# Patient Record
Sex: Male | Born: 1985
Health system: Southern US, Community
[De-identification: ages and names within clinical notes are randomized; demographics above are authoritative.]

## PROBLEM LIST (undated history)

## (undated) DIAGNOSIS — I10 Essential (primary) hypertension: Secondary | ICD-10-CM

## (undated) DIAGNOSIS — F419 Anxiety disorder, unspecified: Secondary | ICD-10-CM

## (undated) DIAGNOSIS — E119 Type 2 diabetes mellitus without complications: Secondary | ICD-10-CM

## (undated) DIAGNOSIS — K219 Gastro-esophageal reflux disease without esophagitis: Secondary | ICD-10-CM

## (undated) DIAGNOSIS — R7303 Prediabetes: Secondary | ICD-10-CM

## (undated) HISTORY — DX: Type 2 diabetes mellitus without complications: E11.9

## (undated) HISTORY — DX: Gastro-esophageal reflux disease without esophagitis: K21.9

## (undated) HISTORY — DX: Anxiety disorder, unspecified: F41.9

## (undated) HISTORY — DX: Prediabetes: R73.03

## (undated) HISTORY — PX: FRACTURE SURGERY: SHX138

---
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
## 2005-12-22 ENCOUNTER — Emergency Department (HOSPITAL_COMMUNITY): Admission: EM | Admit: 2005-12-22 | Discharge: 2005-12-22 | Payer: Self-pay | Admitting: Emergency Medicine

## 2008-02-01 ENCOUNTER — Emergency Department (HOSPITAL_COMMUNITY): Admission: EM | Admit: 2008-02-01 | Discharge: 2008-02-01 | Payer: Self-pay | Admitting: Emergency Medicine

## 2009-04-08 ENCOUNTER — Emergency Department (HOSPITAL_COMMUNITY): Admission: EM | Admit: 2009-04-08 | Discharge: 2009-04-08 | Payer: Self-pay | Admitting: Family Medicine

## 2010-04-30 ENCOUNTER — Emergency Department (HOSPITAL_COMMUNITY): Admission: EM | Admit: 2010-04-30 | Discharge: 2010-04-30 | Payer: Self-pay | Admitting: Emergency Medicine

## 2010-09-07 LAB — URINALYSIS, ROUTINE W REFLEX MICROSCOPIC
Bilirubin Urine: NEGATIVE
Glucose, UA: NEGATIVE mg/dL
Hgb urine dipstick: NEGATIVE
Ketones, ur: NEGATIVE mg/dL
Nitrite: NEGATIVE
Protein, ur: NEGATIVE mg/dL
Specific Gravity, Urine: 1.028 (ref 1.005–1.030)
Urobilinogen, UA: 1 mg/dL (ref 0.0–1.0)
pH: 6 (ref 5.0–8.0)

## 2011-03-25 LAB — POCT URINALYSIS DIP (DEVICE)
Glucose, UA: NEGATIVE
Hgb urine dipstick: NEGATIVE
Ketones, ur: NEGATIVE
Nitrite: NEGATIVE
Operator id: 239701
Protein, ur: NEGATIVE
Specific Gravity, Urine: 1.02
Urobilinogen, UA: 1
pH: 6.5

## 2011-11-25 ENCOUNTER — Encounter (HOSPITAL_COMMUNITY): Payer: Self-pay

## 2011-11-25 ENCOUNTER — Emergency Department (HOSPITAL_COMMUNITY)
Admission: EM | Admit: 2011-11-25 | Discharge: 2011-11-25 | Disposition: A | Discharge status: Home Health | Payer: BC Managed Care – PPO | Source: Home / Self Care | Attending: Emergency Medicine | Admitting: Emergency Medicine

## 2011-11-25 DIAGNOSIS — L739 Follicular disorder, unspecified: Secondary | ICD-10-CM

## 2011-11-25 DIAGNOSIS — L738 Other specified follicular disorders: Secondary | ICD-10-CM

## 2011-11-25 DIAGNOSIS — L678 Other hair color and hair shaft abnormalities: Secondary | ICD-10-CM

## 2011-11-25 HISTORY — DX: Essential (primary) hypertension: I10

## 2011-11-25 MED ORDER — CEPHALEXIN 500 MG PO CAPS: 500.0000 mg | ORAL_CAPSULE | Freq: Four times a day (QID) | ORAL | Status: AC

## 2011-11-25 NOTE — ED Provider Notes (Signed)
History     CSN: [id number]  Arrival date & time 11/25/11  1612   First MD Initiated Contact with Patient 11/25/11 1636      Chief Complaint  Patient presents with  . Rash    (Consider location/radiation/quality/duration/timing/severity/associated sxs/prior treatment) HPI Comments: Patient reports a "bump" on the back of his head starting about a week and half ago after getting his haircut. States it is progressively getting bigger. He keeps his hair very close cut.  No nausea, vomiting, fevers, headache. No redness, drainage from the area. No other rash. He is otherwise healthy, with past history significant for hypertension.  ROS as noted in HPI. All other ROS negative.   Patient is a 26 y.o. male presenting with rash. The history is provided by the patient. No language interpreter was used.  Rash  This is a new problem. The current episode started more than 1 week ago. The problem has not changed since onset.Associated with: after a hairccut. There has been no fever. The rash is present on the scalp. The patient is experiencing no pain. Pertinent negatives include no blisters, no itching, no pain and no weeping. He has tried nothing for the symptoms. The treatment provided no relief.    Past Medical History  Diagnosis Date  . Hypertension   . Seasonal allergies     History reviewed. No pertinent past surgical history.  History reviewed. No pertinent family history.  History  Substance Use Topics  . Smoking status: Current Some Day Smoker  . Smokeless tobacco: Not on file  . Alcohol Use: Yes      Review of Systems  Skin: Positive for rash. Negative for itching.    Allergies  Review of patient's allergies indicates no known allergies.  Home Medications   Current Outpatient Rx  Name Route Sig Dispense Refill  . LISINOPRIL 10 MG PO TABS Oral Take 10 mg by mouth daily.    . CEPHALEXIN 500 MG PO CAPS Oral Take 1 capsule (500 mg total) by mouth 4 (four) times  daily. X 10 days 40 capsule 0    BP 136/73  Pulse 68  Temp(Src) 98.2 F (36.8 C) (Oral)  Resp 16  SpO2 100%  Physical Exam  Nursing note and vitals reviewed. Constitutional: He is oriented to person, place, and time. He appears well-developed and well-nourished.  HENT:  Head: Normocephalic and atraumatic.    Eyes: Conjunctivae and EOM are normal.  Neck: Normal range of motion.  Cardiovascular: Normal rate.   Pulmonary/Chest: Effort normal. No respiratory distress.  Abdominal: He exhibits no distension.  Musculoskeletal: Normal range of motion.  Lymphadenopathy:    He has no cervical adenopathy.       Right cervical: No posterior cervical adenopathy present.      Left cervical: No posterior cervical adenopathy present.  Neurological: He is alert and oriented to person, place, and time.  Skin: Skin is warm and dry.  Psychiatric: He has a normal mood and affect. His behavior is normal.    ED Course  Procedures (including critical care time)  Labs Reviewed - No data to display No results found.   1. Folliculitis     MDM  No evidence of cellulitis. H&P most consistent with a mild folliculitis. Will have him start warm compresses, bacitracin, Keflex. He'll follow up with Dr. Renae Gloss his PMD, PRN  Luiz Blare, MD 11/25/11 Rickey Primus

## 2011-11-25 NOTE — ED Notes (Signed)
Pt c/o bump to back of head.  Pt states onset 1.5 weeks ago.  Pt denies pain, drainage.

## 2011-11-25 NOTE — Discharge Instructions (Signed)
Folliculitis  °Folliculitis is an infection and inflammation of the hair follicles. Hair follicles become red and irritated. This inflammation is usually caused by bacteria. The bacteria thrive in warm, moist environments. This condition can be seen anywhere on the body.  °CAUSES °The most common cause of folliculitis is an infection by germs (bacteria). Fungal and viral infections can also cause the condition. Viral infections may be more common in people whose bodies are unable to fight disease well (weakened immune systems). Examples include people with: °· AIDS.  °· An organ transplant.  °· Cancer.  °People with depressed immune systems, diabetes, or obesity, have a greater risk of getting folliculitis than the general population. Certain chemicals, especially oils and tars, also can cause folliculitis. °SYMPTOMS °· An early sign of folliculitis is a small, white or yellow pus-filled, itchy lesion (pustule). These lesions appear on a red, inflamed follicle. They are usually less than 5 mm (.20 inches).  °· The most likely starting points are the scalp, thighs, legs, back and buttocks. Folliculitis is also frequently found in areas of repeated shaving.  °· When an infection of the follicle goes deeper, it becomes a boil or furuncle. A group of closely packed boils create a larger lesion (a carbuncle). These sores (lesions) tend to occur in hairy, sweaty areas of the body.  °TREATMENT  °· A doctor who specializes in skin problems (dermatologists) treats mild cases of folliculitis with antiseptic washes.  °· They also use a skin application which kills germs (topical antibiotics). Tea tree oil is a good topical antiseptic as well. It can be found at a health food store. A small percentage of individuals may develop an allergy to the tea tree oil.  °· Mild to moderate boils respond well to warm water compresses applied three times daily.  °· In some cases, oral antibiotics should be taken with the skin treatment.    °· If lesions contain large quantities of pus or fluid, your caregiver may drain them. This allows the topical antibiotics to get to the affected areas better.  °· Stubborn cases of folliculitis may respond to laser hair removal. This process uses a high intensity light beam (a laser) to destroy the follicle and reduces the scarring from folliculitis. After laser hair removal, hair will no longer grow in the laser treated area.  °Patients with long-lasting folliculitis need to find out where the infection is coming from. Germs can live in the nostrils of the patient. This can trigger an outbreak now and then. Sometimes the bacteria live in the nostrils of a family member. This person does not develop the disorder but they repeatedly re-expose others to the germ. To break the cycle of recurrence in the patient, the family member must also undergo treatment. °PREVENTION  °· Individuals who are predisposed to folliculitis should be extremely careful about personal hygiene.  °· Application of antiseptic washes may help prevent recurrences.  °· A topical antibiotic cream, mupirocin (Bactroban®), has been effective at reducing bacteria in the nostrils. It is applied inside the nose with your little finger. This is done twice daily for a week. Then it is repeated every 6 months.  °· Because follicle disorders tend to come back, patients must receive follow-up care. Your caregiver may be able to recognize a recurrence before it becomes severe.  °SEEK IMMEDIATE MEDICAL CARE IF:  °· You develop redness, swelling, or increasing pain in the area.  °· You have a fever.  °· You are not improving with treatment   or are getting worse.  °· You have any other questions or concerns.  °Document Released: 08/22/2001 Document Revised: 06/02/2011 Document Reviewed: 06/18/2008 °Exit[employer]® Patient Information ©2012 [employer]. °

## 2012-04-10 ENCOUNTER — Encounter (HOSPITAL_COMMUNITY): Payer: Self-pay | Admitting: Emergency Medicine

## 2012-04-10 ENCOUNTER — Emergency Department (INDEPENDENT_AMBULATORY_CARE_PROVIDER_SITE_OTHER)
Admission: EM | Admit: 2012-04-10 | Discharge: 2012-04-10 | Disposition: A | Discharge status: Home / Self Care | Payer: BC Managed Care – PPO | Source: Home / Self Care | Attending: Family Medicine | Admitting: Family Medicine

## 2012-04-10 DIAGNOSIS — J069 Acute upper respiratory infection, unspecified: Secondary | ICD-10-CM

## 2012-04-10 LAB — POCT RAPID STREP A: Streptococcus, Group A Screen (Direct): NEGATIVE

## 2012-04-10 MED ORDER — IPRATROPIUM BROMIDE 0.06 % NA SOLN: 2.0000 | Freq: Four times a day (QID) | NASAL | Status: DC

## 2012-04-10 NOTE — ED Notes (Signed)
Pt  Reports  Congested          Earache   As  Well         Symptoms              X   sev  Days          Pt  Appears  In  No  Acute  Distress        Symptoms  Not  releived by  otc  meds             Pt  Is  Awake  As  Well  As  Alert      As  Well

## 2012-04-10 NOTE — ED Provider Notes (Signed)
History     CSN: [id number]  Arrival date & time 04/10/12  0820   First MD Initiated Contact with Patient 04/10/12 [phone]      Chief Complaint  Patient presents with  . Cough    (Consider location/radiation/quality/duration/timing/severity/associated sxs/prior treatment) Patient is a 26 y.o. male presenting with cough. The history is provided by the patient.  Cough This is a new problem. The current episode started more than 2 days ago. The problem has not changed since onset.The cough is non-productive. Associated symptoms include rhinorrhea. Pertinent negatives include no chest pain, no chills, no myalgias, no shortness of breath and no wheezing. He is not a smoker.    Past Medical History  Diagnosis Date  . Hypertension   . Seasonal allergies     History reviewed. No pertinent past surgical history.  No family history on file.  History  Substance Use Topics  . Smoking status: Current Some Day Smoker  . Smokeless tobacco: Not on file  . Alcohol Use: Yes      Review of Systems  Constitutional: Negative for chills.  HENT: Positive for congestion, rhinorrhea and postnasal drip.   Respiratory: Positive for cough. Negative for shortness of breath and wheezing.   Cardiovascular: Negative for chest pain.  Gastrointestinal: Negative.   Musculoskeletal: Negative for myalgias.    Allergies  Review of patient's allergies indicates no known allergies.  Home Medications   Current Outpatient Rx  Name Route Sig Dispense Refill  . LISINOPRIL 10 MG PO TABS Oral Take 10 mg by mouth daily.    . IPRATROPIUM BROMIDE 0.06 % NA SOLN Nasal Place 2 sprays into the nose 4 (four) times daily. 15 mL 1    BP 145/88  Pulse 72  Temp 98.6 F (37 C) (Oral)  Resp 16  SpO2 99%  Physical Exam  Nursing note and vitals reviewed. Constitutional: He is oriented to person, place, and time. He appears well-developed and well-nourished.  HENT:  Head: Normocephalic.  Right Ear: External  ear normal.  Left Ear: External ear normal.  Nose: Mucosal edema and rhinorrhea present.  Mouth/Throat: Oropharynx is clear and moist.  Eyes: Pupils are equal, round, and reactive to light.  Neck: Normal range of motion. Neck supple.  Cardiovascular: Normal rate, normal heart sounds and intact distal pulses.   Pulmonary/Chest: Effort normal and breath sounds normal.  Neurological: He is alert and oriented to person, place, and time.  Skin: Skin is warm and dry.    ED Course  Procedures (including critical care time)   Labs Reviewed  POCT RAPID STREP A ([facility] ONLY)   No results found.   1. URI (upper respiratory infection)       MDM          Linna Hoff, MD 04/10/12 0900

## 2012-10-18 ENCOUNTER — Ambulatory Visit (INDEPENDENT_AMBULATORY_CARE_PROVIDER_SITE_OTHER): Payer: BC Managed Care – PPO | Admitting: Emergency Medicine

## 2012-10-18 VITALS — BP 128/70 | HR 70 | Temp 98.6°F | Resp 18 | Ht 70.0 in | Wt 252.0 lb

## 2012-10-18 DIAGNOSIS — R112 Nausea with vomiting, unspecified: Secondary | ICD-10-CM

## 2012-10-18 DIAGNOSIS — R197 Diarrhea, unspecified: Secondary | ICD-10-CM

## 2012-10-18 DIAGNOSIS — E860 Dehydration: Secondary | ICD-10-CM

## 2012-10-18 LAB — POCT CBC
Granulocyte percent: 86.7 % — AB (ref 37–80)
HCT, POC: 43.1 % — AB (ref 43.5–53.7)
Hemoglobin: 13.4 g/dL — AB (ref 14.1–18.1)
Lymph, poc: 0.6 (ref 0.6–3.4)
MCH, POC: 22 pg — AB (ref 27–31.2)
MCHC: 31.14 g/dL — AB (ref 31.8–35.4)
MCV: 70.9 fL — AB (ref 80–97)
MID (cbc): 0.2 (ref 0–0.9)
MPV: 9.3 fL (ref 0–99.8)
POC Granulocyte: 5.1 (ref 2–6.9)
POC LYMPH PERCENT: 10.1 % (ref 10–50)
POC MID %: 3.2 % (ref 0–12)
Platelet Count, POC: 295 K/uL (ref 142–424)
RBC: 6.08 M/uL (ref 4.69–6.13)
RDW, POC: 15 %
WBC: 5.9 K/uL (ref 4.6–10.2)

## 2012-10-18 MED ORDER — ONDANSETRON 4 MG PO TBDP
8.0000 mg | ORAL_TABLET | Freq: Once | ORAL | Status: AC
  Administered 2012-10-18: 8 mg via ORAL

## 2012-10-18 MED ORDER — ONDANSETRON 8 MG PO TBDP: 8.0000 mg | ORAL_TABLET | Freq: Three times a day (TID) | ORAL | Status: DC | PRN

## 2012-10-18 NOTE — Progress Notes (Signed)
  Subjective:    Patient ID: Ryan Rowe, male    DOB: 29-Apr-1986, 27 y.o.   MRN: [id number]  HPI 27 yo male with diarrhea and vomiting starting yesterday. Had diarrhea 4 times, all liquid no blood. Has vomiting a couple times, most recently here in the office. No blood in the vomit. Feels dizzy and weak. 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 Has not tried eating. Has not been able to keep liquids down. Has tried taking Prilosec and Tums, which did nothing.    Review of Systems  Constitutional: Positive for fatigue. Negative for fever and chills.  Gastrointestinal: Positive for nausea, vomiting, abdominal pain and diarrhea. Negative for blood in stool.       Objective:   Physical Exam  Constitutional: He is oriented to person, place, and time. He appears well-developed and well-nourished.  HENT:  Head: Normocephalic and atraumatic.  Cardiovascular: Normal rate, regular rhythm and normal heart sounds.   Pulmonary/Chest: Effort normal and breath sounds normal.  Abdominal: Soft. There is tenderness (worst in epigastric area). There is no rebound and no guarding.  Hyperactive bowel sounds  Neurological: He is alert and oriented to person, place, and time.  Skin: Skin is warm and dry.    Orthostatics: Laying: 128/82 P: 70           Sitting: 126/78 P: 75            Standing: 118/84 P: 87     Results for orders placed in visit on 10/18/12  POCT CBC      Result Value Range   WBC 5.9  4.6 - 10.2 K/uL   Lymph, poc 0.6  0.6 - 3.4   POC LYMPH PERCENT 10.1  10 - 50 %L   MID (cbc) 0.2  0 - 0.9   POC MID % 3.2  0 - 12 %M   POC Granulocyte 5.1  2 - 6.9   Granulocyte percent 86.7 (*) 37 - 80 %G   RBC 6.08  4.69 - 6.13 M/uL   Hemoglobin 13.4 (*) 14.1 - 18.1 g/dL   HCT, POC 32.9 (*) 51.8 - 53.7 %   MCV 70.9 (*) 80 - 97 fL   MCH, POC 22.0 (*) 27 - 31.2 pg   MCHC 31.14 (*) 31.8 - 35.4 g/dL   RDW, POC 84.1     Platelet Count, POC 295  142 - 424 K/uL   MPV 9.3  0 -  99.8 fL      Assessment & Plan:  27 yo  with 24 hours of nausea and vomiting. Likely viral gastroenteritis. Zofran and IV rehydration in the office.

## 2012-10-18 NOTE — Patient Instructions (Signed)

## 2013-06-10 ENCOUNTER — Ambulatory Visit (INDEPENDENT_AMBULATORY_CARE_PROVIDER_SITE_OTHER): Payer: BC Managed Care – PPO | Admitting: Physician Assistant

## 2013-06-10 VITALS — BP 120/78 | HR 84 | Temp 98.3°F | Resp 16 | Ht 69.5 in | Wt 252.0 lb

## 2013-06-10 DIAGNOSIS — J069 Acute upper respiratory infection, unspecified: Secondary | ICD-10-CM

## 2013-06-10 LAB — POCT INFLUENZA A/B
Influenza A, POC: NEGATIVE
Influenza B, POC: NEGATIVE

## 2013-06-10 MED ORDER — GUAIFENESIN ER 1200 MG PO TB12: 1.0000 | ORAL_TABLET | Freq: Two times a day (BID) | ORAL | Status: AC

## 2013-06-10 MED ORDER — HYDROCOD POLST-CHLORPHEN POLST 10-8 MG/5ML PO LQCR: 5.0000 mL | Freq: Two times a day (BID) | ORAL | Status: AC

## 2013-06-10 NOTE — Patient Instructions (Signed)
Push fluids - tylenol/motrin prn pain and headache.  Push fluids.  Do not take sudafed due to high blood pressure.

## 2013-06-10 NOTE — Progress Notes (Signed)
   Subjective:    Patient ID: Ryan Rowe, male    DOB: July 14, 1985, 27 y.o.   MRN: [id number]  HPI Pt presents to clinic with 24h h/o cold symptoms.  He has a productive cough with nasal congestion and PND but no rhinorrhea.  He has headaches and myalgias.    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  HENT: Positive for congestion, postnasal drip and sore throat (mild). Negative for rhinorrhea.   Respiratory: Positive for cough (productive cough with yellow sputum). Negative for shortness of breath and wheezing.   Gastrointestinal: Negative for nausea, vomiting and diarrhea.  Musculoskeletal: Positive for myalgias.  Neurological: Positive for headaches.       Objective:   Physical Exam  Vitals reviewed. Constitutional: He is oriented to person, place, and time. He appears well-developed and well-nourished.  HENT:  Head: Normocephalic and atraumatic.  Right Ear: Hearing, tympanic membrane, external ear and ear canal normal.  Left Ear: Hearing, tympanic membrane, external ear and ear canal normal.  Nose: Mucosal edema (red) present.  Mouth/Throat: Uvula is midline, oropharynx is clear and moist and mucous membranes are normal. No posterior oropharyngeal edema or posterior oropharyngeal erythema.  Eyes: Conjunctivae are normal.  Cardiovascular: Normal rate, regular rhythm and normal heart sounds.   No murmur heard. Pulmonary/Chest: Effort normal and breath sounds normal.  Neurological: He is alert and oriented to person, place, and time.  Skin: Skin is warm and dry.  Psychiatric: He has a normal mood and affect. His behavior is normal. Judgment and thought content normal.       Assessment & Plan:  Viral URI with cough - Plan: Guaifenesin (MUCINEX MAXIMUM STRENGTH) 1200 MG TB12, chlorpheniramine-HYDROcodone (TUSSIONEX PENNKINETIC ER) 10-8 MG/5ML LQCR, POCT  Influenza A/B   Benny Lennert PA-C 06/10/2013 12:02 PM

## 2013-06-11 ENCOUNTER — Telehealth: Payer: Self-pay

## 2013-06-11 ENCOUNTER — Emergency Department (HOSPITAL_COMMUNITY): Payer: BC Managed Care – PPO

## 2013-06-11 ENCOUNTER — Emergency Department (HOSPITAL_COMMUNITY)
Admission: EM | Admit: 2013-06-11 | Discharge: 2013-06-11 | Disposition: A | Discharge status: Home / Self Care | Payer: BC Managed Care – PPO | Attending: Emergency Medicine | Admitting: Emergency Medicine

## 2013-06-11 ENCOUNTER — Encounter (HOSPITAL_COMMUNITY): Payer: Self-pay | Admitting: Emergency Medicine

## 2013-06-11 ENCOUNTER — Ambulatory Visit (INDEPENDENT_AMBULATORY_CARE_PROVIDER_SITE_OTHER): Payer: BC Managed Care – PPO | Admitting: Family Medicine

## 2013-06-11 VITALS — BP 116/75 | HR 94 | Temp 100.5°F | Resp 18 | Ht 69.5 in | Wt 252.0 lb

## 2013-06-11 DIAGNOSIS — R110 Nausea: Secondary | ICD-10-CM | POA: Insufficient documentation

## 2013-06-11 DIAGNOSIS — Z79899 Other long term (current) drug therapy: Secondary | ICD-10-CM | POA: Insufficient documentation

## 2013-06-11 DIAGNOSIS — I10 Essential (primary) hypertension: Secondary | ICD-10-CM | POA: Insufficient documentation

## 2013-06-11 DIAGNOSIS — J111 Influenza due to unidentified influenza virus with other respiratory manifestations: Secondary | ICD-10-CM | POA: Insufficient documentation

## 2013-06-11 DIAGNOSIS — R55 Syncope and collapse: Secondary | ICD-10-CM

## 2013-06-11 DIAGNOSIS — K219 Gastro-esophageal reflux disease without esophagitis: Secondary | ICD-10-CM | POA: Insufficient documentation

## 2013-06-11 DIAGNOSIS — R404 Transient alteration of awareness: Secondary | ICD-10-CM | POA: Insufficient documentation

## 2013-06-11 DIAGNOSIS — F17200 Nicotine dependence, unspecified, uncomplicated: Secondary | ICD-10-CM | POA: Insufficient documentation

## 2013-06-11 DIAGNOSIS — J029 Acute pharyngitis, unspecified: Secondary | ICD-10-CM | POA: Insufficient documentation

## 2013-06-11 LAB — URINALYSIS, ROUTINE W REFLEX MICROSCOPIC
Bilirubin Urine: NEGATIVE
Glucose, UA: NEGATIVE mg/dL
Hgb urine dipstick: NEGATIVE
Ketones, ur: NEGATIVE mg/dL
Leukocytes, UA: NEGATIVE
Nitrite: NEGATIVE
Protein, ur: NEGATIVE mg/dL
Specific Gravity, Urine: 1.023 (ref 1.005–1.030)
Urobilinogen, UA: 0.2 mg/dL (ref 0.0–1.0)
pH: 6 (ref 5.0–8.0)

## 2013-06-11 LAB — COMPREHENSIVE METABOLIC PANEL
ALT: 19 U/L (ref 0–53)
AST: 19 U/L (ref 0–37)
Albumin: 3.9 g/dL (ref 3.5–5.2)
Alkaline Phosphatase: 48 U/L (ref 39–117)
BUN: 15 mg/dL (ref 6–23)
CO2: 26 mEq/L (ref 19–32)
Calcium: 9 mg/dL (ref 8.4–10.5)
Chloride: 97 mEq/L (ref 96–112)
Creatinine, Ser: 1.12 mg/dL (ref 0.50–1.35)
GFR calc Af Amer: >90 mL/min (ref >90)
GFR calc non Af Amer: 89 mL/min — ABNORMAL LOW (ref >90)
Glucose, Bld: 110 mg/dL — ABNORMAL HIGH (ref 70–99)
Potassium: 4 mEq/L (ref 3.5–5.1)
Sodium: 134 mEq/L — ABNORMAL LOW (ref 135–145)
Total Bilirubin: 0.3 mg/dL (ref 0.3–1.2)
Total Protein: 7.4 g/dL (ref 6.0–8.3)

## 2013-06-11 LAB — CBC WITH DIFFERENTIAL/PLATELET
Basophils Absolute: 0 10*3/uL (ref 0.0–0.1)
Basophils Relative: 0 % (ref 0–1)
Eosinophils Absolute: 0 10*3/uL (ref 0.0–0.7)
Eosinophils Relative: 0 % (ref 0–5)
HCT: 39.7 % (ref 39.0–52.0)
Hemoglobin: 12.6 g/dL — ABNORMAL LOW (ref 13.0–17.0)
Lymphocytes Relative: 10 % — ABNORMAL LOW (ref 12–46)
Lymphs Abs: 0.6 10*3/uL — ABNORMAL LOW (ref 0.7–4.0)
MCH: 21.8 pg — ABNORMAL LOW (ref 26.0–34.0)
MCHC: 31.7 g/dL (ref 30.0–36.0)
MCV: 68.7 fL — ABNORMAL LOW (ref 78.0–100.0)
Monocytes Absolute: 0.8 10*3/uL (ref 0.1–1.0)
Monocytes Relative: 13 % — ABNORMAL HIGH (ref 3–12)
Neutro Abs: 5 10*3/uL (ref 1.7–7.7)
Neutrophils Relative %: 77 % (ref 43–77)
Platelets: 195 10*3/uL (ref 150–400)
RBC: 5.78 MIL/uL (ref 4.22–5.81)
RDW: 14.3 % (ref 11.5–15.5)
WBC: 6.4 10*3/uL (ref 4.0–10.5)

## 2013-06-11 LAB — GLUCOSE, POCT (MANUAL RESULT ENTRY): POC Glucose: 107 mg/dl — AB (ref 70–99)

## 2013-06-11 MED ORDER — KETOROLAC TROMETHAMINE 30 MG/ML IJ SOLN
30.0000 mg | Freq: Once | INTRAMUSCULAR | Status: AC
  Administered 2013-06-11: 30 mg via INTRAVENOUS
  Filled 2013-06-11: qty 1

## 2013-06-11 MED ORDER — IBUPROFEN 800 MG PO TABS: 800.0000 mg | ORAL_TABLET | Freq: Three times a day (TID) | ORAL | Status: DC | PRN

## 2013-06-11 MED ORDER — ACETAMINOPHEN-CODEINE 120-12 MG/5ML PO SOLN: 10.0000 mL | ORAL | Status: DC | PRN

## 2013-06-11 MED ORDER — SODIUM CHLORIDE 0.9 % IV BOLUS (SEPSIS)
2000.0000 mL | Freq: Once | INTRAVENOUS | Status: AC
  Administered 2013-06-11: 2000 mL via INTRAVENOUS

## 2013-06-11 MED ORDER — ACETAMINOPHEN 325 MG PO TABS
650.0000 mg | ORAL_TABLET | Freq: Once | ORAL | Status: AC
  Administered 2013-06-11: 650 mg via ORAL
  Filled 2013-06-11: qty 2

## 2013-06-11 MED ORDER — GUAIFENESIN ER 1200 MG PO TB12: 1.0000 | ORAL_TABLET | Freq: Two times a day (BID) | ORAL | Status: DC

## 2013-06-11 NOTE — Progress Notes (Addendum)
[facility] [address] [place] [address] [phone]  Date:  06/11/2013   Name:  Ryan Rowe   DOB:  October 13, 1985   MRN:  [id number]  PCP:  No primary provider on file.    Chief Complaint: Cough, Fever and Fatigue   History of Present Illness:  Ryan Rowe is a 27 y.o. very pleasant male patient who presents with the following:  Brought back urgently after LOC in the waiting room.  No seizure activity observed by staff.   He was found somnolent and unresponsive but sitting in a chair.  Sternal rub caused him to wake up and follow some commands.  Transferred back to room, started IV in right [facility] with NS wide open. Glucose check 107.    Once supine he felt better and responded, was able to talk.  Reports he is not really sure what happened, he just felt "like I was dreaming" and the next thin he knew he was laying down on exam table.  He was sweaty.  Denies any chest pain.  He has been ill with cough and fever recently.    Noted to have pulse of 55, BP approx 100/60 on initial exam in room Patient Active Problem List   Diagnosis Date Noted  . HTN (hypertension) 06/10/2013    Past Medical History  Diagnosis Date  . Hypertension   . Seasonal allergies   . GERD (gastroesophageal reflux disease)     Past Surgical History  Procedure Laterality Date  . Fracture surgery      History  Substance Use Topics  . Smoking status: Current Some Day Smoker  . Smokeless tobacco: Not on file  . Alcohol Use: Yes    No family history on file.  No Known Allergies  Medication list has been reviewed and updated.  Current Outpatient Prescriptions on File Prior to Visit  Medication Sig Dispense Refill  . chlorpheniramine-HYDROcodone (TUSSIONEX PENNKINETIC ER) 10-8 MG/5ML LQCR Take 5 mLs by mouth every 12 (twelve) hours.  70 mL  0  . Guaifenesin (MUCINEX MAXIMUM STRENGTH) 1200 MG TB12 Take 1 tablet (1,200 mg total) by mouth 2 (two) times daily.  14 each  0  .  lisinopril (PRINIVIL,ZESTRIL) 10 MG tablet Take 10 mg by mouth daily.      Marland Kitchen omeprazole (PRILOSEC) 20 MG capsule Take 20 mg by mouth daily.       No current facility-administered medications on file prior to visit.    Review of Systems:  As per HPI- otherwise negative.   Physical Examination: Filed Vitals:   06/11/13 1648  BP: 116/75  Pulse: 94  Temp: 100.5 F (38.1 C)  Resp: 18   Filed Vitals:   06/11/13 1648  Height: 5' 9.5" (1.765 m)  Weight: 252 lb (114.306 kg)   Body mass index is 36.69 kg/(m^2). Ideal Body Weight: Weight in (lb) to have BMI = 25: 171.4  GEN: WDWN, NAD, Non-toxic, Alert and oriented once supine, overweight, sweating HEENT: Atraumatic, Normocephalic. Neck supple. No masses, No LAD. Ears and Nose: No external deformity. CV: RRR, No M/G/R. No JVD. No thrill. No extra heart sounds. PULM: CTA B, no wheezes, crackles, rhonchi. No retractions. No resp. distress. No accessory muscle use. ABD: S, NT, ND. No rebound. No HSM. EXTR: No c/c/e NEURO moved in [facility] PSYCH: as per HPI    Assessment and Plan: Syncope  Syncope and collapse, likely due to bradycardia/ hypotension, perhaps related to illness.  EMS called immediately  for transfer to hospital for further evaluation and hydration    12 lead EKG shows NSR with rate of 79 per EMS.    Signed Abbe Amsterdam, MD

## 2013-06-11 NOTE — Addendum Note (Signed)
Addended by: Johnnette Litter on: 06/11/2013 06:03 PM   Modules accepted: Orders

## 2013-06-11 NOTE — ED Notes (Signed)
Bed: WA21 Expected date:  Expected time:  Means of arrival:  Comments: EMS-N/V-cold symptoms

## 2013-06-11 NOTE — ED Provider Notes (Signed)
CSN: [id number]     Arrival date & time 06/11/13  1803 History   First MD Initiated Contact with Patient 06/11/13 1816     Chief Complaint  Patient presents with  . Nausea  . Loss of Consciousness   (Consider location/radiation/quality/duration/timing/severity/associated sxs/prior Treatment) HPI Patient presents to the emergency department following a brief syncopal episode that occurred while he was at an urgent care center.  The patient, states, that he's had cough, runny nose, sore throat, and body aches, and fever.  The patient, states she's not had chest pain, shortness of breath, nausea, vomiting, diarrhea, back pain, neck pain, rash, blurred vision, dizziness, or weakness.  Patient, states, that he did have some mild nausea, and sweating, right before he passed out.  Patient, states he tried some over-the-counter cough, and cold remedy with minimal relief. Past Medical History  Diagnosis Date  . Hypertension   . Seasonal allergies   . GERD (gastroesophageal reflux disease)    Past Surgical History  Procedure Laterality Date  . Fracture surgery     No family history on file. History  Substance Use Topics  . Smoking status: Current Some Day Smoker  . Smokeless tobacco: Not on file  . Alcohol Use: Yes    Review of Systems All other systems negative except as documented in the HPI. All pertinent positives and negatives as reviewed in the HPI. Allergies  Review of patient's allergies indicates no known allergies.  Home Medications   Current Outpatient Rx  Name  Route  Sig  Dispense  Refill  . chlorpheniramine-HYDROcodone (TUSSIONEX PENNKINETIC ER) 10-8 MG/5ML LQCR   Oral   Take 5 mLs by mouth every 12 (twelve) hours.   70 mL   0   . Guaifenesin (MUCINEX MAXIMUM STRENGTH) 1200 MG TB12   Oral   Take 1 tablet (1,200 mg total) by mouth 2 (two) times daily.   14 each   0   . lisinopril (PRINIVIL,ZESTRIL) 10 MG tablet   Oral   Take 10 mg by mouth daily.         Marland Kitchen  omeprazole (PRILOSEC) 20 MG capsule   Oral   Take 20 mg by mouth daily.          BP 122/74  Pulse 88  Temp(Src) 100.2 F (37.9 C) (Oral)  Resp 19  SpO2 100% Physical Exam  Nursing note and vitals reviewed. Constitutional: He is oriented to person, place, and time. He appears well-developed and well-nourished. No distress.  HENT:  Head: Normocephalic and atraumatic.  Mouth/Throat: Oropharynx is clear and moist. No oropharyngeal exudate.  Eyes: EOM are normal. Pupils are equal, round, and reactive to light.  Neck: Normal range of motion. Neck supple.  Cardiovascular: Normal rate, regular rhythm and normal heart sounds.  Exam reveals no gallop and no friction rub.   No murmur heard. Pulmonary/Chest: Effort normal and breath sounds normal. No respiratory distress.  Abdominal: Soft. Bowel sounds are normal. He exhibits no distension. There is no tenderness. There is no rebound and no guarding.  Musculoskeletal: He exhibits no edema.  Neurological: He is alert and oriented to person, place, and time. He exhibits normal muscle tone. Coordination normal.  Skin: Skin is warm and dry. No rash noted.    ED Course  Procedures (including critical care time) Labs Review Labs Reviewed  CBC WITH DIFFERENTIAL - Abnormal; Notable for the following:    Hemoglobin 12.6 (*)    MCV 68.7 (*)    MCH 21.8 (*)  Lymphocytes Relative 10 (*)    Monocytes Relative 13 (*)    Lymphs Abs 0.6 (*)    All other components within normal limits  COMPREHENSIVE METABOLIC PANEL - Abnormal; Notable for the following:    Sodium 134 (*)    Glucose, Bld 110 (*)    GFR calc non Af Amer 89 (*)    All other components within normal limits  URINALYSIS, ROUTINE W REFLEX MICROSCOPIC   Imaging Review Dg Chest 2 View  06/11/2013   CLINICAL DATA:  Cough.  EXAM: CHEST  2 VIEW  COMPARISON:  None.  FINDINGS: The heart size and mediastinal contours are within normal limits. Both lungs are clear. The visualized skeletal  structures are unremarkable.  IMPRESSION: No active cardiopulmonary disease.   Electronically Signed   By: Myles Rosenthal M.D.   On: 06/11/2013 19:27    EKG Interpretation    Date/Time:  Tuesday June 11 2013 18:23:33 EST Ventricular Rate:  83 PR Interval:  191 QRS Duration: 80 QT Interval:  327 QTC Calculation: 384 R Axis:   68 Text Interpretation:  Sinus rhythm No significant change since last tracing Confirmed by HORTON  MD, Toni Amend ([id number]) on 06/11/2013 6:45:42 PM           Patient's syncope is most likely due to his viral influenza-like illness.  Patient does not have any significant lab abnormalities.  Patient will be treated for this influenza-like illness.  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   Patient is given his laboratory test results, and all questions were answered.  The patient is advised not to return to work until he has no fever, any longer.  Carlyle Dolly, PA-C 06/11/13 2106

## 2013-06-11 NOTE — ED Notes (Signed)
Per EMS: pt was at [facility] for cough, had syncopal episode lasting about about 2 mins. Denies pain, states he feels dizzy and drained.

## 2013-06-11 NOTE — ED Provider Notes (Signed)
Medical screening examination/treatment/procedure(s) were performed by non-physician practitioner and as supervising physician I was immediately available for consultation/collaboration.  EKG Interpretation    Date/Time:  Tuesday June 11 2013 18:23:33 EST Ventricular Rate:  83 PR Interval:  191 QRS Duration: 80 QT Interval:  327 QTC Calculation: 384 R Axis:   68 Text Interpretation:  Sinus rhythm No significant change since last tracing Confirmed by Wilkie Aye  MD, Zakiya Sporrer ([id number]) on 06/11/2013 6:45:42 PM             Shon Baton, MD 06/11/13 2329

## 2013-06-17 ENCOUNTER — Ambulatory Visit (INDEPENDENT_AMBULATORY_CARE_PROVIDER_SITE_OTHER): Payer: BC Managed Care – PPO | Admitting: Emergency Medicine

## 2013-06-17 VITALS — BP 132/90 | HR 78 | Temp 98.0°F | Resp 16 | Ht 70.0 in | Wt 254.0 lb

## 2013-06-17 DIAGNOSIS — R55 Syncope and collapse: Secondary | ICD-10-CM

## 2013-06-17 DIAGNOSIS — R51 Headache: Secondary | ICD-10-CM

## 2013-06-17 NOTE — Progress Notes (Signed)
   Subjective:    Patient ID: Ryan Rowe, male    DOB: Jan 27, 1986, 27 y.o.   MRN: [id number]  HPI    Review of Systems     Objective:   Physical Exam        Assessment & Plan:

## 2013-06-17 NOTE — Progress Notes (Signed)
Subjective:    Patient ID: Ryan Rowe, male    DOB: March 21, 1986, 27 y.o.   MRN: [id number]  HPI  This chart was scribed for Ryan Spare A. Cleta Alberts, MD, by Ellin Mayhew, ED Scribe. This patient was seen in room 11 and the patient's care was started at 2:07 PM.  HPI Comments: Ryan Rowe is a 27 y.o. male who presents to the [facility] complaining of constant HA that began six days ago. He rates his pain currently as a 4/10. He has had chills at night and intermittent cough. He denies any fever. The patient was last seen on 06/11/2013 for flu-like symptoms and a syncopal episode. He recovered quickly after receiving fluids. He states he has been feeling much better overall since feeling sick with the cold eight days ago. Patient has been having difficulty going to work due to his HAs and was requested to f/u with a physician prior to returning, due to his syncopal episode. He denies any head trauma. He is currently taking ibuprofen and acetaminophen. Patient has a history of HTN and seasonal allergies.   Past Medical History  Diagnosis Date  . Hypertension   . Seasonal allergies   . GERD (gastroesophageal reflux disease)     Past Surgical History  Procedure Laterality Date  . Fracture surgery      History reviewed. No pertinent family history.  History   Social History  . Marital Status: Single    Spouse Name: N/A    Number of Children: N/A  . Years of Education: N/A   Occupational History  . Not on file.   Social History Main Topics  . Smoking status: Never Smoker   . Smokeless tobacco: Not on file  . Alcohol Use: Yes  . Drug Use: No  . Sexual Activity: Yes   Other Topics Concern  . Not on file   Social History Narrative  . No narrative on file    No Known Allergies  Patient Active Problem List   Diagnosis Date Noted  . HTN (hypertension) 06/10/2013    Results for orders placed during the hospital encounter of 06/11/13  CBC WITH DIFFERENTIAL     Result Value Range   WBC 6.4  4.0 - 10.5 K/uL   RBC 5.78  4.22 - 5.81 MIL/uL   Hemoglobin 12.6 (*) 13.0 - 17.0 g/dL   HCT 40.9  81.1 - 91.4 %   MCV 68.7 (*) 78.0 - 100.0 fL   MCH 21.8 (*) 26.0 - 34.0 pg   MCHC 31.7  30.0 - 36.0 g/dL   RDW 78.2  95.6 - 21.3 %   Platelets 195  150 - 400 K/uL   Neutrophils Relative % 77  43 - 77 %   Lymphocytes Relative 10 (*) 12 - 46 %   Monocytes Relative 13 (*) 3 - 12 %   Eosinophils Relative 0  0 - 5 %   Basophils Relative 0  0 - 1 %   Neutro Abs 5.0  1.7 - 7.7 K/uL   Lymphs Abs 0.6 (*) 0.7 - 4.0 K/uL   Monocytes Absolute 0.8  0.1 - 1.0 K/uL   Eosinophils Absolute 0.0  0.0 - 0.7 K/uL   Basophils Absolute 0.0  0.0 - 0.1 K/uL   Smear Review MORPHOLOGY UNREMARKABLE    COMPREHENSIVE METABOLIC PANEL      Result Value Range   Sodium 134 (*) 135 - 145 mEq/L   Potassium 4.0  3.5 - 5.1  mEq/L   Chloride 97  96 - 112 mEq/L   CO2 26  19 - 32 mEq/L   Glucose, Bld 110 (*) 70 - 99 mg/dL   BUN 15  6 - 23 mg/dL   Creatinine, Ser 9.60  0.50 - 1.35 mg/dL   Calcium 9.0  8.4 - 45.4 mg/dL   Total Protein 7.4  6.0 - 8.3 g/dL   Albumin 3.9  3.5 - 5.2 g/dL   AST 19  0 - 37 U/L   ALT 19  0 - 53 U/L   Alkaline Phosphatase 48  39 - 117 U/L   Total Bilirubin 0.3  0.3 - 1.2 mg/dL   GFR calc non Af Amer 89 (*) >90 mL/min   GFR calc Af Amer >90  >90 mL/min  URINALYSIS, ROUTINE W REFLEX MICROSCOPIC      Result Value Range   Color, Urine YELLOW  YELLOW   APPearance CLEAR  CLEAR   Specific Gravity, Urine 1.023  1.005 - 1.030   pH 6.0  5.0 - 8.0   Glucose, UA NEGATIVE  NEGATIVE mg/dL   Hgb urine dipstick NEGATIVE  NEGATIVE   Bilirubin Urine NEGATIVE  NEGATIVE   Ketones, ur NEGATIVE  NEGATIVE mg/dL   Protein, ur NEGATIVE  NEGATIVE mg/dL   Urobilinogen, UA 0.2  0.0 - 1.0 mg/dL   Nitrite NEGATIVE  NEGATIVE   Leukocytes, UA NEGATIVE  NEGATIVE    No diagnosis found.  No orders of the defined types were placed in this encounter.    BP 132/90  Pulse 78   Temp(Src) 98 F (36.7 C) (Oral)  Resp 16  Ht 5\' 10"  (1.778 m)  Wt 254 lb (115.214 kg)  BMI 36.45 kg/m2  SpO2 97%   Review of Systems  Constitutional: Positive for chills. Negative for fever.  Respiratory: Positive for cough.   Neurological: Positive for headaches.   A complete 10 system review of systems was obtained and all systems are negative except as noted in the HPI and PMH.    Objective:   Physical Exam  Nursing note and vitals reviewed. Constitutional: He is oriented to person, place, and time. He appears well-developed and well-nourished.  HENT:  Head: Normocephalic and atraumatic.  Eyes: Conjunctivae are normal. Right eye exhibits no discharge. Left eye exhibits no discharge.  Neck: Normal range of motion.  Cardiovascular: Normal rate.   Pulmonary/Chest: Effort normal. No respiratory distress.  Neurological: He is alert and oriented to person, place, and time.  Skin: Skin is warm and dry.  Psychiatric: He has a normal mood and affect. His behavior is normal.      Assessment & Plan:   Patient looks good he still has a headache. We'll leave him out of work for a few more days

## 2013-09-05 NOTE — Telephone Encounter (Signed)
ERROR

## 2014-02-07 ENCOUNTER — Telehealth: Payer: Self-pay | Admitting: Medical

## 2014-02-07 NOTE — Telephone Encounter (Signed)
DONE

## 2014-02-07 NOTE — Telephone Encounter (Signed)
New pt records received from [facility]. Sending  back for review.

## 2014-02-10 ENCOUNTER — Ambulatory Visit: Payer: BC Managed Care – PPO | Admitting: Medical

## 2014-02-21 ENCOUNTER — Encounter: Payer: Self-pay | Admitting: Medical

## 2014-02-21 ENCOUNTER — Ambulatory Visit (INDEPENDENT_AMBULATORY_CARE_PROVIDER_SITE_OTHER): Payer: BC Managed Care – PPO | Admitting: Medical

## 2014-02-21 VITALS — BP 122/70 | HR 60 | Temp 97.9°F | Resp 16 | Ht 71.0 in | Wt 265.0 lb

## 2014-02-21 DIAGNOSIS — R0989 Other specified symptoms and signs involving the circulatory and respiratory systems: Secondary | ICD-10-CM

## 2014-02-21 DIAGNOSIS — G4710 Hypersomnia, unspecified: Secondary | ICD-10-CM

## 2014-02-21 DIAGNOSIS — K219 Gastro-esophageal reflux disease without esophagitis: Secondary | ICD-10-CM

## 2014-02-21 DIAGNOSIS — Z23 Encounter for immunization: Secondary | ICD-10-CM

## 2014-02-21 DIAGNOSIS — R400 Somnolence: Secondary | ICD-10-CM

## 2014-02-21 DIAGNOSIS — R0683 Snoring: Secondary | ICD-10-CM

## 2014-02-21 DIAGNOSIS — R0609 Other forms of dyspnea: Secondary | ICD-10-CM

## 2014-02-21 DIAGNOSIS — G478 Other sleep disorders: Secondary | ICD-10-CM

## 2014-02-21 DIAGNOSIS — R7301 Impaired fasting glucose: Secondary | ICD-10-CM

## 2014-02-21 DIAGNOSIS — I10 Essential (primary) hypertension: Secondary | ICD-10-CM

## 2014-02-21 NOTE — Progress Notes (Signed)
   Subjective:    Patient ID: Ryan Rowe, male    DOB: 10/10/1985, 28 y.o.   MRN: [id number]  HPI  New patient today.   Was seeing Urgent Care prior.   Referred by Dr. Cleta Alberts.  He reports hx/o HTN x 3 years.  Suppose to be on Lisinopril  currently.  Not taking any BP meds for last 2-3 months.  Has been on medication most of the 3 years he has had the diagnosis.   At diagnosis was not eating healthy, but has much improved on this.  Exercises regularly, eats healthy.   On medication for GERD which works well.    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  Hx/o impaired fasting glucose.   Review of Systems     Objective:   Physical Exam  BP 122/70  Pulse 60  Temp(Src) 97.9 F (36.6 C) (Oral)  Resp 16  Ht  (1.803 m)  Wt 265 lb (120.203 kg)  BMI 36.98 kg/m2  General appearance: alert, no distress, WD/WN, muscular AA male Oral cavity: MMM, no lesions Neck: supple, no lymphadenopathy, no thyromegaly, no masses Heart: RRR, normal S1, S2, no murmurs Lungs: CTA bilaterally, no wheezes, rhonchi, or rales Abdomen: +bs, soft, non tender, non distended, no masses, no hepatomegaly, no splenomegaly Pulses: 2+ symmetric, upper and lower extremities, normal cap refill Ext: no edema Neuro: nonfocal exam      Assessment & Plan:   Encounter Diagnoses  Name Primary?  . Essential hypertension, benign Yes  . Impaired fasting blood sugar   . Gastroesophageal reflux disease without esophagitis   . Need for prophylactic vaccination and inoculation against influenza   . Snoring   . Daytime somnolence   . Non-restorative sleep    HTN - hx/o, but normal BP today, and he has been out of medication x 5mo.  He will keep a BP diary and return this in 67mo.   Labs today.  Discussed causes of hypertension, secondary HTN, complications, treatment including healthy diet, exercise, salt avoidance, and may need to reconsider medication if BPs  staying elevated in 67mo.  Impaired fasting glucose - hx/o. Labs today  GERD - c/t current medication  Counseled on the influenza virus vaccine.  Vaccine information sheet given.  Influenza vaccine given after consent obtained.  Snoring, daytime somnolence, non restorative sleep - he will complete Epworth questionnaire.   Consider sleep study going forward.

## 2014-02-21 NOTE — Patient Instructions (Signed)
Thank you for giving me the opportunity to serve you today.    Your diagnosis today includes: Encounter Diagnoses  Name Primary?  . Essential hypertension, benign Yes  . Need for prophylactic vaccination and inoculation against influenza   . Impaired fasting blood sugar   . Gastroesophageal reflux disease without esophagitis      Specific recommendations today include:  Check your blood pressure readings for the next month and get these back to me  The goal blood pressure is around 120/80.  If you consistently see the top number over 140, or the bottom number over 90, then this is too high, needing medication to control.  we will call with lab results  Work on getting good water intake, healthy diet, routine exercise, and good sleep  Please schedule a visit with an eye doctor for baseline screening   I have included other useful information below for your review.   Ophthalmology Dr. Glenford Peers [address] [place], [place] [address] [phone]   [facility] Dr. Gelene Mink [address] [place] [place], [place] [address]  [phone] [email or web address]   Vincenza Hews, M.D. Susanne Greenhouse, O.D. [address], [address] [place], [place] [address] Medical telephone: [phone] Optical telephone: [phone]   Hypertension Hypertension, commonly called high blood pressure, is when the force of blood pumping through your arteries is too strong. Your arteries are the blood vessels that carry blood from your heart throughout your body. A blood pressure reading consists of a higher number over a lower number, such as 110/72. The higher number (systolic) is the pressure inside your arteries when your heart pumps. The lower number (diastolic) is the pressure inside your arteries when your heart relaxes. Ideally you want your blood pressure below 120/80. Hypertension forces your heart to work harder to pump blood. Your arteries may become  narrow or stiff. Having hypertension puts you at risk for heart disease, stroke, and other problems.  RISK FACTORS Some risk factors for high blood pressure are controllable. Others are not.  Risk factors you cannot control include:   Race. You may be at higher risk if you are African American.  Age. Risk increases with age.  Gender. Men are at higher risk than women before age 26 years. After age 14, women are at higher risk than men. Risk factors you can control include:  Not getting enough exercise or physical activity.  Being overweight.  Getting too much fat, sugar, calories, or salt in your diet.  Drinking too much alcohol. SIGNS AND SYMPTOMS Hypertension does not usually cause signs or symptoms. Extremely high blood pressure (hypertensive crisis) may cause headache, anxiety, shortness of breath, and nosebleed. DIAGNOSIS  To check if you have hypertension, your health care provider will measure your blood pressure while you are seated, with your arm held at the level of your heart. It should be measured at least twice using the same arm. Certain conditions can cause a difference in blood pressure between your right and left arms. A blood pressure reading that is higher than normal on one occasion does not mean that you need treatment. If one blood pressure reading is high, ask your health care provider about having it checked again. TREATMENT  Treating high blood pressure includes making lifestyle changes and possibly taking medicine. Living a healthy lifestyle can help lower high blood pressure. You may need to change some of your habits. Lifestyle changes may include:  Following the DASH diet. This diet  is high in fruits, vegetables, and whole grains. It is low in salt, red meat, and added sugars.  Getting at least 2 hours of brisk physical activity every week.  Losing weight if necessary.  Not smoking.  Limiting alcoholic beverages.  Learning ways to reduce stress. If  lifestyle changes are not enough to get your blood pressure under control, your health care provider may prescribe medicine. You may need to take more than one. Work closely with your health care provider to understand the risks and benefits. HOME CARE INSTRUCTIONS  Have your blood pressure rechecked as directed by your health care provider.   Take medicines only as directed by your health care provider. Follow the directions carefully. Blood pressure medicines must be taken as prescribed. The medicine does not work as well when you skip doses. Skipping doses also puts you at risk for problems.   Do not smoke.   Monitor your blood pressure at home as directed by your health care provider. SEEK MEDICAL CARE IF:   You think you are having a reaction to medicines taken.  You have recurrent headaches or feel dizzy.  You have swelling in your ankles.  You have trouble with your vision. SEEK IMMEDIATE MEDICAL CARE IF:  You develop a severe headache or confusion.  You have unusual weakness, numbness, or feel faint.  You have severe chest or abdominal pain.  You vomit repeatedly.  You have trouble breathing. MAKE SURE YOU:   Understand these instructions.  Will watch your condition.  Will get help right away if you are not doing well or get worse. Document Released: 06/13/2005 Document Revised: 10/28/2013 Document Reviewed: 04/05/2013 [facility] Patient Information 2015 [place], [place]. This information is not intended to replace advice given to you by your health care provider. Make sure you discuss any questions you have with your health care provider.

## 2014-02-22 LAB — TSH: TSH: 0.71 u[IU]/mL (ref 0.350–4.500)

## 2014-02-22 LAB — LIPID PANEL
Cholesterol: 162 mg/dL (ref 0–200)
HDL: 35 mg/dL — ABNORMAL LOW (ref >39)
LDL Cholesterol: 111 mg/dL — ABNORMAL HIGH (ref 0–99)
Total CHOL/HDL Ratio: 4.6 Ratio
Triglycerides: 82 mg/dL (ref <150)
VLDL: 16 mg/dL (ref 0–40)

## 2014-02-22 LAB — BASIC METABOLIC PANEL
BUN: 16 mg/dL (ref 6–23)
CO2: 28 mEq/L (ref 19–32)
Calcium: 9.4 mg/dL (ref 8.4–10.5)
Chloride: 104 mEq/L (ref 96–112)
Creat: 1.04 mg/dL (ref 0.50–1.35)
Glucose, Bld: 85 mg/dL (ref 70–99)
Potassium: 4.8 mEq/L (ref 3.5–5.3)
Sodium: 140 mEq/L (ref 135–145)

## 2014-02-22 LAB — HEMOGLOBIN A1C
Hgb A1c MFr Bld: 6.3 % — ABNORMAL HIGH (ref <5.7)
Mean Plasma Glucose: 134 mg/dL — ABNORMAL HIGH (ref <117)

## 2014-03-12 ENCOUNTER — Ambulatory Visit: Payer: BC Managed Care – PPO | Admitting: Medical

## 2014-03-31 ENCOUNTER — Encounter: Payer: Self-pay | Admitting: Medical

## 2014-04-21 ENCOUNTER — Ambulatory Visit (INDEPENDENT_AMBULATORY_CARE_PROVIDER_SITE_OTHER): Payer: BC Managed Care – PPO | Admitting: Family Medicine

## 2014-04-21 ENCOUNTER — Encounter: Payer: Self-pay | Admitting: Family Medicine

## 2014-04-21 VITALS — BP 120/88 | HR 60 | Temp 97.6°F | Ht 71.0 in | Wt 264.0 lb

## 2014-04-21 DIAGNOSIS — K219 Gastro-esophageal reflux disease without esophagitis: Secondary | ICD-10-CM

## 2014-04-21 DIAGNOSIS — K648 Other hemorrhoids: Secondary | ICD-10-CM

## 2014-04-21 DIAGNOSIS — I10 Essential (primary) hypertension: Secondary | ICD-10-CM

## 2014-04-21 DIAGNOSIS — K644 Residual hemorrhoidal skin tags: Secondary | ICD-10-CM

## 2014-04-21 DIAGNOSIS — K5900 Constipation, unspecified: Secondary | ICD-10-CM

## 2014-04-21 MED ORDER — HYDROCORTISONE 2.5 % RE CREA: 1.0000 "application " | TOPICAL_CREAM | Freq: Two times a day (BID) | RECTAL | Status: DC

## 2014-04-21 MED ORDER — DEXLANSOPRAZOLE 60 MG PO CPDR: 60.0000 mg | DELAYED_RELEASE_CAPSULE | Freq: Every day | ORAL | Status: DC

## 2014-04-21 NOTE — Patient Instructions (Addendum)
Cut back on sodium in diet (see below). Monitor blood pressure once weekly.  If consistently >140/90 (either top or bottom number being high), then you will likely need to restart BP medication.  Chest pain is most likely related to reflux.  See information below. Cut back on acidic foods/drinks.  Elevated head of bed. Take medication for 2 weeks (Dexilant, once daily).  After 2 weeks, if you have recurrent symptoms, you can change to taking the Prevacid that you have once daily (and if symptoms still recur, you can double up on the dose).  Return for recheck in 1-2 weeks if you aren't improving.  You likely have external hemorrhoid that is flaring related to constipation.  You may also have internal hemorrhoid.   Start using stool softeners once daily (Colace). Use the hemorhhoid cream 2-3 times daily only when inflamed.  If your bleeding or itching persists, then you might also have internal hemorrhoid, in which case you need to try a suppository.   Low-Sodium Eating Plan Sodium raises blood pressure and causes water to be held in the body. Getting less sodium from food will help lower your blood pressure, reduce any swelling, and protect your heart, liver, and kidneys. We get sodium by adding salt (sodium chloride) to food. Most of our sodium comes from canned, boxed, and frozen foods. Restaurant foods, fast foods, and pizza are also very high in sodium. Even if you take medicine to lower your blood pressure or to reduce fluid in your body, getting less sodium from your food is important. WHAT IS MY PLAN? Most people should limit their sodium intake to 2,300 mg a day. Your health care provider recommends that you limit your sodium intake to __________ a day.  WHAT DO I NEED TO KNOW ABOUT THIS EATING PLAN? For the low-sodium eating plan, you will follow these general guidelines:  Choose foods with a % Daily Value for sodium of less than 5% (as listed on the food label).   Use salt-free  seasonings or herbs instead of table salt or sea salt.   Check with your health care provider or pharmacist before using salt substitutes.   Eat fresh foods.  Eat more vegetables and fruits.  Limit canned vegetables. If you do use them, rinse them well to decrease the sodium.   Limit cheese to 1 oz (28 g) per day.   Eat lower-sodium products, often labeled as "lower sodium" or "no salt added."  Avoid foods that contain monosodium glutamate (MSG). MSG is sometimes added to [place] food and some canned foods.  Check food labels (Nutrition Facts labels) on foods to learn how much sodium is in one serving.  Eat more home-cooked food and less restaurant, buffet, and fast food.  When eating at a restaurant, ask that your food be prepared with less salt or none, if possible.  HOW DO I READ FOOD LABELS FOR SODIUM INFORMATION? The Nutrition Facts label lists the amount of sodium in one serving of the food. If you eat more than one serving, you must multiply the listed amount of sodium by the number of servings. Food labels may also identify foods as:  Sodium free--Less than 5 mg in a serving.  Very low sodium--35 mg or less in a serving.  Low sodium--140 mg or less in a serving.  Light in sodium--50% less sodium in a serving. For example, if a food that usually has 300 mg of sodium is changed to become light in sodium, it will have 150  mg of sodium.  Reduced sodium--25% less sodium in a serving. For example, if a food that usually has 400 mg of sodium is changed to reduced sodium, it will have 300 mg of sodium. WHAT FOODS CAN I EAT? Grains Low-sodium cereals, including oats, puffed wheat and rice, and shredded wheat cereals. Low-sodium crackers. Unsalted rice and pasta. Lower-sodium bread.  Vegetables Frozen or fresh vegetables. Low-sodium or reduced-sodium canned vegetables. Low-sodium or reduced-sodium tomato sauce and paste. Low-sodium or reduced-sodium tomato and vegetable  juices.  Fruits Fresh, frozen, and canned fruit. Fruit juice.  Meat and Other Protein Products Low-sodium canned tuna and salmon. Fresh or frozen meat, poultry, seafood, and fish. Lamb. Unsalted nuts. Dried beans, peas, and lentils without added salt. Unsalted canned beans. Homemade soups without salt. Eggs.  Dairy Milk. Soy milk. Ricotta cheese. Low-sodium or reduced-sodium cheeses. Yogurt.  Condiments Fresh and dried herbs and spices. Salt-free seasonings. Onion and garlic powders. Low-sodium varieties of mustard and ketchup. Lemon juice.  Fats and Oils Reduced-sodium salad dressings. Unsalted butter.  Other Unsalted popcorn and pretzels.  The items listed above may not be a complete list of recommended foods or beverages. Contact your dietitian for more options. WHAT FOODS ARE NOT RECOMMENDED? Grains Instant hot cereals. Bread stuffing, pancake, and biscuit mixes. Croutons. Seasoned rice or pasta mixes. Noodle soup cups. Boxed or frozen macaroni and cheese. Self-rising flour. Regular salted crackers. Vegetables Regular canned vegetables. Regular canned tomato sauce and paste. Regular tomato and vegetable juices. Frozen vegetables in sauces. Salted french fries. Olives. Rosita Fire. Relishes. Sauerkraut. Salsa. Meat and Other Protein Products Salted, canned, smoked, spiced, or pickled meats, seafood, or fish. Bacon, ham, sausage, hot dogs, corned beef, chipped beef, and packaged luncheon meats. Salt pork. Jerky. Pickled herring. Anchovies, regular canned tuna, and sardines. Salted nuts. Dairy Processed cheese and cheese spreads. Cheese curds. Blue cheese and cottage cheese. Buttermilk.  Condiments Onion and garlic salt, seasoned salt, table salt, and sea salt. Canned and packaged gravies. Worcestershire sauce. Tartar sauce. Barbecue sauce. Teriyaki sauce. Soy sauce, including reduced sodium. Steak sauce. Fish sauce. Oyster sauce. Cocktail sauce. Horseradish. Regular ketchup and  mustard. Meat flavorings and tenderizers. Bouillon cubes. Hot sauce. Tabasco sauce. Marinades. Taco seasonings. Relishes. Fats and Oils Regular salad dressings. Salted butter. Margarine. Ghee. Bacon fat.  Other Potato and tortilla chips. Corn chips and puffs. Salted popcorn and pretzels. Canned or dried soups. Pizza. Frozen entrees and pot pies.  The items listed above may not be a complete list of foods and beverages to avoid. Contact your dietitian for more information. Document Released: 12/03/2001 Document Revised: 06/18/2013 Document Reviewed: 04/17/2013 [facility] Patient Information 2015 [place], [place]. This information is not intended to replace advice given to you by your health care provider. Make sure you discuss any questions you have with your health care provider.  Food Choices for Gastroesophageal Reflux Disease When you have gastroesophageal reflux disease (GERD), the foods you eat and your eating habits are very important. Choosing the right foods can help ease the discomfort of GERD. WHAT GENERAL GUIDELINES DO I NEED TO FOLLOW?  Choose fruits, vegetables, whole grains, low-fat dairy products, and low-fat meat, fish, and poultry.  Limit fats such as oils, salad dressings, butter, nuts, and avocado.  Keep a food diary to identify foods that cause symptoms.  Avoid foods that cause reflux. These may be different for different people.  Eat frequent small meals instead of three large meals each day.  Eat your meals slowly, in a relaxed setting.  Limit fried foods.  Cook foods using methods other than frying.  Avoid drinking alcohol.  Avoid drinking large amounts of liquids with your meals.  Avoid bending over or lying down until 2-3 hours after eating. WHAT FOODS ARE NOT RECOMMENDED? The following are some foods and drinks that may worsen your symptoms: Vegetables Tomatoes. Tomato juice. Tomato and spaghetti sauce. Chili peppers. Onion and garlic.  Horseradish. Fruits Oranges, grapefruit, and lemon (fruit and juice). Meats High-fat meats, fish, and poultry. This includes hot dogs, ribs, ham, sausage, salami, and bacon. Dairy Whole milk and chocolate milk. Sour cream. Cream. Butter. Ice cream. Cream cheese.  Beverages Coffee and tea, with or without caffeine. Carbonated beverages or energy drinks. Condiments Hot sauce. Barbecue sauce.  Sweets/Desserts Chocolate and cocoa. Donuts. Peppermint and spearmint. Fats and Oils High-fat foods, including [place] fries and potato chips. Other Vinegar. Strong spices, such as black pepper, white pepper, red pepper, cayenne, curry powder, cloves, ginger, and chili powder. The items listed above may not be a complete list of foods and beverages to avoid. Contact your dietitian for more information. Document Released: 06/13/2005 Document Revised: 06/18/2013 Document Reviewed: 04/17/2013 [facility] Patient Information 2015 [place], [place]. This information is not intended to replace advice given to you by your health care provider. Make sure you discuss any questions you have with your health care provider.  Constipation Constipation is when a person has fewer than three bowel movements a week, has difficulty having a bowel movement, or has stools that are dry, hard, or larger than normal. As people grow older, constipation is more common. If you try to fix constipation with medicines that make you have a bowel movement (laxatives), the problem may get worse. Long-term laxative use may cause the muscles of the colon to become weak. A low-fiber diet, not taking in enough fluids, and taking certain medicines may make constipation worse.  CAUSES   Certain medicines, such as antidepressants, pain medicine, iron supplements, antacids, and water pills.   Certain diseases, such as diabetes, irritable bowel syndrome (IBS), thyroid disease, or depression.   Not drinking enough water.   Not eating enough  fiber-rich foods.   Stress or travel.   Lack of physical activity or exercise.   Ignoring the urge to have a bowel movement.   Using laxatives too much.  SIGNS AND SYMPTOMS   Having fewer than three bowel movements a week.   Straining to have a bowel movement.   Having stools that are hard, dry, or larger than normal.   Feeling full or bloated.   Pain in the lower abdomen.   Not feeling relief after having a bowel movement.  DIAGNOSIS  Your health care provider will take a medical history and perform a physical exam. Further testing may be done for severe constipation. Some tests may include:  A barium enema X-ray to examine your rectum, colon, and, sometimes, your small intestine.   A sigmoidoscopy to examine your lower colon.   A colonoscopy to examine your entire colon. TREATMENT  Treatment will depend on the severity of your constipation and what is causing it. Some dietary treatments include drinking more fluids and eating more fiber-rich foods. Lifestyle treatments may include regular exercise. If these diet and lifestyle recommendations do not help, your health care provider may recommend taking over-the-counter laxative medicines to help you have bowel movements. Prescription medicines may be prescribed if over-the-counter medicines do not work.  HOME CARE INSTRUCTIONS   Eat foods that have a lot of fiber,  such as fruits, vegetables, whole grains, and beans.  Limit foods high in fat and processed sugars, such as french fries, hamburgers, cookies, candies, and soda.   A fiber supplement may be added to your diet if you cannot get enough fiber from foods.   Drink enough fluids to keep your urine clear or pale yellow.   Exercise regularly or as directed by your health care provider.   Go to the restroom when you have the urge to go. Do not hold it.   Only take over-the-counter or prescription medicines as directed by your health care provider. Do  not take other medicines for constipation without talking to your health care provider first.  SEEK IMMEDIATE MEDICAL CARE IF:   You have bright red blood in your stool.   Your constipation lasts for more than 4 days or gets worse.   You have abdominal or rectal pain.   You have thin, pencil-like stools.   You have unexplained weight loss. MAKE SURE YOU:   Understand these instructions.  Will watch your condition.  Will get help right away if you are not doing well or get worse. Document Released: 03/11/2004 Document Revised: 06/18/2013 Document Reviewed: 03/25/2013 [facility] Patient Information 2015 [place], [place]. This information is not intended to replace advice given to you by your health care provider. Make sure you discuss any questions you have with your health care provider.  Hemorrhoids Hemorrhoids are swollen veins around the rectum or anus. There are two types of hemorrhoids:   Internal hemorrhoids. These occur in the veins just inside the rectum. They may poke through to the outside and become irritated and painful.  External hemorrhoids. These occur in the veins outside the anus and can be felt as a painful swelling or hard lump near the anus. CAUSES  Pregnancy.   Obesity.   Constipation or diarrhea.   Straining to have a bowel movement.   Sitting for long periods on the toilet.  Heavy lifting or other activity that caused you to strain.  Anal intercourse. SYMPTOMS   Pain.   Anal itching or irritation.   Rectal bleeding.   Fecal leakage.   Anal swelling.   One or more lumps around the anus.  DIAGNOSIS  Your caregiver may be able to diagnose hemorrhoids by visual examination. Other examinations or tests that may be performed include:   Examination of the rectal area with a gloved hand (digital rectal exam).   Examination of anal canal using a small tube (scope).   A blood test if you have lost a significant amount of  blood.  A test to look inside the colon (sigmoidoscopy or colonoscopy). TREATMENT Most hemorrhoids can be treated at home. However, if symptoms do not seem to be getting better or if you have a lot of rectal bleeding, your caregiver may perform a procedure to help make the hemorrhoids get smaller or remove them completely. Possible treatments include:   Placing a rubber band at the base of the hemorrhoid to cut off the circulation (rubber band ligation).   Injecting a chemical to shrink the hemorrhoid (sclerotherapy).   Using a tool to burn the hemorrhoid (infrared light therapy).   Surgically removing the hemorrhoid (hemorrhoidectomy).   Stapling the hemorrhoid to block blood flow to the tissue (hemorrhoid stapling).  HOME CARE INSTRUCTIONS   Eat foods with fiber, such as whole grains, beans, nuts, fruits, and vegetables. Ask your doctor about taking products with added fiber in them (fibersupplements).  Increase fluid intake.  Drink enough water and fluids to keep your urine clear or pale yellow.   Exercise regularly.   Go to the bathroom when you have the urge to have a bowel movement. Do not wait.   Avoid straining to have bowel movements.   Keep the anal area dry and clean. Use wet toilet paper or moist towelettes after a bowel movement.   Medicated creams and suppositories may be used or applied as directed.   Only take over-the-counter or prescription medicines as directed by your caregiver.   Take warm sitz baths for 15-20 minutes, 3-4 times a day to ease pain and discomfort.   Place ice packs on the hemorrhoids if they are tender and swollen. Using ice packs between sitz baths may be helpful.   Put ice in a plastic bag.   Place a towel between your skin and the bag.   Leave the ice on for 15-20 minutes, 3-4 times a day.   Do not use a donut-shaped pillow or sit on the toilet for long periods. This increases blood pooling and pain.  SEEK MEDICAL  CARE IF:  You have increasing pain and swelling that is not controlled by treatment or medicine.  You have uncontrolled bleeding.  You have difficulty or you are unable to have a bowel movement.  You have pain or inflammation outside the area of the hemorrhoids. MAKE SURE YOU:  Understand these instructions.  Will watch your condition.  Will get help right away if you are not doing well or get worse. Document Released: 06/10/2000 Document Revised: 05/30/2012 Document Reviewed: 04/17/2012 [facility] Patient Information 2015 [place], [place]. This information is not intended to replace advice given to you by your health care provider. Make sure you discuss any questions you have with your health care provider.

## 2014-04-21 NOTE — Progress Notes (Signed)
Chief Complaint  Patient presents with  . Chest Pain    having chest pain x 4 days, blood in stoll x 4 days-but has had hemmorhoids in the past.    Pain starts in the middle of his chest, and radiates around to his lower left chest, below his left ribs.  It feels like a "knot" in his chest, like something is blocking there--blocking air passing, not with food/liquids.  It is off and on, most noticeable when he is trying to go to sleep.  Doesn't hurt to take a deep breath.  He has been burping some, helps relieve the pain sometimes. He hasn't been sleeping well, partly related to pain.  He describes the pain as "bloated" in epigastrium, and extending to left side.  Feels like there is a gas bubble under the ribs on the left. He sometimes has nausea.  Since last visit, he started eating better (more salads).  Lately, he is getting out of football practice late--still not eating fast foods, mostly cooking, but eating later at night. Occasional soda, no other caffeine. No spicy foods. +lemonade and cranberry juices Not a lot of pizza or tomato based foods.  He is having some constipation and straining.  Having stools daily. He is having rectal itching, similar to with prior hemorrhoids.  Denies pain with bowel movements.  He started noted blood in the stool about 2 weeks ago.  He used preparation H cream, which helped, but bleeding recurred after he stopped using it.   He feels weak when he gets up in the morning--legs feel weak, chest still hurts a little. Gets better as the day goes on.  Legs might have some discomfort later in the day, if he sits for a while.  Hypertension:  He has been off meds x 4 months.  [facility] [address], and recalls that it was high (140's?).   He has been having headaches at night, sometimes during the day.  Pain is at the back of the head +canned vegetables  Past Medical History  Diagnosis Date  . Hypertension   . Seasonal allergies    . GERD (gastroesophageal reflux disease)    Past Surgical History  Procedure Laterality Date  . Fracture surgery Right     wrist   History   Social History  . Marital Status: Single    Spouse Name: N/A    Number of Children: N/A  . Years of Education: N/A   Occupational History  . Not on file.   Social History Main Topics  . Smoking status: Never Smoker   . Smokeless tobacco: Never Used  . Alcohol Use: No  . Drug Use: No  . Sexual Activity: Yes   Other Topics Concern  . 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    Outpatient Encounter Prescriptions as of 04/21/2014  Medication Sig Note  . lansoprazole (PREVACID) 15 MG capsule Take 15 mg by mouth daily at 12 noon. 04/21/2014: Was using it prn, not taken in 2 days, very sporadic  . lisinopril (PRINIVIL,ZESTRIL) 10 MG tablet Take 10 mg by mouth daily. 04/21/2014: Hasn't taken in about 4 months (trial off meds per [place])   He has also been taking some liquid antacid (Maalox? Mylanta?) which sometimes helps.  No Known Allergies  ROS:  Denies fevers.  Occasional chills. No weight changes.  Denies URI or allergy symptoms, no cough, shortness of breath,  wheezing.  +nausea at night.  Abdominal pain and bowel changes per HPI.  Denies anxiety/depression.  +insomnia--related to chest/stomach pain. See HPI   PHYSICAL EXAM:  BP 120/88  Pulse 60  Temp(Src) 97.6 F (36.4 C) (Tympanic)  Ht 5\' 11"  (1.803 m)  Wt 264 lb (119.75 kg)  BMI 36.84 kg/m2 130/92 on repeat by MD Well developed, anxious-appearing male, in no acute distress HEENT: PERRL, EOMI, conjunctiva and sclera are clear Neck: no lymphadenopathy, thyromegaly or mass Heart: regular rate and rhythm Lungs: clear bilaterally Abdomen: Tender in epigastrium and LUQ.  No rebound tenderness or guarding. No organomegaly or mass Rectal: Very small external hemorrhoid posteriorly.  Small smear of light brown stool.   Digital rectal exam not performed. Small area of pink tissue (?healing fissure).  ASSESSMENT/PLAN:   Gastroesophageal reflux disease, esophagitis presence not specified - reviewed behavioral and dietary modification.  Dexilant daily, then cut back to OTC PPI, then just to prn - Plan: dexlansoprazole (DEXILANT) 60 MG capsule  External hemorrhoid - improved, but still slightly inflamed. Anusol prn. Need to treat constipation.  f/u if persistent bleeding - Plan: hydrocortisone (ANUSOL-HC) 2.5 % rectal cream  Essential hypertension, benign - mildly elevated today (but anxious, especially to have male evaluating rectal complaint). Low sodium diet; periodically monitor BP and f/u if remains high  Essential hypertension  Constipation, unspecified constipation type - discussed fluid intake, high fiber diet, stool softeners   Cut back on sodium in diet (see below). Monitor blood pressure once weekly.  If consistently >140/90 (either top or bottom number being high), then you will likely need to restart BP medication.  Chest pain is most likely related to reflux.  See information below. Cut back on acidic foods/drinks.  Elevated head of bed. Take medication for 2 weeks  You likely have external hemorrhoid that is flaring related to constipation.  You may also have internal hemorrhoid.   Start using stool softeners once daily (Colace). Use the hemorhhoid cream 2-3 times daily only when inflamed.  If your bleeding or itching persists, then you might also have internal hemorrhoid, in which case you need to try a suppository.

## 2014-06-05 ENCOUNTER — Encounter (HOSPITAL_COMMUNITY): Payer: Self-pay | Admitting: Emergency Medicine

## 2014-06-05 ENCOUNTER — Emergency Department (HOSPITAL_COMMUNITY)
Admission: EM | Admit: 2014-06-05 | Discharge: 2014-06-05 | Disposition: A | Discharge status: Home / Self Care | Payer: BC Managed Care – PPO | Attending: Emergency Medicine | Admitting: Emergency Medicine

## 2014-06-05 DIAGNOSIS — M545 Low back pain, unspecified: Secondary | ICD-10-CM

## 2014-06-05 DIAGNOSIS — K648 Other hemorrhoids: Secondary | ICD-10-CM | POA: Insufficient documentation

## 2014-06-05 DIAGNOSIS — Z79899 Other long term (current) drug therapy: Secondary | ICD-10-CM | POA: Diagnosis not present

## 2014-06-05 DIAGNOSIS — I10 Essential (primary) hypertension: Secondary | ICD-10-CM | POA: Diagnosis not present

## 2014-06-05 DIAGNOSIS — R109 Unspecified abdominal pain: Secondary | ICD-10-CM | POA: Insufficient documentation

## 2014-06-05 DIAGNOSIS — K219 Gastro-esophageal reflux disease without esophagitis: Secondary | ICD-10-CM | POA: Insufficient documentation

## 2014-06-05 LAB — URINALYSIS, ROUTINE W REFLEX MICROSCOPIC
Bilirubin Urine: NEGATIVE
Glucose, UA: NEGATIVE mg/dL
Hgb urine dipstick: NEGATIVE
Ketones, ur: NEGATIVE mg/dL
Leukocytes, UA: NEGATIVE
Nitrite: NEGATIVE
Protein, ur: NEGATIVE mg/dL
Specific Gravity, Urine: 1.015 (ref 1.005–1.030)
Urobilinogen, UA: 1 mg/dL (ref 0.0–1.0)
pH: 7 (ref 5.0–8.0)

## 2014-06-05 MED ORDER — DIAZEPAM 5 MG PO TABS: 5.0000 mg | ORAL_TABLET | Freq: Four times a day (QID) | ORAL | Status: DC | PRN

## 2014-06-05 MED ORDER — DIAZEPAM 5 MG PO TABS
5.0000 mg | ORAL_TABLET | Freq: Once | ORAL | Status: AC
  Administered 2014-06-05: 5 mg via ORAL
  Filled 2014-06-05: qty 1

## 2014-06-05 NOTE — ED Notes (Signed)
Pt states he is having low back pain that goes into his sides that  started 3 days ago  Pt denies injury  Pt states he has blood in his stool that has been going on "for a while"   Pt states the blood is on the tissue and in the toilet  Pt states he has been having some pain in his lower abdomen  Pt states he has also been having periods of dizziness

## 2014-06-05 NOTE — ED Notes (Signed)
Urine collected and at bedside.

## 2014-06-05 NOTE — Discharge Instructions (Signed)
Back Pain, Adult Low back pain is very common. About 1 in 5 people have back pain.The cause of low back pain is rarely dangerous. The pain often gets better over time.About half of people with a sudden onset of back pain feel better in just 2 weeks. About 8 in 10 people feel better by 6 weeks.  CAUSES Some common causes of back pain include:  Strain of the muscles or ligaments supporting the spine.  Wear and tear (degeneration) of the spinal discs.  Arthritis.  Direct injury to the back. DIAGNOSIS Most of the time, the direct cause of low back pain is not known.However, back pain can be treated effectively even when the exact cause of the pain is unknown.Answering your caregiver's questions about your overall health and symptoms is one of the most accurate ways to make sure the cause of your pain is not dangerous. If your caregiver needs more information, he or she may order lab work or imaging tests (X-rays or MRIs).However, even if imaging tests show changes in your back, this usually does not require surgery. HOME CARE INSTRUCTIONS For many people, back pain returns.Since low back pain is rarely dangerous, it is often a condition that people can learn to manageon their own.   Remain active. It is stressful on the back to sit or stand in one place. Do not sit, drive, or stand in one place for more than 30 minutes at a time. Take short walks on level surfaces as soon as pain allows.Try to increase the length of time you walk each day.  Do not stay in bed.Resting more than 1 or 2 days can delay your recovery.  Do not avoid exercise or work.Your body is made to move.It is not dangerous to be active, even though your back may hurt.Your back will likely heal faster if you return to being active before your pain is gone.  Pay attention to your body when you bend and lift. Many people have less discomfortwhen lifting if they bend their knees, keep the load close to their bodies,and  avoid twisting. Often, the most comfortable positions are those that put less stress on your recovering back.  Find a comfortable position to sleep. Use a firm mattress and lie on your side with your knees slightly bent. If you lie on your back, put a pillow under your knees.  Only take over-the-counter or prescription medicines as directed by your caregiver. Over-the-counter medicines to reduce pain and inflammation are often the most helpful.Your caregiver may prescribe muscle relaxant drugs.These medicines help dull your pain so you can more quickly return to your normal activities and healthy exercise.  Put ice on the injured area.  Put ice in a plastic bag.  Place a towel between your skin and the bag.  Leave the ice on for 15-20 minutes, 03-04 times a day for the first 2 to 3 days. After that, ice and heat may be alternated to reduce pain and spasms.  Ask your caregiver about trying back exercises and gentle massage. This may be of some benefit.  Avoid feeling anxious or stressed.Stress increases muscle tension and can worsen back pain.It is important to recognize when you are anxious or stressed and learn ways to manage it.Exercise is a great option. SEEK MEDICAL CARE IF:  You have pain that is not relieved with rest or medicine.  You have pain that does not improve in 1 week.  You have new symptoms.  You are generally not feeling well. SEEK   IMMEDIATE MEDICAL CARE IF:   You have pain that radiates from your back into your legs.  You develop new bowel or bladder control problems.  You have unusual weakness or numbness in your arms or legs.  You develop nausea or vomiting.  You develop abdominal pain.  You feel faint. Document Released: 06/13/2005 Document Revised: 12/13/2011 Document Reviewed: 10/15/2013 ExitCare Patient Information 2015 [employer]. This information is not intended to replace advice given to you by your health care provider. Make sure you  discuss any questions you have with your health care provider.  

## 2014-06-05 NOTE — ED Provider Notes (Signed)
CSN: [id number]     Arrival date & time 06/05/14  2046 History   First MD Initiated Contact with Patient 06/05/14 2134     Chief Complaint  Patient presents with  . Back Pain     (Consider location/radiation/quality/duration/timing/severity/associated sxs/prior Treatment) Patient is a 28 y.o. male presenting with back pain.  Back Pain Location:  Lumbar spine Quality:  Aching and cramping Radiates to:  Does not radiate Pain severity:  Moderate Onset quality:  Gradual Duration:  3 days Timing:  Constant Progression:  Unchanged Chronicity:  New Context: not falling and not recent injury   Relieved by:  Nothing Worsened by:  Movement and palpation Ineffective treatments:  None tried Associated symptoms: abdominal pain (intermittent)   Associated symptoms: no bladder incontinence, no bowel incontinence, no dysuria and no fever     Past Medical History  Diagnosis Date  . Hypertension   . Seasonal allergies   . GERD (gastroesophageal reflux disease)    Past Surgical History  Procedure Laterality Date  . Fracture surgery Right     wrist   Family History  Problem Relation Age of Onset  . Hypertension Other   . Cancer Other   . Diabetes Other    History  Substance Use Topics  . Smoking status: Never Smoker   . Smokeless tobacco: Never Used  . Alcohol Use: No    Review of Systems  Constitutional: Negative for fever.  Gastrointestinal: Positive for abdominal pain (intermittent). Negative for bowel incontinence.  Genitourinary: Negative for bladder incontinence and dysuria.  Musculoskeletal: Positive for back pain.  All other systems reviewed and are negative.     Allergies  Review of patient's allergies indicates no known allergies.  Home Medications   Prior to Admission medications   Medication Sig Start Date End Date Taking? Authorizing Provider  acetaminophen (TYLENOL) 500 MG tablet Take 1,000 mg by mouth every 6 (six) hours as needed for moderate pain.    Yes Historical Provider, MD  docusate sodium (COLACE) 100 MG capsule Take 100 mg by mouth daily as needed for mild constipation.   Yes Historical Provider, MD  phenylephrine-shark liver oil-mineral oil-petrolatum (PREPARATION H) 0.25-3-14-71.9 % rectal ointment Place 1 application rectally 2 (two) times daily as needed for hemorrhoids.   Yes Historical Provider, MD  diazepam (VALIUM) 5 MG tablet Take 1 tablet (5 mg total) by mouth every 6 (six) hours as needed for muscle spasms. 06/05/14   Mirian MoMatthew Stefanny Pieri, MD  lansoprazole (PREVACID) 15 MG capsule Take 15 mg by mouth daily at 12 noon.    Historical Provider, MD  lisinopril (PRINIVIL,ZESTRIL) 10 MG tablet Take 10 mg by mouth daily.    Historical Provider, MD   BP 141/80 mmHg  Pulse 62  Temp(Src) 97.8 F (36.6 C) (Oral)  Resp 16  Ht 5\' 11"  (1.803 m)  Wt 250 lb (113.399 kg)  BMI 34.88 kg/m2  SpO2 100% Physical Exam  Constitutional: He is oriented to person, place, and time. He appears well-developed and well-nourished.  HENT:  Head: Normocephalic and atraumatic.  Eyes: Conjunctivae and EOM are normal.  Neck: Normal range of motion. Neck supple.  Cardiovascular: Normal rate, regular rhythm and normal heart sounds.   Pulmonary/Chest: Effort normal and breath sounds normal. No respiratory distress.  Abdominal: He exhibits no distension. There is no tenderness. There is no rebound and no guarding.  Genitourinary: Rectal exam shows external hemorrhoid (nonthrombosed).  Musculoskeletal: Normal range of motion.       Lumbar back: He exhibits tenderness.  He exhibits no bony tenderness.  Neurological: He is alert and oriented to person, place, and time.  Skin: Skin is warm and dry.  Vitals reviewed.   ED Course  Procedures (including critical care time) Labs Review Labs Reviewed  URINALYSIS, ROUTINE W REFLEX MICROSCOPIC    Imaging Review No results found.   EKG Interpretation None      MDM   Final diagnoses:  Bilateral low back  pain without sciatica   28 y.o. male without pertinent PMH presents with back pain as described above.  No recent trauma, infectious symptoms. The patient has a history of hemorrhoids and has had chronic rectal bleeding for some time.  No historical symptoms of anemia.  His exam today is consistent with musculoskeletal strain. Urine without blood or signs of infection. Symptoms relieved with Valium. Given strict return precautions for back pain, voiced understanding and agreed to follow-up.    1. Bilateral low back pain without sciatica        Mirian MoMatthew Robyn Nohr, MD 06/05/14 2316

## 2014-06-25 ENCOUNTER — Encounter: Payer: Self-pay | Admitting: Medical

## 2014-06-25 ENCOUNTER — Ambulatory Visit (INDEPENDENT_AMBULATORY_CARE_PROVIDER_SITE_OTHER): Payer: BC Managed Care – PPO | Admitting: Medical

## 2014-06-25 VITALS — BP 138/88 | HR 80 | Temp 98.0°F | Resp 16 | Wt 265.0 lb

## 2014-06-25 DIAGNOSIS — R7301 Impaired fasting glucose: Secondary | ICD-10-CM

## 2014-06-25 DIAGNOSIS — N489 Disorder of penis, unspecified: Secondary | ICD-10-CM

## 2014-06-25 DIAGNOSIS — R030 Elevated blood-pressure reading, without diagnosis of hypertension: Secondary | ICD-10-CM

## 2014-06-25 DIAGNOSIS — R21 Rash and other nonspecific skin eruption: Secondary | ICD-10-CM

## 2014-06-25 MED ORDER — IMIQUIMOD 5 % EX CREA: TOPICAL_CREAM | CUTANEOUS | Status: DC

## 2014-06-25 NOTE — Progress Notes (Signed)
Subjective: Here for 2 issues  Worried about BP, elevated few weeks ago.   Checks sometimes.  Last few times he checked it was slightly high.  Has been on BP medication in the past.   Gets occasional headache, but no CP, edema, SOB, palpitations.  Drinks soda, doesn't add salt to food.  Exercises some.  Diet - some discretion.     Has concern for STD.  Feels like the skin looks irritated, has a fleshy bump he just noticed.  3 small areas of concern.   Been this way the last few weeks.  Has one sexual partner for long time.  Uses condoms most of the time.   Last STD testing last year.  No dysuria, no pain, no swelling, no penile discharge.   No itching.   Using lotion on the area.  ROS as in subjective  Objective BP 138/88 mmHg  Pulse 80  Temp(Src) 98 F (36.7 C) (Oral)  Resp 16  Wt 265 lb (120.203 kg)  BP Readings from Last 3 Encounters:  06/25/14 138/88  06/05/14 141/80  04/21/14 120/88   Wt Readings from Last 3 Encounters:  06/25/14 265 lb (120.203 kg)  06/05/14 250 lb (113.399 kg)  04/21/14 264 lb (119.75 kg)    General appearance: alert, no distress, WD/WN Neck: supple, no lymphadenopathy, no thyromegaly, no masses Heart: RRR, normal S1, S2, no murmurs Lungs: CTA bilaterally, no wheezes, rhonchi, or rales Pulses: 2+ symmetric, upper and lower extremities, normal cap refill Ext: no edema GU: volar distal shaft of penis with 3 small slightly raised whitish lesions, nonspecific, not particualr warty, but certainly no vesicles, erythema, molluscum or chancer.   Circumcised, nontender, no lumps, otherwise normal genitalia     Assessment: Encounter Diagnoses  Name Primary?  . Elevated blood pressure reading Yes  . Penile rash   . Impaired fasting blood sugar     Plan Elevated BP - keep BP diary as discussed, return in a few weeks for physical, STD labs, routine labs, recheck on impaired fasting glucose   Penile rash - nonspecific rash, possible warts though.  Begin  Aldara, recheck 3-4 wk.   Impaired fasting glucose - declines labs today.   F/u soon for CPX.

## 2014-07-10 ENCOUNTER — Encounter: Payer: Self-pay | Admitting: Medical

## 2014-07-10 ENCOUNTER — Ambulatory Visit (INDEPENDENT_AMBULATORY_CARE_PROVIDER_SITE_OTHER): Payer: BLUE CROSS/BLUE SHIELD | Admitting: Medical

## 2014-07-10 VITALS — BP 130/88 | HR 60 | Temp 98.2°F | Resp 16 | Ht 70.0 in | Wt 260.0 lb

## 2014-07-10 DIAGNOSIS — Z113 Encounter for screening for infections with a predominantly sexual mode of transmission: Secondary | ICD-10-CM

## 2014-07-10 DIAGNOSIS — L700 Acne vulgaris: Secondary | ICD-10-CM

## 2014-07-10 DIAGNOSIS — Z0000 Encounter for general adult medical examination without abnormal findings: Secondary | ICD-10-CM

## 2014-07-10 DIAGNOSIS — I10 Essential (primary) hypertension: Secondary | ICD-10-CM

## 2014-07-10 DIAGNOSIS — L989 Disorder of the skin and subcutaneous tissue, unspecified: Secondary | ICD-10-CM

## 2014-07-10 DIAGNOSIS — K219 Gastro-esophageal reflux disease without esophagitis: Secondary | ICD-10-CM

## 2014-07-10 DIAGNOSIS — R7301 Impaired fasting glucose: Secondary | ICD-10-CM

## 2014-07-10 LAB — POCT URINALYSIS DIPSTICK
Bilirubin, UA: NEGATIVE
Blood, UA: NEGATIVE
Glucose, UA: NEGATIVE
Ketones, UA: NEGATIVE
Leukocytes, UA: NEGATIVE
Nitrite, UA: NEGATIVE
Protein, UA: NEGATIVE
Spec Grav, UA: >=1.03
Urobilinogen, UA: NEGATIVE
pH, UA: 6

## 2014-07-10 LAB — COMPREHENSIVE METABOLIC PANEL
ALT: 28 U/L (ref 0–53)
AST: 25 U/L (ref 0–37)
Albumin: 4.4 g/dL (ref 3.5–5.2)
Alkaline Phosphatase: 48 U/L (ref 39–117)
BUN: 16 mg/dL (ref 6–23)
CO2: 26 mEq/L (ref 19–32)
Calcium: 9.7 mg/dL (ref 8.4–10.5)
Chloride: 98 mEq/L (ref 96–112)
Creat: 1.15 mg/dL (ref 0.50–1.35)
Glucose, Bld: 93 mg/dL (ref 70–99)
Potassium: 4.2 mEq/L (ref 3.5–5.3)
Sodium: 132 mEq/L — ABNORMAL LOW (ref 135–145)
Total Bilirubin: 0.4 mg/dL (ref 0.2–1.2)
Total Protein: 7.6 g/dL (ref 6.0–8.3)

## 2014-07-10 LAB — LIPID PANEL
Cholesterol: 173 mg/dL (ref 0–200)
HDL: 37 mg/dL — ABNORMAL LOW (ref >39)
LDL Cholesterol: 115 mg/dL — ABNORMAL HIGH (ref 0–99)
Total CHOL/HDL Ratio: 4.7 Ratio
Triglycerides: 105 mg/dL (ref <150)
VLDL: 21 mg/dL (ref 0–40)

## 2014-07-10 LAB — TSH: TSH: 0.868 u[IU]/mL (ref 0.350–4.500)

## 2014-07-10 MED ORDER — MINOCYCLINE HCL 100 MG PO CAPS: 100.0000 mg | ORAL_CAPSULE | Freq: Every day | ORAL | Status: DC

## 2014-07-10 NOTE — Progress Notes (Signed)
Subjective:   HPI  Ryan Rowe is a 29 y.o. male who presents for a complete physical.   Preventative care: Last ophthalmology visit:years ago Last dental visit:years ago Last colonoscopy:no Last prostate exam:no  Last WUJ:WJXBJEKG:years ago Last labs:2014  Prior vaccinations: TD or Tdap:current Influenza: done here this year  Concerns: Acne all over, nuisance, wants to try medication for this.   ongoing problem.   GERD - intermittent flare ups.  Reviewed their medical, surgical, family, social, medication, and allergy history and updated chart as appropriate.  Past Medical History  Diagnosis Date  . Hypertension   . Seasonal allergies   . GERD (gastroesophageal reflux disease)   . Influenza 2015    hospitalization, syncope due to illness    Past Surgical History  Procedure Laterality Date  . Fracture surgery Right     wrist    History   Social History  . Marital Status: Single    Spouse Name: N/A    Number of Children: N/A  . Years of Education: N/A   Occupational History  . Not on file.   Social History Main Topics  . Smoking status: Never Smoker   . Smokeless tobacco: Never Used  . Alcohol Use: 0.6 oz/week    1 Glasses of wine per week  . Drug Use: No  . Sexual Activity: Yes   Other Topics Concern  . 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  Single, no children.  Exercise - regularly. Diet - healthy.       Family History  Problem Relation Age of Onset  . Diabetes Other   . Depression Mother   . Hypertension Father   . Cancer Maternal Aunt     breast  . ALS Maternal Grandfather   . Stroke Neg Hx   . Cancer Maternal Aunt     breast  . Heart disease Cousin 50     Current outpatient prescriptions:  .  docusate sodium (COLACE) 100 MG capsule, Take 100 mg by mouth daily as needed for mild constipation., Disp: , Rfl:  .  imiquimod (ALDARA) 5 % cream, Apply topically 3 (three) times a week.,  Disp: 12 each, Rfl: 0 .  phenylephrine-shark liver oil-mineral oil-petrolatum (PREPARATION H) 0.25-3-14-71.9 % rectal ointment, Place 1 application rectally 2 (two) times daily as needed for hemorrhoids., Disp: , Rfl:  .  lansoprazole (PREVACID) 15 MG capsule, Take 15 mg by mouth daily at 12 noon., Disp: , Rfl:  .  minocycline (MINOCIN) 100 MG capsule, Take 1 capsule (100 mg total) by mouth daily., Disp: 30 capsule, Rfl: 1  No Known Allergies   Review of Systems Constitutional: -fever, -chills, -sweats, -unexpected weight change, -decreased appetite, -fatigue Allergy: -sneezing, -itching, -congestion Dermatology: -changing moles, --rash, -lumps ENT: -runny nose, -ear pain, -sore throat, -hoarseness, -sinus pain, -teeth pain, - ringing in ears, -hearing loss, -nosebleeds Cardiology: -chest pain, -palpitations, -swelling, -difficulty breathing when lying flat, -waking up short of breath Respiratory: -cough, -shortness of breath, -difficulty breathing with exercise or exertion, -wheezing, -coughing up blood Gastroenterology: -abdominal pain, -nausea, -vomiting, -diarrhea, -constipation, -blood in stool, -changes in bowel movement, -difficulty swallowing or eating Hematology: -bleeding, -bruising  Musculoskeletal: -joint aches, -muscle aches, -joint swelling, -back pain, -neck pain, -cramping, -changes in gait Ophthalmology: denies vision changes, eye redness, itching, discharge Urology: -burning with urination, -difficulty urinating, -blood in urine, -urinary frequency, -urgency, -incontinence Neurology: -headache, -weakness, -tingling, -numbness, -memory loss, -falls, -+dizziness Psychology: -  depressed mood, -agitation, -sleep problems     Objective:   Physical Exam  BP 130/88 mmHg  Pulse 60  Temp(Src) 98.2 F (36.8 C) (Oral)  Resp 16  Ht  (1.778 m)  Wt 260 lb (117.935 kg)  BMI 37.31 kg/m2  General appearance: alert, no distress, WD/WN, muscular AA male Skin:moderate acne  throughout face, torso, arms, somewhat on legs HEENT: normocephalic, conjunctiva/corneas normal, sclerae anicteric, PERRLA, EOMi, nares patent, no discharge or erythema, pharynx normal Oral cavity: MMM, tongue normal, teeth with moderate plaque Neck: supple, no lymphadenopathy, no thyromegaly, no masses, normal ROM, no bruits Chest: non tender, normal shape and expansion Heart: RRR, normal S1, S2, no murmurs Lungs: CTA bilaterally, no wheezes, rhonchi, or rales Abdomen: +bs, soft, non tender, non distended, no masses, no hepatomegaly, no splenomegaly, no bruits Back: non tender, normal ROM, no scoliosis Musculoskeletal: upper extremities non tender, no obvious deformity, normal ROM throughout, lower extremities non tender, no obvious deformity, normal ROM throughout Extremities: no edema, no cyanosis, no clubbing Pulses: 2+ symmetric, upper and lower extremities, normal cap refill Neurological: alert, oriented x 3, CN2-12 intact, strength normal upper extremities and lower extremities, sensation normal throughout, DTRs 2+ throughout, no cerebellar signs, gait normal Psychiatric: normal affect, behavior normal, pleasant  GU: normal male external genitalia, circumcised, small slightly raised light brown 3mm lesion of volar side of distal penis, otherwise nontender, no masses, no hernia, no lymphadenopathy Rectal: deferred   Assessment and Plan :    Encounter Diagnoses  Name Primary?  . Encounter for health maintenance examination in adult Yes  . Essential hypertension   . Acne vulgaris   . Skin lesion   . Gastroesophageal reflux disease without esophagitis   . Screen for STD (sexually transmitted disease)   . Impaired fasting blood sugar      Physical exam - discussed healthy lifestyle, diet, exercise, preventative care, vaccinations, and addressed their concerns.   HTN - controlled off medication for now.  C/t health lifestyle which he has been working on Acne - discussed findings,  treatment options, begin 30 day trial of Minocycline, daily washes, OTC Benzoyl peroxide Skin lesion of penis - c/t aldara, f/u in 3-4 wk GERD - diet controlled, uses medication periodically STD screening today, discussed safe sex impaired fasting glucose - labs today See your dentist yearly for routine dental care including hygiene visits twice yearly. Follow-up pending labs

## 2014-07-11 LAB — CBC
HCT: 45.5 % (ref 39.0–52.0)
Hemoglobin: 14.2 g/dL (ref 13.0–17.0)
MCH: 22 pg — ABNORMAL LOW (ref 26.0–34.0)
MCHC: 31.2 g/dL (ref 30.0–36.0)
MCV: 70.7 fL — ABNORMAL LOW (ref 78.0–100.0)
MPV: 9.3 fL (ref 8.6–12.4)
Platelets: 276 10*3/uL (ref 150–400)
RBC: 6.44 MIL/uL — ABNORMAL HIGH (ref 4.22–5.81)
RDW: 15.3 % (ref 11.5–15.5)
WBC: 5.5 10*3/uL (ref 4.0–10.5)

## 2014-07-11 LAB — HIV ANTIBODY (ROUTINE TESTING W REFLEX): HIV 1&2 Ab, 4th Generation: NONREACTIVE

## 2014-07-11 LAB — HEMOGLOBIN A1C
Hgb A1c MFr Bld: 6.2 % — ABNORMAL HIGH (ref <5.7)
Mean Plasma Glucose: 131 mg/dL — ABNORMAL HIGH (ref <117)

## 2014-07-11 LAB — GC/CHLAMYDIA PROBE AMP
CT Probe RNA: NEGATIVE
GC Probe RNA: NEGATIVE

## 2014-07-11 LAB — RPR

## 2014-09-04 ENCOUNTER — Encounter (HOSPITAL_COMMUNITY): Payer: Self-pay | Admitting: Emergency Medicine

## 2014-09-04 ENCOUNTER — Emergency Department (HOSPITAL_COMMUNITY)
Admission: EM | Admit: 2014-09-04 | Discharge: 2014-09-04 | Disposition: A | Discharge status: Nursing Home (Includes ICF) | Payer: BLUE CROSS/BLUE SHIELD | Source: Home / Self Care | Attending: Family Medicine | Admitting: Family Medicine

## 2014-09-04 ENCOUNTER — Emergency Department (HOSPITAL_COMMUNITY)
Admission: EM | Admit: 2014-09-04 | Discharge: 2014-09-04 | Disposition: A | Discharge status: Home / Self Care | Payer: BLUE CROSS/BLUE SHIELD | Attending: Emergency Medicine | Admitting: Emergency Medicine

## 2014-09-04 DIAGNOSIS — R079 Chest pain, unspecified: Secondary | ICD-10-CM | POA: Insufficient documentation

## 2014-09-04 DIAGNOSIS — Z8709 Personal history of other diseases of the respiratory system: Secondary | ICD-10-CM | POA: Diagnosis not present

## 2014-09-04 DIAGNOSIS — R110 Nausea: Secondary | ICD-10-CM | POA: Insufficient documentation

## 2014-09-04 DIAGNOSIS — K219 Gastro-esophageal reflux disease without esophagitis: Secondary | ICD-10-CM | POA: Insufficient documentation

## 2014-09-04 DIAGNOSIS — I10 Essential (primary) hypertension: Secondary | ICD-10-CM | POA: Diagnosis not present

## 2014-09-04 DIAGNOSIS — Z79899 Other long term (current) drug therapy: Secondary | ICD-10-CM | POA: Diagnosis not present

## 2014-09-04 DIAGNOSIS — R0602 Shortness of breath: Secondary | ICD-10-CM | POA: Diagnosis present

## 2014-09-04 MED ORDER — ASPIRIN 81 MG PO CHEW
CHEWABLE_TABLET | ORAL | Status: AC
  Filled 2014-09-04: qty 4

## 2014-09-04 MED ORDER — LANSOPRAZOLE 15 MG PO CPDR: 15.0000 mg | DELAYED_RELEASE_CAPSULE | Freq: Every day | ORAL | Status: DC

## 2014-09-04 MED ORDER — NITROGLYCERIN 0.4 MG SL SUBL
SUBLINGUAL_TABLET | SUBLINGUAL | Status: AC
  Filled 2014-09-04: qty 1

## 2014-09-04 MED ORDER — NITROGLYCERIN 0.4 MG SL SUBL
0.4000 mg | SUBLINGUAL_TABLET | Freq: Once | SUBLINGUAL | Status: AC
  Administered 2014-09-04: 0.4 mg via SUBLINGUAL

## 2014-09-04 MED ORDER — ASPIRIN 81 MG PO CHEW
324.0000 mg | CHEWABLE_TABLET | Freq: Once | ORAL | Status: AC
  Administered 2014-09-04: 324 mg via ORAL

## 2014-09-04 MED ORDER — SODIUM CHLORIDE 0.9 % IV SOLN
INTRAVENOUS | Status: DC
  Administered 2014-09-04: 15:00:00 via INTRAVENOUS

## 2014-09-04 NOTE — Discharge Instructions (Signed)
Gastroesophageal Reflux Disease, Adult °Gastroesophageal reflux disease (GERD) happens when acid from your stomach goes into your food pipe (esophagus). The acid can cause a burning feeling in your chest. Over time, the acid can make small holes (ulcers) in your food pipe.  °HOME CARE °· Ask your doctor for advice about: °¨ Losing weight. °¨ Quitting smoking. °¨ Alcohol use. °· Avoid foods and drinks that make your problems worse. You may want to avoid: °¨ Caffeine and alcohol. °¨ Chocolate. °¨ Mints. °¨ Garlic and onions. °¨ Spicy foods. °¨ Citrus fruits, such as oranges, lemons, or limes. °¨ Foods that contain tomato, such as sauce, chili, salsa, and pizza. °¨ Fried and fatty foods. °· Avoid lying down for 3 hours before you go to bed or before you take a nap. °· Eat small meals often, instead of large meals. °· Wear loose-fitting clothing. Do not wear anything tight around your waist. °· Raise (elevate) the head of your bed 6 to 8 inches with wood blocks. Using extra pillows does not help. °· Only take medicines as told by your doctor. °· Do not take aspirin or ibuprofen. °GET HELP RIGHT AWAY IF:  °· You have pain in your arms, neck, jaw, teeth, or back. °· Your pain gets worse or changes. °· You feel sick to your stomach (nauseous), throw up (vomit), or sweat (diaphoresis). °· You feel short of breath, or you pass out (faint). °· Your throw up is green, yellow, black, or looks like coffee grounds or blood. °· Your poop (stool) is red, bloody, or black. °MAKE SURE YOU:  °· Understand these instructions. °· Will watch your condition. °· Will get help right away if you are not doing well or get worse. °Document Released: 11/30/2007 Document Revised: 09/05/2011 Document Reviewed: 12/31/2010 °ExitCare® Patient Information ©2015 [employer]. This information is not intended to replace advice given to you by your health care provider. Make sure you discuss any questions you have with your health care provider. ° °

## 2014-09-04 NOTE — ED Notes (Signed)
[facility] and difficulty sleeping at night. Pt A&OX4, NAD noted. VSS. 20g IV noted in the hand

## 2014-09-04 NOTE — ED Notes (Signed)
NAD noted. Pt denies any other complaints. Pt given prescription. Pt ambulatory on discharge. Pt refused wheelchair.

## 2014-09-04 NOTE — ED Provider Notes (Signed)
CSN: [id number]     Arrival date & time 09/04/14  1544 History   First MD Initiated Contact with Patient 09/04/14 1548     Chief Complaint  Patient presents with  . Shortness of Breath     (Consider location/radiation/quality/duration/timing/severity/associated sxs/prior Treatment) Patient is a 29 y.o. male presenting with shortness of breath. The history is provided by the patient and the EMS personnel.  Shortness of Breath Severity:  Mild Onset quality:  Gradual Duration:  3 days Timing:  Intermittent Progression:  Waxing and waning Chronicity:  New Context comment:  At night, with laying down Relieved by:  None tried Worsened by:  Nothing tried Ineffective treatments:  None tried Associated symptoms: chest pain   Associated symptoms: no abdominal pain, no cough, no sore throat, no vomiting and no wheezing   Risk factors: no hx of PE/DVT     Past Medical History  Diagnosis Date  . Hypertension   . Seasonal allergies   . GERD (gastroesophageal reflux disease)   . Influenza 2015    hospitalization, syncope due to illness   Past Surgical History  Procedure Laterality Date  . Fracture surgery Right     wrist   Family History  Problem Relation Age of Onset  . Diabetes Other   . Depression Mother   . Hypertension Father   . Cancer Maternal Aunt     breast  . ALS Maternal Grandfather   . Stroke Neg Hx   . Cancer Maternal Aunt     breast  . Heart disease Cousin 50   History  Substance Use Topics  . Smoking status: Never Smoker   . Smokeless tobacco: Never Used  . Alcohol Use: 0.6 oz/week    1 Glasses of wine per week    Review of Systems  HENT: Negative for sore throat.   Respiratory: Positive for shortness of breath. Negative for cough, chest tightness and wheezing.   Cardiovascular: Positive for chest pain.  Gastrointestinal: Positive for nausea. Negative for vomiting, abdominal pain, diarrhea and constipation.  All other systems reviewed and are  negative.     Allergies  Review of patient's allergies indicates no known allergies.  Home Medications   Prior to Admission medications   Medication Sig Start Date End Date Taking? Authorizing Provider  docusate sodium (COLACE) 100 MG capsule Take 100 mg by mouth daily as needed for mild constipation.    Historical Provider, MD  imiquimod (ALDARA) 5 % cream Apply topically 3 (three) times a week. 06/25/14   Kermit Balo Tysinger, PA-C  lansoprazole (PREVACID) 15 MG capsule Take 1 capsule (15 mg total) by mouth daily at 12 noon. 09/04/14   Dorna Leitz, MD  minocycline (MINOCIN) 100 MG capsule Take 1 capsule (100 mg total) by mouth daily. 07/10/14   Kermit Balo Tysinger, PA-C  phenylephrine-shark liver oil-mineral oil-petrolatum (PREPARATION H) 0.25-3-14-71.9 % rectal ointment Place 1 application rectally 2 (two) times daily as needed for hemorrhoids.    Historical Provider, MD   BP 135/80 mmHg  Pulse 66  Temp(Src) 98.8 F (37.1 C) (Oral)  Resp 21  SpO2 99% Physical Exam  Constitutional: He appears well-developed and well-nourished. No distress.  HENT:  Head: Normocephalic and atraumatic.  Mouth/Throat: Oropharynx is clear and moist. No oropharyngeal exudate.  Eyes: Pupils are equal, round, and reactive to light.  Cardiovascular: Normal rate, regular rhythm, normal heart sounds and intact distal pulses.  Exam reveals no gallop and no friction rub.   No murmur heard. Pulmonary/Chest: Effort normal  and breath sounds normal. No respiratory distress. He has no wheezes. He has no rales.  Abdominal: Soft. He exhibits no distension and no mass. There is no tenderness. There is no rebound and no guarding.  Musculoskeletal: Normal range of motion. He exhibits no edema or tenderness.  Skin: Skin is warm and dry. No rash noted. He is not diaphoretic.  Psychiatric: He has a normal mood and affect. His behavior is normal. Judgment and thought content normal.  Nursing note and vitals reviewed.   ED  Course  Procedures (including critical care time) Labs Review Labs Reviewed - No data to display  Imaging Review No results found.   EKG Interpretation None      MDM   Forest Beckerarl R Noe is a 29 y.o. male who presents with chest pain. Only at night and with laying flat. Improved with maalox and sitting up. Also with left and right arm tingling that he woke up with, which he has had in the past, and resolved with time.  He is an otherwise healthy male with chest pain completely consistent with his previous, known GERD. Stopped prevacid two weeks ago and now is having symptoms again. EKG with TWI in septal lead, but otherwise unremarkable. No risk factors for ACS and history is completely consistent with GERD. No further workup indicated in this asymptomatic patient. Will treat with prevacid and PCP follow up with ED return precautions.  Final diagnoses:  Gastroesophageal reflux disease without esophagitis       Dorna LeitzAlex Vadie Principato, MD 09/04/14 1633  Dorna LeitzAlex Takiyah Bohnsack, MD 09/04/14 1634  Azalia BilisKevin Campos, MD 09/04/14 1637  Azalia BilisKevin Campos, MD 09/04/14 [id number]

## 2014-09-04 NOTE — ED Notes (Signed)
Pt c/o  Having trouble sleeping the past couple nights.   States that with lying down noticed that I am having trouble breathing.    Recently taken off BP meds.  Hx of being pre-DM.  No relief with otc meds. Symptoms present x 2 days.

## 2014-09-04 NOTE — ED Provider Notes (Signed)
CSN: [id number]     Arrival date & time 09/04/14  1321 History   First MD Initiated Contact with Patient 09/04/14 1432     Chief Complaint  Patient presents with  . Sleeping Problem  . Shortness of Breath   (Consider location/radiation/quality/duration/timing/severity/associated sxs/prior Treatment) HPI Comments: Symptoms are made worse with exertion, or when lying flat or on left side. States discomfort woke him from sleep last night and was associated with tingling sensation in his left hand.  Does endorse hx of GERD and has not been taking his prevacid.  PCP: PFM  Patient is a 29 y.o. male presenting with chest pain. The history is provided by the patient.  Chest Pain Pain location:  L chest Pain quality: pressure and tightness   Pain radiates to:  L arm Pain radiates to the back: no   Pain severity:  Moderate Onset quality:  Gradual Duration:  2 days Progression:  Waxing and waning Chronicity:  New Associated symptoms: dizziness, fatigue, headache, nausea, near-syncope and orthopnea   Associated symptoms: no abdominal pain, no back pain, no cough, no palpitations, no shortness of breath, no syncope, not vomiting and no weakness     Past Medical History  Diagnosis Date  . Hypertension   . Seasonal allergies   . GERD (gastroesophageal reflux disease)   . Influenza 2015    hospitalization, syncope due to illness   Past Surgical History  Procedure Laterality Date  . Fracture surgery Right     wrist   Family History  Problem Relation Age of Onset  . Diabetes Other   . Depression Mother   . Hypertension Father   . Cancer Maternal Aunt     breast  . ALS Maternal Grandfather   . Stroke Neg Hx   . Cancer Maternal Aunt     breast  . Heart disease Cousin 50   History  Substance Use Topics  . Smoking status: Never Smoker   . Smokeless tobacco: Never Used  . Alcohol Use: 0.6 oz/week    1 Glasses of wine per week    Review of Systems  Constitutional: Positive for  fatigue.  Eyes: Negative.   Respiratory: Positive for chest tightness. Negative for cough, shortness of breath and wheezing.   Cardiovascular: Positive for chest pain, orthopnea and near-syncope. Negative for palpitations, leg swelling and syncope.  Gastrointestinal: Positive for nausea. Negative for vomiting and abdominal pain.  Musculoskeletal: Negative for back pain.  Skin: Negative.   Neurological: Positive for dizziness, light-headedness and headaches. Negative for weakness.    Allergies  Review of patient's allergies indicates no known allergies.  Home Medications   Prior to Admission medications   Medication Sig Start Date End Date Taking? Authorizing Provider  docusate sodium (COLACE) 100 MG capsule Take 100 mg by mouth daily as needed for mild constipation.    Historical Provider, MD  imiquimod (ALDARA) 5 % cream Apply topically 3 (three) times a week. 06/25/14   Kermit Baloavid S Tysinger, PA-C  lansoprazole (PREVACID) 15 MG capsule Take 15 mg by mouth daily at 12 noon.    Historical Provider, MD  minocycline (MINOCIN) 100 MG capsule Take 1 capsule (100 mg total) by mouth daily. 07/10/14   Kermit Baloavid S Tysinger, PA-C  phenylephrine-shark liver oil-mineral oil-petrolatum (PREPARATION H) 0.25-3-14-71.9 % rectal ointment Place 1 application rectally 2 (two) times daily as needed for hemorrhoids.    Historical Provider, MD   BP 114/78 mmHg  Pulse 78  Temp(Src) 98.2 F (36.8 C) (Oral)  Resp  16  SpO2 98% Physical Exam  Constitutional: He is oriented to person, place, and time. He appears well-developed and well-nourished.  HENT:  Head: Normocephalic and atraumatic.  Eyes: No scleral icterus.  Neck: Normal range of motion. Neck supple. No JVD present.  Cardiovascular: Normal rate, regular rhythm and normal heart sounds.   Pulmonary/Chest: Effort normal and breath sounds normal. No stridor. No respiratory distress. He has no wheezes. He exhibits no tenderness.  Abdominal: Soft. Bowel sounds  are normal. He exhibits no distension. There is no tenderness.  Musculoskeletal: Normal range of motion.  Neurological: He is alert and oriented to person, place, and time.  Skin: Skin is warm and dry.  Psychiatric: He has a normal mood and affect. His behavior is normal.  Nursing note and vitals reviewed.   ED Course  Procedures (including critical care time) Labs Review Labs Reviewed - No data to display  Imaging Review No results found.   MDM   1. Chest pain, unspecified chest pain type    ECG: Sinus bradycardia @ 58 bpm with inverted T wave in lead III. No ectopy. Normal intervals. No changes when compared to tracing from 06/11/2013. No STEMI. Hemodynamically stable While portions of history suggest possibility of uncontrolled GERD, additional portions of Hx are also concerning for new onset angina. Patient placed on cardiac monitor, IV NS placed, given 324 mg of aspirin and 0.4mg  SL NTG as he reports active left chest pressure and tightness with associated lightheadedness while at [facility]. Will be transported to [facility] ER for further evaluation via EMS      Ria Clock, PA 09/04/14 1537

## 2014-12-01 ENCOUNTER — Ambulatory Visit (INDEPENDENT_AMBULATORY_CARE_PROVIDER_SITE_OTHER): Payer: BLUE CROSS/BLUE SHIELD | Admitting: Internal Medicine

## 2014-12-01 VITALS — BP 148/88 | HR 72 | Temp 98.7°F | Resp 16 | Ht 70.0 in | Wt 264.0 lb

## 2014-12-01 DIAGNOSIS — K2100 Gastro-esophageal reflux disease with esophagitis, without bleeding: Secondary | ICD-10-CM

## 2014-12-01 DIAGNOSIS — R51 Headache: Secondary | ICD-10-CM

## 2014-12-01 DIAGNOSIS — R0789 Other chest pain: Secondary | ICD-10-CM | POA: Diagnosis not present

## 2014-12-01 DIAGNOSIS — R519 Headache, unspecified: Secondary | ICD-10-CM

## 2014-12-01 DIAGNOSIS — R5383 Other fatigue: Secondary | ICD-10-CM

## 2014-12-01 DIAGNOSIS — D509 Iron deficiency anemia, unspecified: Secondary | ICD-10-CM | POA: Diagnosis not present

## 2014-12-01 DIAGNOSIS — R739 Hyperglycemia, unspecified: Secondary | ICD-10-CM

## 2014-12-01 LAB — POCT CBC
Granulocyte percent: 57 %G (ref 37–80)
HCT, POC: 43.4 % — AB (ref 43.5–53.7)
Hemoglobin: 13.5 g/dL — AB (ref 14.1–18.1)
Lymph, poc: 2.5 (ref 0.6–3.4)
MCH, POC: 21.7 pg — AB (ref 27–31.2)
MCHC: 31.1 g/dL — AB (ref 31.8–35.4)
MCV: 69.9 fL — AB (ref 80–97)
MID (cbc): 0.2 (ref 0–0.9)
MPV: 7.4 fL (ref 0–99.8)
POC Granulocyte: 3.5 (ref 2–6.9)
POC LYMPH PERCENT: 39.6 %L (ref 10–50)
POC MID %: 3.4 %M (ref 0–12)
Platelet Count, POC: 278 10*3/uL (ref 142–424)
RBC: 6.21 M/uL — AB (ref 4.69–6.13)
RDW, POC: 14.9 %
WBC: 6.2 10*3/uL (ref 4.6–10.2)

## 2014-12-01 LAB — POCT GLYCOSYLATED HEMOGLOBIN (HGB A1C): Hemoglobin A1C: 5.6

## 2014-12-01 MED ORDER — CYCLOBENZAPRINE HCL 10 MG PO TABS: 10.0000 mg | ORAL_TABLET | Freq: Every day | ORAL | Status: DC

## 2014-12-01 MED ORDER — ESOMEPRAZOLE MAGNESIUM 40 MG PO CPDR: 40.0000 mg | DELAYED_RELEASE_CAPSULE | Freq: Every day | ORAL | Status: DC

## 2014-12-01 NOTE — Progress Notes (Addendum)
Subjective:  This chart was scribed for Ryan Siaobert Glory Graefe, MD by Stann Oresung-Kai Tsai, Medical Scribe. This patient was seen in Room 8 and the patient's care was started at 8:11 PM.     Patient ID: Ryan Rowe, male    DOB: January 13, 1986, 29 y.o.   MRN: [id number]  HPI Ryan Rowe is a 29 y.o. male who [facility] complaining of gradual onset headache and chest pain beginning 2-3 weeks ago. Patient noticed both happening around the same time when he is just sitting.   Headache He states the headaches were intermittent, however today it has stayed around all day. He has been taking Excedrin to some relief. The headache is located all over, starts early in the day and lasts all day, is not associated with vision changes, dizziness, nausea or vomiting. He does feel tightness in his neck and has a history of headaches that are thought to be due to a neck problem currently being treated by his chiropractor on intermittent basis. No prior history of migraine headaches.  He notes having some cough and rhinorrhea that he attributes to seasonal allergens. He denies  fever and sore throat or other recent infection symptoms.   Chest Pain For the past few weeks he has intermittent mid and left anterior chest pain. This occurs at rest. There is no exertional chest pain. He has history of GERD which has not been well controlled despite Prevacid. Note ER visits in chart regarding this symptom .  He has not been exercising lately/ no injury to the chest. He also reports having asthma in the past but has not needed treatment in many years. He denies tachycardia, dyspnea on exertion, palpitations, nocturnal dyspnea.   He also has decreased appetite recently, but He denies weight loss.     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  He sees his chiropractor named Dr. Jesusita Okaan.  His great grandmother had heart problems. He reports most people in his family has history  of GERD.   Patient Active Problem List   Diagnosis Date Noted  . CN (constipation) 04/21/2014  . Essential hypertension, benign----he was diagnosed this 3 years ago and started on lisinopril however he is rarely taking it and has many normal blood pressures in office visits recorded in this chart  04/21/2014  . Esophageal reflux 04/21/2014  . External hemorrhoid 04/21/2014   There is no history of sickle cell trait in either parent and both have been tested Note past borderline hemoglobins slightly low indices Note hemoglobin A1c close to abnormal on 2 occasions  Current outpatient prescriptions:  .  lansoprazole (PREVACID) 15 MG capsule, Take 1 capsule (15 mg total) by mouth daily at 12 noon., Disp: 30 capsule,    Review of Systems  Constitutional: Positive for appetite change. Negative for fever and unexpected weight change.  HENT: Positive for rhinorrhea. Negative for sore throat.   Eyes: Negative for visual disturbance.  Respiratory: Positive for cough and shortness of breath.   Cardiovascular: Positive for chest pain.  Gastrointestinal: Negative for nausea.  Neurological: Positive for headaches.       Objective:   Physical Exam BP 148/88 mmHg  Pulse 72  Temp(Src) 98.7 F (37.1 C) (Oral)  Resp 16  Ht 5\' 10"  (1.778 m)  Wt 264 lb (119.75 kg)  BMI 37.88 kg/m2  SpO2 98% Well developed and well nourished with no acute distress PERRLA with EOMs conjugate HEENT clear including no thyroid  nodules The neck has full range of motion but is tender in the paraCervical muscles into the right trapezius Heart is regular without murmur rub or gallop Lungs are clear to auscultation Chest wall nontender to palpation and range of motion The abdomen is benign without masses tenderness organomegaly Extremities clear with full peripheral pulses Neurological intact Psychiatric stable  Results for orders placed or performed in visit on 12/01/14  POCT CBC  Result Value Ref Range   WBC  6.2 4.6 - 10.2 K/uL   Lymph, poc 2.5 0.6 - 3.4   POC LYMPH PERCENT 39.6 10 - 50 %L   MID (cbc) 0.2 0 - 0.9   POC MID % 3.4 0 - 12 %M   POC Granulocyte 3.5 2 - 6.9   Granulocyte percent 57.0 37 - 80 %G   RBC 6.21 (A) 4.69 - 6.13 M/uL   Hemoglobin 13.5 (A) 14.1 - 18.1 g/dL   HCT, POC 28.4 (A) 13.2 - 53.7 %   MCV 69.9 (A) 80 - 97 fL   MCH, POC 21.7 (A) 27 - 31.2 pg   MCHC 31.1 (A) 31.8 - 35.4 g/dL   RDW, POC 44.0 %   Platelet Count, POC 278 142 - 424 K/uL   MPV 7.4 0 - 99.8 fL  POCT glycosylated hemoglobin (Hb A1C)  Result Value Ref Range   Hemoglobin A1C 5.6        Assessment & Plan:  chest pain secondary to Gastroesophageal reflux disease with esophagitis--- because of the persistence of this even with some PPI blockade he needs endoscopy will be referred to Dr. Loreta Ave   episodic headache? Related to neck muscle spasm --- Flexeril at bedtime plus range of motion exercises and follow-up if not well in 2-4 weeks  Other fatigue   Hyperglycemia - in the past with borderline A1c although reassured by 2 nights lab values  Microcytic anemia--neither parent has sickle cell trait so will order iron studies  History of elevated blood pressure without clear diagnosis of hypertension  Meds ordered this encounter  Medications  . esomeprazole (NEXIUM) 40 MG capsule    Sig: Take 1 capsule (40 mg total) by mouth daily.    Dispense:  30 capsule    Refill:  3  . cyclobenzaprine (FLEXERIL) 10 MG tablet    Sig: Take 1 tablet (10 mg total) by mouth at bedtime.    Dispense:  14 tablet    Refill:  0    I have completed the patient encounter in its entirety as documented by the scribe, with editing by me where necessary. Tiffanie Blassingame P. Merla Riches, M.D.

## 2014-12-02 LAB — COMPREHENSIVE METABOLIC PANEL
ALT: 19 U/L (ref 0–53)
AST: 16 U/L (ref 0–37)
Albumin: 4.3 g/dL (ref 3.5–5.2)
Alkaline Phosphatase: 47 U/L (ref 39–117)
BUN: 15 mg/dL (ref 6–23)
CO2: 28 mEq/L (ref 19–32)
Calcium: 9.7 mg/dL (ref 8.4–10.5)
Chloride: 103 mEq/L (ref 96–112)
Creat: 1.11 mg/dL (ref 0.50–1.35)
Glucose, Bld: 100 mg/dL — ABNORMAL HIGH (ref 70–99)
Potassium: 4.3 mEq/L (ref 3.5–5.3)
Sodium: 139 mEq/L (ref 135–145)
Total Bilirubin: 0.3 mg/dL (ref 0.2–1.2)
Total Protein: 7.4 g/dL (ref 6.0–8.3)

## 2014-12-02 LAB — IRON AND TIBC
%SAT: 20 % (ref 20–55)
Iron: 63 ug/dL (ref 42–165)
TIBC: 309 ug/dL (ref 215–435)
UIBC: 246 ug/dL (ref 125–400)

## 2015-02-21 ENCOUNTER — Ambulatory Visit (INDEPENDENT_AMBULATORY_CARE_PROVIDER_SITE_OTHER): Payer: BLUE CROSS/BLUE SHIELD | Admitting: Emergency Medicine

## 2015-02-21 ENCOUNTER — Ambulatory Visit (INDEPENDENT_AMBULATORY_CARE_PROVIDER_SITE_OTHER): Payer: BLUE CROSS/BLUE SHIELD

## 2015-02-21 VITALS — BP 128/80 | HR 86 | Temp 98.4°F | Resp 17 | Ht 70.5 in | Wt 258.0 lb

## 2015-02-21 DIAGNOSIS — R079 Chest pain, unspecified: Secondary | ICD-10-CM

## 2015-02-21 DIAGNOSIS — K219 Gastro-esophageal reflux disease without esophagitis: Secondary | ICD-10-CM | POA: Diagnosis not present

## 2015-02-21 MED ORDER — MELOXICAM 7.5 MG PO TABS: 7.5000 mg | ORAL_TABLET | Freq: Every day | ORAL | Status: DC

## 2015-02-21 NOTE — Patient Instructions (Signed)
Please take the mobic once daily for the next few weeks for likely muscle strain. Your ekg and xray were normal today. We have referred you to cardiology for further work up.

## 2015-02-21 NOTE — Progress Notes (Signed)
   Subjective:    Patient ID: Ryan Rowe, male    DOB: [birth year], 29 y.o.   MRN: [id number]  Chief Complaint  Patient presents with  . Chest Pain   Medications, allergies, past medical history, surgical history, family history, social history and problem list reviewed and updated.  HPI  61 yom presents with left sided cp.   Sx started one month ago gradually. No trauma. Has been constant past month, baseline 1/10. At times can worsen to approx 7/10 which can last for 2-3 mins then improve. No particular aggravating factors. Sometimes worse when sitting in chair at night.   Active with running and lifting weights which does not worsen cp. No unusual sob. Denies palps, unilateral leg pain/swelling, fevers, chills, cough. CP not worse with deep breaths.   Has hx gerd. Takes nexium qd. Had egd 2 months ago which was normal. Does not think cp is assoc with meals.   Maintenance worker. Unsure if he is doing new activity which has aggravated the site.   Review of Systems See HPI     Objective:   Physical Exam  Constitutional:  BP 128/80 mmHg  Pulse 86  Temp(Src) 98.4 F (36.9 C) (Oral)  Resp 17  Ht 5' 10.5" (1.791 m)  Wt 258 lb (117.028 kg)  BMI 36.48 kg/m2  SpO2 98%   Cardiovascular: Normal rate, regular rhythm and normal heart sounds.   Pulmonary/Chest: Effort normal and breath sounds normal. No tachypnea. He exhibits tenderness.    Reproducible ttp left chest.    [facility] reading (PRIMARY) by  Dr. Cleta Alberts. Chest : Normal cxr. Do you see any signs of pneumopericardium?   EKG: Normal     Assessment & Plan:   Chest pain, unspecified chest pain type - Plan: EKG 12-Lead, DG Chest 2 View  Gastroesophageal reflux disease without esophagitis --doubt cardiac cause with no exertioanl cp, no assoc sob, young male with minimal risk factors, cp reproducible with palpation on exam, and normal cxr and ekg  --benign exam today --continue nexium qd for gerd --suspect muscular strain,  mobic qd for one month --referral placed to dr Jacinto Halim cardiology for further work up for persistent cp despite normal work up  Ryan Lopes, PA-C Physician Assistant-Certified [facility]  02/21/2015 4:32 PM

## 2015-09-22 ENCOUNTER — Ambulatory Visit (INDEPENDENT_AMBULATORY_CARE_PROVIDER_SITE_OTHER): Payer: BLUE CROSS/BLUE SHIELD | Admitting: Emergency Medicine

## 2015-09-22 ENCOUNTER — Ambulatory Visit (INDEPENDENT_AMBULATORY_CARE_PROVIDER_SITE_OTHER): Payer: BLUE CROSS/BLUE SHIELD

## 2015-09-22 VITALS — BP 137/80 | HR 60 | Temp 97.9°F | Resp 16 | Ht 70.0 in | Wt 257.0 lb

## 2015-09-22 DIAGNOSIS — Z0000 Encounter for general adult medical examination without abnormal findings: Secondary | ICD-10-CM

## 2015-09-22 DIAGNOSIS — M79601 Pain in right arm: Secondary | ICD-10-CM | POA: Diagnosis not present

## 2015-09-22 DIAGNOSIS — M79604 Pain in right leg: Secondary | ICD-10-CM

## 2015-09-22 DIAGNOSIS — Z13220 Encounter for screening for lipoid disorders: Secondary | ICD-10-CM

## 2015-09-22 DIAGNOSIS — K625 Hemorrhage of anus and rectum: Secondary | ICD-10-CM | POA: Diagnosis not present

## 2015-09-22 DIAGNOSIS — Z113 Encounter for screening for infections with a predominantly sexual mode of transmission: Secondary | ICD-10-CM

## 2015-09-22 LAB — POCT CBC
Granulocyte percent: 56.9 %G (ref 37–80)
HCT, POC: 44.5 % (ref 43.5–53.7)
Hemoglobin: 15 g/dL (ref 14.1–18.1)
Lymph, poc: 1.9 (ref 0.6–3.4)
MCH, POC: 23 pg — AB (ref 27–31.2)
MCHC: 33.6 g/dL (ref 31.8–35.4)
MCV: 68.5 fL — AB (ref 80–97)
MID (cbc): 0.2 (ref 0–0.9)
MPV: 7.1 fL (ref 0–99.8)
POC Granulocyte: 2.8 (ref 2–6.9)
POC LYMPH PERCENT: 38.8 %L (ref 10–50)
POC MID %: 4.3 %M (ref 0–12)
Platelet Count, POC: 280 10*3/uL (ref 142–424)
RBC: 6.5 M/uL — AB (ref 4.69–6.13)
RDW, POC: 14.3 %
WBC: 5 10*3/uL (ref 4.6–10.2)

## 2015-09-22 LAB — POCT URINALYSIS DIP (MANUAL ENTRY)
Bilirubin, UA: NEGATIVE
Blood, UA: NEGATIVE
Glucose, UA: NEGATIVE
Ketones, POC UA: NEGATIVE
Leukocytes, UA: NEGATIVE
Nitrite, UA: NEGATIVE
Protein Ur, POC: NEGATIVE
Spec Grav, UA: 1.02
Urobilinogen, UA: 0.2
pH, UA: 5.5

## 2015-09-22 LAB — GLUCOSE, POCT (MANUAL RESULT ENTRY): POC Glucose: 101 mg/dl — AB (ref 70–99)

## 2015-09-22 NOTE — Progress Notes (Addendum)
Patient ID: Ryan Rowe, male   DOB: [birth year], 30 y.o.   MRN: [id number]    By signing my name below, I, Essence Howell, attest that this documentation has been prepared under the direction and in the presence of Collene Gobble, MD Electronically Signed: Charline Bills, ED Scribe 09/22/2015 at 12:37 PM.  Chief Complaint:  Chief Complaint  Patient presents with  . Annual Exam  . Leg Pain    right leg, x 3 days   HPI: Ryan Rowe is a 30 y.o. male, with a h/o HTN, who reports to [facility] today for an annual exam.   Right Leg Pain Pt presents with posterior right leg pain that radiates into his calf and right foot for the past 3 days. Pt states that he traveled to [place], [place] 4 days ago. He reports h/o similar pain that self-resolved. No h/o PE/DVT. Pt denies injury.    Constipation  Pt reports persistent constipation for a few days. He reports associated blood in stool for a few days as well. Pt reports similar symptoms with hemorrhoids several years ago. He further reports h/o GERD but states that this has been under control since he has not had any fried foods.   Cardiology  Pt followed up with Dr. Jacinto Halim after his visit in the office in 01/2015 for chest pain. He states that he had a normal visit and does not need to return.    Past Medical History  Diagnosis Date  . Hypertension   . Seasonal allergies   . GERD (gastroesophageal reflux disease)   . Influenza 2015    hospitalization, syncope due to illness   Past Surgical History  Procedure Laterality Date  . Fracture surgery Right     wrist   Social History   Social History  . Marital Status: Single    Spouse Name: N/A  . Number of Children: N/A  . Years of Education: N/A   Social History Main Topics  . Smoking status: Never Smoker   . Smokeless tobacco: Never Used  . Alcohol Use: 0.6 oz/week    1 Glasses of wine per week  . Drug Use: No  . Sexual Activity: Yes   Other Topics Concern  . 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  Single, no children.  Exercise - regularly. Diet - healthy.      Family History  Problem Relation Age of Onset  . Diabetes Other   . Depression Mother   . Hypertension Father   . Cancer Maternal Aunt     breast  . ALS Maternal Grandfather   . Stroke Neg Hx   . Cancer Maternal Aunt     breast  . Heart disease Cousin 50   No Known Allergies Prior to Admission medications   Medication Sig Start Date End Date Taking? Authorizing Provider  lansoprazole (PREVACID) 15 MG capsule Take 1 capsule (15 mg total) by mouth daily at 12 noon. 09/04/14  Yes Dorna Leitz, MD  esomeprazole (NEXIUM) 40 MG capsule Take 1 capsule (40 mg total) by mouth daily. Patient not taking: Reported on 09/22/2015 12/01/14   Tonye Pearson, MD  meloxicam (MOBIC) 7.5 MG tablet Take 1 tablet (7.5 mg total) by mouth daily. Patient not taking: Reported on 09/22/2015 02/21/15   Raelyn Ensign, PA  phenylephrine-shark liver oil-mineral oil-petrolatum (PREPARATION H) 0.25-3-14-71.9 % rectal ointment Place 1 application rectally 2 (two) times daily as needed for hemorrhoids. Reported  on 09/22/2015    Historical Provider, MD   ROS: The patient denies fevers, chills, night sweats, unintentional weight loss, chest pain, palpitations, wheezing, dyspnea on exertion, nausea, vomiting, abdominal pain, dysuria, hematuria, melena, numbness, weakness, or tingling. +myalgias, +constipation, +blood in stools  All other systems have been reviewed and were otherwise negative with the exception of those mentioned in the HPI and as above.    PHYSICAL EXAM: Filed Vitals:   09/22/15 1126  BP: 137/80  Pulse: 60  Temp: 97.9 F (36.6 C)  Resp: 16   Body mass index is 36.88 kg/(m^2).  General: Alert, no acute distress HEENT:  Normocephalic, atraumatic, oropharynx patent. Eye: Nonie Hoyer [facility] Cardiovascular: Regular rate and rhythm, no rubs murmurs or gallops. No Carotid  bruits, radial pulse intact. No pedal edema.  Respiratory: Clear to auscultation bilaterally. No wheezes, rales, or rhonchi. No cyanosis, no use of accessory musculature Abdominal: No organomegaly, abdomen is soft and non-tender, positive bowel sounds. No masses. Musculoskeletal: Tenderness over the lower R hamstring approximately 4 inches above popliteal fossa. No calf tenderness. Negative Homans. Pulses are 2+ and symmetrical. Reflexes are 3+ and symmetrical. No weakness.  GU: Normal prostate exam. Normal anal exam.  Skin: No rashes. Neurologic: Facial musculature symmetric.  Psychiatric: Patient acts appropriately throughout our interaction. Lymphatic: No cervical or submandibular lymphadenopathy  LABS:  Results for orders placed or performed in visit on 09/22/15  POCT CBC  Result Value Ref Range   WBC 5.0 4.6 - 10.2 K/uL   Lymph, poc 1.9 0.6 - 3.4   POC LYMPH PERCENT 38.8 10 - 50 %L   MID (cbc) 0.2 0 - 0.9   POC MID % 4.3 0 - 12 %M   POC Granulocyte 2.8 2 - 6.9   Granulocyte percent 56.9 37 - 80 %G   RBC 6.50 (A) 4.69 - 6.13 M/uL   Hemoglobin 15.0 14.1 - 18.1 g/dL   HCT, POC 16.1 09.6 - 53.7 %   MCV 68.5 (A) 80 - 97 fL   MCH, POC 23.0 (A) 27 - 31.2 pg   MCHC 33.6 31.8 - 35.4 g/dL   RDW, POC 04.5 %   Platelet Count, POC 280 142 - 424 K/uL   MPV 7.1 0 - 99.8 fL  POCT glucose (manual entry)  Result Value Ref Range   POC Glucose 101 (A) 70 - 99 mg/dl  POCT urinalysis dipstick  Result Value Ref Range   Color, UA yellow yellow   Clarity, UA clear clear   Glucose, UA negative negative   Bilirubin, UA negative negative   Ketones, POC UA negative negative   Spec Grav, UA 1.020    Blood, UA negative negative   pH, UA 5.5    Protein Ur, POC negative negative   Urobilinogen, UA 0.2    Nitrite, UA Negative Negative   Leukocytes, UA Negative Negative    EKG/XRAY:   Primary read interpreted by Dr. Cleta Alberts at [facility]. Dg Femur, Min 2 Views Right  09/22/2015  CLINICAL DATA:   Hamstring pain.  No known injury P EXAM: RIGHT FEMUR 2 VIEWS COMPARISON:  None. FINDINGS: There is no evidence of fracture or other focal bone lesions. Soft tissues are unremarkable. IMPRESSION: Negative. Electronically Signed   By: Charlett Nose M.D.   On: 09/22/2015 12:18   ASSESSMENT/PLAN:  Hemoglobin normal at 15 but indices are microcytic. His pain in the right leg appears muscular. Will treat with nonsteroidals. He was advised to use hemorrhoidal cream for his rectal bleeding routine  STD screening was done.I personally performed the services described in this documentation, which was scribed in my presence. The recorded information has been reviewed and is accurate.   Gross sideeffects, risk and benefits, and alternatives of medications d/w patient. Patient is aware that all medications have potential sideeffects and we are unable to predict every sideeffect or drug-drug interaction that may occur.  Lesle ChrisSteven Nicolena Schurman MD 09/22/2015 11:40 AM

## 2015-09-22 NOTE — Patient Instructions (Addendum)
IF you received an x-ray today, you will receive an invoice from [facility]. Please contact [facility] at [phone] with questions or concerns regarding your invoice.   IF you received labwork today, you will receive an invoice from [employer]. Please contact [facility] at [phone] with questions or concerns regarding your invoice.   Our billing staff will not be able to assist you with questions regarding bills from these companies.  You will be contacted with the lab results as soon as they are available. The fastest way to get your results is to activate your My Chart account. Instructions are located on the last page of this paperwork. If you have not heard from [place] regarding the results in 2 weeks, please contact this office.      hamstringHealth Maintenance, Male A healthy lifestyle and preventative care can promote health and wellness.  Maintain regular health, dental, and eye exams.  Eat a healthy diet. Foods like vegetables, fruits, whole grains, low-fat dairy products, and lean protein foods contain the nutrients you need and are low in calories. Decrease your intake of foods high in solid fats, added sugars, and salt. Get information about a proper diet from your health care provider, if necessary.  Regular physical exercise is one of the most important things you can do for your health. Most adults should get at least 150 minutes of moderate-intensity exercise (any activity that increases your heart rate and causes you to sweat) each week. In addition, most adults need muscle-strengthening exercises on 2 or more days a week.   Maintain a healthy weight. The body mass index (BMI) is a screening tool to identify possible weight problems. It provides an estimate of body fat based on height and weight. Your health care provider can find your BMI and can help you achieve or maintain a healthy weight. For males 20 years and  older:  A BMI below 18.5 is considered underweight.  A BMI of 18.5 to 24.9 is normal.  A BMI of 25 to 29.9 is considered overweight.  A BMI of 30 and above is considered obese.  Maintain normal blood lipids and cholesterol by exercising and minimizing your intake of saturated fat. Eat a balanced diet with plenty of fruits and vegetables. Blood tests for lipids and cholesterol should begin at age 69 and be repeated every 5 years. If your lipid or cholesterol levels are high, you are over age 46, or you are at high risk for heart disease, you may need your cholesterol levels checked more frequently.Ongoing high lipid and cholesterol levels should be treated with medicines if diet and exercise are not working.  If you smoke, find out from your health care provider how to quit. If you do not use tobacco, do not start.  Lung cancer screening is recommended for adults aged 55-80 years who are at high risk for developing lung cancer because of a history of smoking. A yearly low-dose CT scan of the lungs is recommended for people who have at least a 30-pack-year history of smoking and are current smokers or have quit within the past 15 years. A pack year of smoking is smoking an average of 1 pack of cigarettes a day for 1 year (for example, a 30-pack-year history of smoking could mean smoking 1 pack a day for 30 years or 2 packs a day for 15 years). Yearly screening should continue until the smoker has stopped smoking for at least 15 years. Yearly screening should  be stopped for people who develop a health problem that would prevent them from having lung cancer treatment.  If you choose to drink alcohol, do not have more than 2 drinks per day. One drink is considered to be 12 oz (360 mL) of beer, 5 oz (150 mL) of wine, or 1.5 oz (45 mL) of liquor.  Avoid the use of street drugs. Do not share needles with anyone. Ask for help if you need support or instructions about stopping the use of drugs.  High  blood pressure causes heart disease and increases the risk of stroke. High blood pressure is more likely to develop in:  People who have blood pressure in the end of the normal range (100-139/85-89 mm Hg).  People who are overweight or obese.  People who are African American.  If you are 60-77 years of age, have your blood pressure checked every 3-5 years. If you are 15 years of age or older, have your blood pressure checked every year. You should have your blood pressure measured twice--once when you are at a hospital or clinic, and once when you are not at a hospital or clinic. Record the average of the two measurements. To check your blood pressure when you are not at a hospital or clinic, you can use:  An automated blood pressure machine at a pharmacy.  A home blood pressure monitor.  If you are 33-44 years old, ask your health care provider if you should take aspirin to prevent heart disease.  Diabetes screening involves taking a blood sample to check your fasting blood sugar level. This should be done once every 3 years after age 83 if you are at a normal weight and without risk factors for diabetes. Testing should be considered at a younger age or be carried out more frequently if you are overweight and have at least 1 risk factor for diabetes.  Colorectal cancer can be detected and often prevented. Most routine colorectal cancer screening begins at the age of 110 and continues through age 73. However, your health care provider may recommend screening at an earlier age if you have risk factors for colon cancer. On a yearly basis, your health care provider may provide home test kits to check for hidden blood in the stool. A small camera at the end of a tube may be used to directly examine the colon (sigmoidoscopy or colonoscopy) to detect the earliest forms of colorectal cancer. Talk to your health care provider about this at age 16 when routine screening begins. A direct exam of the colon  should be repeated every 5-10 years through age 33, unless early forms of precancerous polyps or small growths are found.  People who are at an increased risk for hepatitis B should be screened for this virus. You are considered at high risk for hepatitis B if:  You were born in a country where hepatitis B occurs often. Talk with your health care provider about which countries are considered high risk.  Your parents were born in a high-risk country and you have not received a shot to protect against hepatitis B (hepatitis B vaccine).  You have HIV or AIDS.  You use needles to inject street drugs.  You live with, or have sex with, someone who has hepatitis B.  You are a man who has sex with other men (MSM).  You get hemodialysis treatment.  You take certain medicines for conditions like cancer, organ transplantation, and autoimmune conditions.  Hepatitis C blood testing is  recommended for all people born from [address][birth year] through 1965 and any individual with known risk factors for hepatitis C.  Healthy men should no longer receive prostate-specific antigen (PSA) blood tests as part of routine cancer screening. Talk to your health care provider about prostate cancer screening.  Testicular cancer screening is not recommended for adolescents or adult males who have no symptoms. Screening includes self-exam, a health care provider exam, and other screening tests. Consult with your health care provider about any symptoms you have or any concerns you have about testicular cancer.  Practice safe sex. Use condoms and avoid high-risk sexual practices to reduce the spread of sexually transmitted infections (STIs).  You should be screened for STIs, including gonorrhea and chlamydia if:  You are sexually active and are younger than 24 years.  You are older than 24 years, and your health care provider tells you that you are at risk for this type of infection.  Your sexual activity has changed since you  were last screened, and you are at an increased risk for chlamydia or gonorrhea. Ask your health care provider if you are at risk.  If you are at risk of being infected with HIV, it is recommended that you take a prescription medicine daily to prevent HIV infection. This is called pre-exposure prophylaxis (PrEP). You are considered at risk if:  You are a man who has sex with other men (MSM).  You are a heterosexual man who is sexually active with multiple partners.  You take drugs by injection.  You are sexually active with a partner who has HIV.  Talk with your health care provider about whether you are at high risk of being infected with HIV. If you choose to begin PrEP, you should first be tested for HIV. You should then be tested every 3 months for as long as you are taking PrEP.  Use sunscreen. Apply sunscreen liberally and repeatedly throughout the day. You should seek shade when your shadow is shorter than you. Protect yourself by wearing long sleeves, pants, a wide-brimmed hat, and sunglasses year round whenever you are outdoors.  Tell your health care provider of new moles or changes in moles, especially if there is a change in shape or color. Also, tell your health care provider if a mole is larger than the size of a pencil eraser.  A one-time screening for abdominal aortic aneurysm (AAA) and surgical repair of large AAAs by ultrasound is recommended for men aged 65-75 years who are current or former smokers.  Stay current with your vaccines (immunizations).   This information is not intended to replace advice given to you by your health care provider. Make sure you discuss any questions you have with your health care provider.   Document Released: 12/10/2007 Document Revised: 07/04/2014 Document Reviewed: 11/08/2010 Elsevier Interactive Patient Education 2016 Elsevier Inc. Hamstring Strain A hamstring strain is an injury that occurs when the hamstring muscles are overstretched  or overloaded. The hamstring muscles are a group of muscles at the back of the thighs. These muscles are used in straightening the hips, bending the knees, and pulling back the legs. This type of injury is often called a pulled hamstring muscle. The severity of a muscle strain is rated in degrees. First-degree strains have the least amount of muscle fiber tearing and pain. Second-degree and third-degree strains have increasingly more tearing and pain. CAUSES Hamstring strains occur when a sudden, violent force is placed on these muscles and stretches them too far. This  often occurs during activities that involve running, jumping, kicking, or weight lifting. RISK FACTORS Hamstring strains are especially common in athletes. Other things that can increase your risk for this injury include:  Having low strength, endurance, or flexibility of the hamstring muscles.  Performing high-impact physical activity.  Having poor physical fitness.  Having a previous leg injury.  Having fatigued muscles.  Older age. SIGNS AND SYMPTOMS  Pain in the back of the thigh.  Bruising.  Swelling.  Muscle spasm.  Difficulty using the muscle because of pain or lack of normal function. For severe strains, you may have a popping or snapping feeling when the injury occurs. DIAGNOSIS Your health care provider will perform a physical exam and ask about your medical history.  TREATMENT Often, the best treatment for a hamstring strain is protecting, resting, icing, applying compression, and elevating the injured area. This is referred to as the PRICE method of treatment. Your health care provider may also recommend medicines to help reduce pain or inflammation. HOME CARE INSTRUCTIONS  Use the PRICE method of treatment to promote muscle healing during the first 2-3 days after your injury. The PRICE method involves:  P--Protecting the muscle from being injured again.  R--Restricting your activity and resting the  injured body part.  I--Icing your injury. To do this, put ice in a plastic bag. Place a towel between your skin and the bag. Then, apply the ice and leave it on for 20 minutes, 2-3 times per day. After the third day, switch to moist heat packs.  C--Applying compression to the injured area with an elastic bandage. Be careful not to wrap it too tightly. That may interfere with blood circulation or may increase swelling.  E--Elevating the injured body part above the level of your heart as often as you can. You can do this by putting a pillow under your thigh when you sit or lie down.  Take medicines only as directed by your health care provider.  Begin exercising or stretching as directed by your health care provider.  Do not return to full activity level until your health care provider approves.  Keep all follow-up visits as directed by your health care provider. This is important. SEEK MEDICAL CARE IF:  You have increasing pain or swelling in the injured area.  You have numbness, tingling, or a significant loss of strength in the injured area.  Your foot or your toes become cold or turn blue.   This information is not intended to replace advice given to you by your health care provider. Make sure you discuss any questions you have with your health care provider.   Document Released: 03/08/2001 Document Revised: 07/04/2014 Document Reviewed: 01/27/2014 Elsevier Interactive Patient Education [employer]

## 2015-09-23 LAB — HEPATITIS C ANTIBODY: HCV Ab: NEGATIVE

## 2015-09-23 LAB — COMPLETE METABOLIC PANEL WITH GFR
ALT: 16 U/L (ref 9–46)
AST: 16 U/L (ref 10–40)
Albumin: 4.8 g/dL (ref 3.6–5.1)
Alkaline Phosphatase: 45 U/L (ref 40–115)
BUN: 18 mg/dL (ref 7–25)
CO2: 27 mmol/L (ref 20–31)
Calcium: 10.1 mg/dL (ref 8.6–10.3)
Chloride: 102 mmol/L (ref 98–110)
Creat: 1.14 mg/dL (ref 0.60–1.35)
GFR, Est African American: >89 mL/min (ref >=60)
GFR, Est Non African American: 86 mL/min (ref >=60)
Glucose, Bld: 93 mg/dL (ref 65–99)
Potassium: 4.5 mmol/L (ref 3.5–5.3)
Sodium: 138 mmol/L (ref 135–146)
Total Bilirubin: 0.5 mg/dL (ref 0.2–1.2)
Total Protein: 8.1 g/dL (ref 6.1–8.1)

## 2015-09-23 LAB — HIV ANTIBODY (ROUTINE TESTING W REFLEX): HIV 1&2 Ab, 4th Generation: NONREACTIVE

## 2015-09-23 LAB — LIPID PANEL
Cholesterol: 186 mg/dL (ref 125–200)
HDL: 35 mg/dL — ABNORMAL LOW (ref >=40)
LDL Cholesterol: 137 mg/dL — ABNORMAL HIGH (ref <130)
Total CHOL/HDL Ratio: 5.3 Ratio — ABNORMAL HIGH (ref <=5.0)
Triglycerides: 70 mg/dL (ref <150)
VLDL: 14 mg/dL (ref <30)

## 2015-09-23 LAB — GC/CHLAMYDIA PROBE AMP
CT Probe RNA: NOT DETECTED
GC Probe RNA: NOT DETECTED

## 2015-09-23 LAB — RPR

## 2015-09-24 ENCOUNTER — Telehealth: Payer: Self-pay

## 2015-09-24 NOTE — Telephone Encounter (Signed)
Pt is looking for lab results  Best number [phone]

## 2015-09-25 NOTE — Telephone Encounter (Signed)
In lab pool.

## 2015-12-19 ENCOUNTER — Ambulatory Visit (INDEPENDENT_AMBULATORY_CARE_PROVIDER_SITE_OTHER): Payer: BLUE CROSS/BLUE SHIELD | Admitting: Family Medicine

## 2015-12-19 VITALS — BP 124/74 | HR 68 | Temp 98.1°F | Resp 17 | Ht 71.5 in | Wt 258.0 lb

## 2015-12-19 DIAGNOSIS — H6123 Impacted cerumen, bilateral: Secondary | ICD-10-CM

## 2015-12-19 DIAGNOSIS — H9201 Otalgia, right ear: Secondary | ICD-10-CM | POA: Diagnosis not present

## 2015-12-19 NOTE — Patient Instructions (Addendum)
     IF you received an x-ray today, you will receive an invoice from [facility]. Please contact [facility] at [phone] with questions or concerns regarding your invoice.   IF you received labwork today, you will receive an invoice from [facility]. Please contact [facility] at [phone] with questions or concerns regarding your invoice.   Our billing staff will not be able to assist you with questions regarding bills from these companies.  You will be contacted with the lab results as soon as they are available. The fastest way to get your results is to activate your My Chart account. Instructions are located on the last page of this paperwork. If you have not heard from us regarding the results in 2 weeks, please contact this office.    Cerumen Impaction The structures of the external ear canal secrete a waxy substance known as cerumen. Excess cerumen can build up in the ear canal, causing a condition known as cerumen impaction. Cerumen impaction can cause ear pain and disrupt the function of the ear. The rate of cerumen production differs for each individual. In certain individuals, the configuration of the ear canal may decrease his or her ability to naturally remove cerumen. CAUSES Cerumen impaction is caused by excessive cerumen production or buildup. RISK FACTORS  Frequent use of swabs to clean ears.  Having narrow ear canals.  Having eczema.  Being dehydrated. SIGNS AND SYMPTOMS  Diminished hearing.  Ear drainage.  Ear pain.  Ear itch. TREATMENT Treatment may involve:  Over-the-counter or prescription ear drops to soften the cerumen.  Removal of cerumen by a health care provider. This may be done with:  Irrigation with warm water. This is the most common method of removal.  Ear curettes and other instruments.  Surgery. This may be done in severe cases. HOME CARE INSTRUCTIONS  Take medicines only as directed by  your health care provider.  Do not insert objects into the ear with the intent of cleaning the ear. PREVENTION  Do not insert objects into the ear, even with the intent of cleaning the ear. Removing cerumen as a part of normal hygiene is not necessary, and the use of swabs in the ear canal is not recommended.  Drink enough water to keep your urine clear or pale yellow.  Control your eczema if you have it. SEEK MEDICAL CARE IF:  You develop ear pain.  You develop bleeding from the ear.  The cerumen does not clear after you use ear drops as directed.   This information is not intended to replace advice given to you by your health care provider. Make sure you discuss any questions you have with your health care provider.   Document Released: 07/21/2004 Document Revised: 07/04/2014 Document Reviewed: 01/28/2015 Elsevier Interactive Patient Education 2016 Elsevier Inc.  

## 2015-12-19 NOTE — Progress Notes (Signed)
   Subjective:    Patient ID: Ryan Rowe, male    DOB: [birth year], 30 y.o.   MRN: [id number] By signing my name below, I, Ryan Rowe, attest that this documentation has been prepared under the direction and in the presence of Norberto SorensonEva Shaw, MD. Electronically Signed: Javier Dockerobert Ryan Rowe, ER Scribe. 12/19/2015. 9:15 AM.  Chief Complaint  Patient presents with  . Ear Pain    right ear drainahe for three days   HPI HPI Comments: Ryan Rowe is a 30 y.o. male who [facility] complaining of right ear pain and the feeling of blockage for the last three days. He used debrox ear drops yesterday without relief, and he woke up this morning and his sx felt worse. He denies sinus drainage, sore throat, fever, chills or allergies.    Past Medical History  Diagnosis Date  . Hypertension   . Seasonal allergies   . GERD (gastroesophageal reflux disease)   . Influenza 2015    hospitalization, syncope due to illness   No Known Allergies  Current Outpatient Prescriptions on File Prior to Visit  Medication Sig Dispense Refill  . lansoprazole (PREVACID) 15 MG capsule Take 1 capsule (15 mg total) by mouth daily at 12 noon. 30 capsule 2   No current facility-administered medications on file prior to visit.    Review of Systems  Constitutional: Negative for fever and chills.  HENT: Positive for ear pain. Negative for congestion and ear discharge.   Neurological: Negative for dizziness, light-headedness and headaches.      Objective:  BP 124/74 mmHg  Pulse 68  Temp(Src) 98.1 F (36.7 C) (Oral)  Resp 17  Ht 5' 11.5" (1.816 m)  Wt 258 lb (117.028 kg)  BMI 35.49 kg/m2  SpO2 99%  Physical Exam  Constitutional: He is oriented to person, place, and time. He appears well-developed and well-nourished. No distress.  HENT:  Head: Normocephalic and atraumatic.  Soft brown cerumen moderate amount on left, occluding on right. Nares oropharynx normal.  Eyes: Pupils are equal, round, and reactive to  light.  Neck: Neck supple.  Normal thyroid. No pre, post, or cercival adenopathy.   Cardiovascular: Normal rate.   Pulmonary/Chest: Effort normal. No respiratory distress.  Musculoskeletal: Normal range of motion.  Neurological: He is alert and oriented to person, place, and time. Coordination normal.  Skin: Skin is warm and dry. He is not diaphoretic.  Psychiatric: He has a normal mood and affect. His behavior is normal.  Nursing note and vitals reviewed.     Assessment & Plan:   1. Cerumen impaction, bilateral   Pt tolerated well.  Cont debrox x5d each mo.  Orders Placed This Encounter  Procedures  . Ear wax removal    Primarily right, some on left    I personally performed the services described in this documentation, which was scribed in my presence. The recorded information has been reviewed and considered, and addended by me as needed.   Norberto SorensonEva Shaw, M.D.  [facility]  [place] [address] [place], [place] [address] [phone] phone [phone] fax  12/19/2015 5:14 PM

## 2016-05-02 ENCOUNTER — Ambulatory Visit (INDEPENDENT_AMBULATORY_CARE_PROVIDER_SITE_OTHER): Payer: BLUE CROSS/BLUE SHIELD | Admitting: Family Medicine

## 2016-05-02 VITALS — BP 120/90 | HR 98 | Temp 98.6°F | Resp 16 | Ht 70.0 in | Wt 256.8 lb

## 2016-05-02 DIAGNOSIS — R51 Headache: Secondary | ICD-10-CM

## 2016-05-02 DIAGNOSIS — Z6836 Body mass index (BMI) 36.0-36.9, adult: Secondary | ICD-10-CM

## 2016-05-02 DIAGNOSIS — M791 Myalgia, unspecified site: Secondary | ICD-10-CM

## 2016-05-02 DIAGNOSIS — R509 Fever, unspecified: Secondary | ICD-10-CM | POA: Diagnosis not present

## 2016-05-02 DIAGNOSIS — I10 Essential (primary) hypertension: Secondary | ICD-10-CM

## 2016-05-02 DIAGNOSIS — IMO0001 Reserved for inherently not codable concepts without codable children: Secondary | ICD-10-CM

## 2016-05-02 DIAGNOSIS — E6609 Other obesity due to excess calories: Secondary | ICD-10-CM

## 2016-05-02 DIAGNOSIS — R519 Headache, unspecified: Secondary | ICD-10-CM

## 2016-05-02 LAB — POCT INFLUENZA A/B
Influenza A, POC: NEGATIVE
Influenza B, POC: NEGATIVE

## 2016-05-02 NOTE — Progress Notes (Signed)
Chief Complaint  Patient presents with  . Cough    x2 days  . body aches    x 4 days    HPI  Flu Like Symptoms Pt reports that he has 4 days of fevers as high as 100 to 101 at night with muscle aches and pains.  He received his seasonal flu shot. 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   He reports that he has a nonproductive cough He has been self medicating with lozenges and cough meds like nyquil. He denies nausea and vomiting. He has been remaining hydrating. He also reports nighttime headaches but denies sinus congestion or ringing in his ears.  Hypertension He has a history of hypertension and reports that he checks his blood pressure at work His diastolic bp has been increasing into the 90s range He states that he thinks this is from eating fast food and not getting enough rest  Obesity He exercises every day by coaching football He states that his diet needs to be improved He drinks plenty of water He is very active at work  [employer] Readings from Last 3 Encounters:  05/02/16 256 lb 12.8 oz (116.5 kg)  12/19/15 258 lb (117 kg)  09/22/15 257 lb (116.6 kg)     Past Medical History:  Diagnosis Date  . GERD (gastroesophageal reflux disease)   . Hypertension   . Influenza 2015   hospitalization, syncope due to illness  . Seasonal allergies     Current Outpatient Prescriptions  Medication Sig Dispense Refill  . lansoprazole (PREVACID) 15 MG capsule Take 1 capsule (15 mg total) by mouth daily at 12 noon. (Patient not taking: Reported on 05/02/2016) 30 capsule 2   No current facility-administered medications for this visit.     Allergies: No Known Allergies  Past Surgical History:  Procedure Laterality Date  . FRACTURE SURGERY Right    wrist    Social History   Social History  . Marital status: Single    Spouse name: N/A  . Number of children: N/A  . Years of education: N/A   Social History Main Topics  . Smoking status:  Never Smoker  . Smokeless tobacco: Never Used  . Alcohol use 0.6 oz/week    1 Glasses of wine per week  . Drug use: No  . Sexual activity: Yes   Other Topics Concern  . 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  Single, no children.  Exercise - regularly. Diet - healthy.       ROS  Objective: Vitals:   05/02/16 0821  BP: 120/90  Pulse: 98  Resp: 16  Temp: 98.6 F (37 C)  TempSrc: Oral  SpO2: 97%  Weight: 256 lb 12.8 oz (116.5 kg)  Height: 5\' 10"  (1.778 m)    Physical Exam General: alert, oriented, in NAD Head: normocephalic, atraumatic, no sinus tenderness Eyes: EOM intact, no scleral icterus or conjunctival injection Ears: TM clear bilaterally Throat: no pharyngeal exudate or erythema Lymph: no posterior auricular, submental or cervical lymph adenopathy Heart: normal rate, normal sinus rhythm, no murmurs Lungs: clear to auscultation bilaterally, no wheezing   Assessment and Plan Ryan Rowe was seen today for cough and body aches.  Diagnoses and all orders for this visit:  Fever and chills Increased frequency of headaches Myalgia Influenza negative Advised supportive care Continue hydration    -     POCT Influenza A/B  Essential hypertension- deterioration  of the diastolic blood pressure Discussed DASH diet Meal prepping and stress management Will monitor and if the bp is remaining in the diastolic range above 90 will start medications  Class 2 obesity due to excess calories with serious comorbidity and body mass index (BMI) of 36.0 to 36.9 in adult -  Advised a 10 pound weight loss to start as a means of reducing the risk of worsening hypertension  Increased frequency of headaches Advised to check bp before bed Also stop the cough suppressants Increase hydration nsaids prn  Bradin Mcadory A Mckennah Kretchmer

## 2016-05-02 NOTE — Patient Instructions (Addendum)
   IF you received an x-ray today, you will receive an invoice from [facility]. Please contact [facility] at [phone] with questions or concerns regarding your invoice.   IF you received labwork today, you will receive an invoice from [facility]. Please contact [facility] at [phone] with questions or concerns regarding your invoice.   Our billing staff will not be able to assist you with questions regarding bills from these companies.  You will be contacted with the lab results as soon as they are available. The fastest way to get your results is to activate your My Chart account. Instructions are located on the last page of this paperwork. If you have not heard from us regarding the results in 2 weeks, please contact this office.     DASH Eating Plan DASH stands for "Dietary Approaches to Stop Hypertension." The DASH eating plan is a healthy eating plan that has been shown to reduce high blood pressure (hypertension). Additional health benefits may include reducing the risk of type 2 diabetes mellitus, heart disease, and stroke. The DASH eating plan may also help with weight loss. WHAT DO I NEED TO KNOW ABOUT THE DASH EATING PLAN? For the DASH eating plan, you will follow these general guidelines:  Choose foods with a percent daily value for sodium of less than 5% (as listed on the food label).  Use salt-free seasonings or herbs instead of table salt or sea salt.  Check with your health care provider or pharmacist before using salt substitutes.  Eat lower-sodium products, often labeled as "lower sodium" or "no salt added."  Eat fresh foods.  Eat more vegetables, fruits, and low-fat dairy products.  Choose whole grains. Look for the word "whole" as the first word in the ingredient list.  Choose fish and skinless chicken or turkey more often than red meat. Limit fish, poultry, and meat to 6 oz (170 g) each day.  Limit sweets,  desserts, sugars, and sugary drinks.  Choose heart-healthy fats.  Limit cheese to 1 oz (28 g) per day.  Eat more home-cooked food and less restaurant, buffet, and fast food.  Limit fried foods.  Cook foods using methods other than frying.  Limit canned vegetables. If you do use them, rinse them well to decrease the sodium.  When eating at a restaurant, ask that your food be prepared with less salt, or no salt if possible. WHAT FOODS CAN I EAT? Seek help from a dietitian for individual calorie needs. Grains Whole grain or whole wheat bread. Brown rice. Whole grain or whole wheat pasta. Quinoa, bulgur, and whole grain cereals. Low-sodium cereals. Corn or whole wheat flour tortillas. Whole grain cornbread. Whole grain crackers. Low-sodium crackers. Vegetables Fresh or frozen vegetables (raw, steamed, roasted, or grilled). Low-sodium or reduced-sodium tomato and vegetable juices. Low-sodium or reduced-sodium tomato sauce and paste. Low-sodium or reduced-sodium canned vegetables.  Fruits All fresh, canned (in natural juice), or frozen fruits. Meat and Other Protein Products Ground beef (85% or leaner), grass-fed beef, or beef trimmed of fat. Skinless chicken or turkey. Ground chicken or turkey. Pork trimmed of fat. All fish and seafood. Eggs. Dried beans, peas, or lentils. Unsalted nuts and seeds. Unsalted canned beans. Dairy Low-fat dairy products, such as skim or 1% milk, 2% or reduced-fat cheeses, low-fat ricotta or cottage cheese, or plain low-fat yogurt. Low-sodium or reduced-sodium cheeses. Fats and Oils Tub margarines without trans fats. Light or reduced-fat mayonnaise and salad dressings (reduced sodium). Avocado. Safflower, olive, or canola   oils. Natural peanut or almond butter. Other Unsalted popcorn and pretzels. The items listed above may not be a complete list of recommended foods or beverages. Contact your dietitian for more options. WHAT FOODS ARE NOT  RECOMMENDED? Grains White bread. White pasta. White rice. Refined cornbread. Bagels and croissants. Crackers that contain trans fat. Vegetables Creamed or fried vegetables. Vegetables in a cheese sauce. Regular canned vegetables. Regular canned tomato sauce and paste. Regular tomato and vegetable juices. Fruits Dried fruits. Canned fruit in light or heavy syrup. Fruit juice. Meat and Other Protein Products Fatty cuts of meat. Ribs, chicken wings, bacon, sausage, bologna, salami, chitterlings, fatback, hot dogs, bratwurst, and packaged luncheon meats. Salted nuts and seeds. Canned beans with salt. Dairy Whole or 2% milk, cream, half-and-half, and cream cheese. Whole-fat or sweetened yogurt. Full-fat cheeses or blue cheese. Nondairy creamers and whipped toppings. Processed cheese, cheese spreads, or cheese curds. Condiments Onion and garlic salt, seasoned salt, table salt, and sea salt. Canned and packaged gravies. Worcestershire sauce. Tartar sauce. Barbecue sauce. Teriyaki sauce. Soy sauce, including reduced sodium. Steak sauce. Fish sauce. Oyster sauce. Cocktail sauce. Horseradish. Ketchup and mustard. Meat flavorings and tenderizers. Bouillon cubes. Hot sauce. Tabasco sauce. Marinades. Taco seasonings. Relishes. Fats and Oils Butter, stick margarine, lard, shortening, ghee, and bacon fat. Coconut, palm kernel, or palm oils. Regular salad dressings. Other Pickles and olives. Salted popcorn and pretzels. The items listed above may not be a complete list of foods and beverages to avoid. Contact your dietitian for more information. WHERE CAN I FIND MORE INFORMATION? National Heart, Lung, and [employer] Institute: www.nhlbi.nih.gov/health/health-topics/topics/dash/   This information is not intended to replace advice given to you by your health care provider. Make sure you discuss any questions you have with your health care provider.   Document Released: 06/02/2011 Document Revised: 07/04/2014  Document Reviewed: 04/17/2013 Elsevier Interactive Patient Education 2016 Elsevier Inc.  

## 2016-05-05 ENCOUNTER — Encounter: Payer: Self-pay | Admitting: Family Medicine

## 2016-05-07 ENCOUNTER — Encounter (HOSPITAL_COMMUNITY): Payer: Self-pay | Admitting: Emergency Medicine

## 2016-05-07 ENCOUNTER — Ambulatory Visit (HOSPITAL_COMMUNITY)
Admission: EM | Admit: 2016-05-07 | Discharge: 2016-05-07 | Disposition: A | Discharge status: Home / Self Care | Payer: BLUE CROSS/BLUE SHIELD | Attending: Family Medicine | Admitting: Family Medicine

## 2016-05-07 DIAGNOSIS — J40 Bronchitis, not specified as acute or chronic: Secondary | ICD-10-CM | POA: Diagnosis not present

## 2016-05-07 DIAGNOSIS — J4521 Mild intermittent asthma with (acute) exacerbation: Secondary | ICD-10-CM

## 2016-05-07 MED ORDER — AZITHROMYCIN 250 MG PO TABS: 250.0000 mg | ORAL_TABLET | Freq: Every day | ORAL | 0 refills | Status: DC

## 2016-05-07 MED ORDER — ALBUTEROL SULFATE HFA 108 (90 BASE) MCG/ACT IN AERS: 2.0000 | INHALATION_SPRAY | RESPIRATORY_TRACT | 6 refills | Status: DC | PRN

## 2016-05-07 MED ORDER — HYDROCODONE-HOMATROPINE 5-1.5 MG/5ML PO SYRP: 5.0000 mL | ORAL_SOLUTION | Freq: Four times a day (QID) | ORAL | 0 refills | Status: DC | PRN

## 2016-05-07 NOTE — ED Triage Notes (Signed)
Onset of symptoms last week.  Having chills and body aches initially.  This has resolved.  Now has a cough, chest congestion, minimal phlegm.  Sometimes sees green/yellow mucus.  Symptoms overall 8 days total.  Patient has noticed change in breathing.  Patient feels congestion in chest move with inspiration and expiration.  Patient reports taking steps makes him sob.  Patient has an appt on Monday at [facility].

## 2016-05-07 NOTE — ED Provider Notes (Signed)
[facility]    CSN: [id number] Arrival date & time: 05/07/16  1203     History   Chief Complaint Chief Complaint  Patient presents with  . URI    HPI Ryan Rowe is a 30 y.o. male.   History presented at triage  Onset of symptoms last week.  Having chills and body aches initially.  This has resolved.  Now has a cough, chest congestion, minimal phlegm.  Sometimes sees green/yellow mucus.  Symptoms overall 8 days total.  Patient has noticed change in breathing.  Patient feels congestion in chest move with inspiration and expiration.  Patient reports taking steps makes him sob.  Patient has an appt on Monday at [facility].   This is a 30 year old gentleman who presents with symptoms of URI.  At first his symptoms were congestion alone. Over time, however, he's developed wheezing and violent cough.  Patient has history of asthma but has not had problems in years. He does not have an inhaler at the present time. He's been taking Mucinex which at first seemed to be helping but now is ineffective.  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 GERD (gastroesophageal reflux disease)   . Hypertension   . Influenza 2015   hospitalization, syncope due to illness  . Seasonal allergies     Patient Active Problem List   Diagnosis Date Noted  . GERD (gastroesophageal reflux disease) 02/21/2015  . CN (constipation) 04/21/2014  . Essential hypertension, benign 04/21/2014  . Esophageal reflux 04/21/2014  . External hemorrhoid 04/21/2014  . HTN (hypertension) 06/10/2013    Past Surgical History:  Procedure Laterality Date  . FRACTURE SURGERY Right    wrist       Home Medications    Prior to Admission medications   Medication Sig Start Date End Date Taking? Authorizing Provider  guaiFENesin (MUCINEX) 600 MG 12 hr tablet Take by mouth 2 (two) times daily.   Yes  Historical Provider, MD  guaiFENesin (ROBITUSSIN) 100 MG/5ML liquid Take 200 mg by mouth 3 (three) times daily as needed for cough.   Yes Historical Provider, MD  Phenylephrine-Pheniramine-DM [facility] COLD & COUGH PO) Take by mouth.   Yes Historical Provider, MD  albuterol (PROVENTIL HFA;VENTOLIN HFA) 108 (90 Base) MCG/ACT inhaler Inhale 2 puffs into the lungs every 4 (four) hours as needed for wheezing or shortness of breath (cough, shortness of breath or wheezing.). 05/07/16   Elvina Sidle, MD  azithromycin (ZITHROMAX) 250 MG tablet Take 1 tablet (250 mg total) by mouth daily. Take first 2 tablets together, then 1 every day until finished. 05/07/16   Elvina Sidle, MD  HYDROcodone-homatropine [facility]) 5-1.5 MG/5ML syrup Take 5 mLs by mouth every 6 (six) hours as needed for cough. 05/07/16   Elvina Sidle, MD    Family History Family History  Problem Relation Age of Onset  . Depression Mother   . Hypertension Father   . Diabetes Other   . Cancer Maternal Aunt     breast  . ALS Maternal Grandfather   . Cancer Maternal Aunt     breast  . Heart disease Cousin 50  . Stroke Neg Hx     Social History Social History  Substance Use Topics  . Smoking status: Never Smoker  . Smokeless tobacco: Never Used  . Alcohol use 0.6 oz/week    1 Glasses of wine per week  Allergies   Patient has no known allergies.   Review of Systems Review of Systems  Constitutional: Negative.   HENT: Positive for congestion. Negative for ear pain, sinus pressure, sneezing and sore throat.   Eyes: Negative.   Respiratory: Positive for cough, chest tightness, shortness of breath and wheezing.   Cardiovascular: Negative.   Gastrointestinal: Negative.   Genitourinary: Negative.   Musculoskeletal: Negative.   Neurological: Negative.   Psychiatric/Behavioral: Negative.      Physical Exam Triage Vital Signs ED Triage Vitals [05/07/16 1241]  Enc Vitals Group     BP 149/91     Pulse Rate 78      Resp 20     Temp 98.9 F (37.2 C)     Temp Source Oral     SpO2 100 %     Weight      Height      Head Circumference      Peak Flow      Pain Score 8     Pain Loc      Pain Edu?      Excl. in GC?    No data found.   Updated Vital Signs BP 149/91 (BP Location: Left Arm) Comment (BP Location): large cuff  Pulse 78   Temp 98.9 F (37.2 C) (Oral)   Resp 20   SpO2 100%    Physical Exam  Constitutional: He is oriented to person, place, and time. He appears well-developed and well-nourished.  HENT:  Head: Normocephalic.  Right Ear: External ear normal.  Left Ear: External ear normal.  Mouth/Throat: Oropharynx is clear and moist.  Eyes: Conjunctivae are normal. Pupils are equal, round, and reactive to light.  Neck: Normal range of motion. Neck supple.  Cardiovascular: Normal rate, regular rhythm and normal heart sounds.   Pulmonary/Chest: Effort normal. He has wheezes. He has rales.  Smiling cough Bilateral wheezes Rales in the left lower base  Musculoskeletal: Normal range of motion.  Neurological: He is alert and oriented to person, place, and time.  Skin: Skin is warm and dry.  Psychiatric: He has a normal mood and affect. His behavior is normal.  Nursing note and vitals reviewed.    UC Treatments / Results  Labs (all labs ordered are listed, but only abnormal results are displayed) Labs Reviewed - No data to display  EKG  EKG Interpretation None       Radiology No results found.  Procedures Procedures (including critical care time)  Medications Ordered in UC Medications - No data to display   Initial Impression / Assessment and Plan / UC Course  I have reviewed the triage vital signs and the nursing notes.  Pertinent labs & imaging results that were available during my care of the patient were reviewed by me and considered in my medical decision making (see chart for details).  Clinical Course     Final Clinical Impressions(s) / UC  Diagnoses   Final diagnoses:  Bronchitis  Mild intermittent asthma with exacerbation    New Prescriptions New Prescriptions   ALBUTEROL (PROVENTIL HFA;VENTOLIN HFA) 108 (90 BASE) MCG/ACT INHALER    Inhale 2 puffs into the lungs every 4 (four) hours as needed for wheezing or shortness of breath (cough, shortness of breath or wheezing.).   AZITHROMYCIN (ZITHROMAX) 250 MG TABLET    Take 1 tablet (250 mg total) by mouth daily. Take first 2 tablets together, then 1 every day until finished.   HYDROCODONE-HOMATROPINE (HYCODAN) 5-1.5 MG/5ML SYRUP    Take  5 mLs by mouth every 6 (six) hours as needed for cough.     Elvina SidleKurt Cherokee Boccio, MD 05/07/16 1308

## 2016-05-09 ENCOUNTER — Ambulatory Visit: Payer: BLUE CROSS/BLUE SHIELD

## 2016-07-18 ENCOUNTER — Ambulatory Visit (HOSPITAL_COMMUNITY)
Admission: EM | Admit: 2016-07-18 | Discharge: 2016-07-18 | Disposition: A | Discharge status: Home / Self Care | Payer: BLUE CROSS/BLUE SHIELD | Attending: Family Medicine | Admitting: Family Medicine

## 2016-07-18 ENCOUNTER — Encounter (HOSPITAL_COMMUNITY): Payer: Self-pay | Admitting: Family Medicine

## 2016-07-18 DIAGNOSIS — Z202 Contact with and (suspected) exposure to infections with a predominantly sexual mode of transmission: Secondary | ICD-10-CM | POA: Diagnosis not present

## 2016-07-18 DIAGNOSIS — J029 Acute pharyngitis, unspecified: Secondary | ICD-10-CM | POA: Diagnosis not present

## 2016-07-18 DIAGNOSIS — Z113 Encounter for screening for infections with a predominantly sexual mode of transmission: Secondary | ICD-10-CM

## 2016-07-18 MED ORDER — CEFTRIAXONE SODIUM 250 MG IJ SOLR
INTRAMUSCULAR | Status: AC
  Filled 2016-07-18: qty 250

## 2016-07-18 MED ORDER — STERILE WATER FOR INJECTION IJ SOLN
INTRAMUSCULAR | Status: AC
  Filled 2016-07-18: qty 10

## 2016-07-18 MED ORDER — CEFTRIAXONE SODIUM 250 MG IJ SOLR
250.0000 mg | Freq: Once | INTRAMUSCULAR | Status: AC
  Administered 2016-07-18: 250 mg via INTRAMUSCULAR

## 2016-07-18 MED ORDER — AZITHROMYCIN 250 MG PO TABS
1000.0000 mg | ORAL_TABLET | Freq: Once | ORAL | Status: AC
  Administered 2016-07-18: 1000 mg via ORAL

## 2016-07-18 MED ORDER — AZITHROMYCIN 250 MG PO TABS
ORAL_TABLET | ORAL | Status: AC
Start: 2016-07-18 — End: 2016-07-18
  Filled 2016-07-18: qty 4

## 2016-07-18 NOTE — ED Triage Notes (Signed)
Pt here for sore throat, small rash and elevated BP.

## 2016-07-18 NOTE — ED Provider Notes (Signed)
CSN: [id number]     Arrival date & time 07/18/16  1608 History   None    Chief Complaint  Patient presents with  . Sore Throat   (Consider location/radiation/quality/duration/timing/severity/associated sxs/prior Treatment) 31 year old presents to clinic with chief complaint of sore throat for 3 days without cough, fever, or congestion, 5/10 on pain. He also complains of small "bumbs" at the base of his penis that have been there    The history is provided by the patient.    Past Medical History:  Diagnosis Date  . GERD (gastroesophageal reflux disease)   . Hypertension   . Influenza 2015   hospitalization, syncope due to illness  . Seasonal allergies    Past Surgical History:  Procedure Laterality Date  . FRACTURE SURGERY Right    wrist   Family History  Problem Relation Age of Onset  . Depression Mother   . Hypertension Father   . Diabetes Other   . Cancer Maternal Aunt     breast  . ALS Maternal Grandfather   . Cancer Maternal Aunt     breast  . Heart disease Cousin 50  . Stroke Neg Hx    Social History  Substance Use Topics  . Smoking status: Never Smoker  . Smokeless tobacco: Never Used  . Alcohol use 0.6 oz/week    1 Glasses of wine per week    Review of Systems  HENT: Positive for sore throat. Negative for rhinorrhea, sinus pain and sinus pressure.   Respiratory: Negative.   Cardiovascular: Negative.   Gastrointestinal: Negative.   Musculoskeletal: Negative.   Neurological: Negative.   All other systems reviewed and are negative.   Allergies  Patient has no known allergies.  Home Medications   Prior to Admission medications   Medication Sig Start Date End Date Taking? Authorizing Provider  omeprazole (PRILOSEC) 10 MG capsule Take 10 mg by mouth daily.   Yes Historical Provider, MD  albuterol (PROVENTIL HFA;VENTOLIN HFA) 108 (90 Base) MCG/ACT inhaler Inhale 2 puffs into the lungs every 4 (four) hours as needed for wheezing or shortness of breath  (cough, shortness of breath or wheezing.). 05/07/16   Elvina SidleKurt Lauenstein, MD   Meds Ordered and Administered this Visit   Medications  azithromycin (ZITHROMAX) tablet 1,000 mg (not administered)  cefTRIAXone (ROCEPHIN) injection 250 mg (not administered)    BP 143/88   Pulse 74   Temp 98.6 F (37 C)   Resp 18   SpO2 100%  No data found.   Physical Exam  Constitutional: He is oriented to person, place, and time. He appears well-developed and well-nourished. No distress.  HENT:  Head: Normocephalic and atraumatic.  Right Ear: Tympanic membrane and external ear normal.  Left Ear: Tympanic membrane and external ear normal.  Nose: Nose normal. No rhinorrhea. Right sinus exhibits no maxillary sinus tenderness and no frontal sinus tenderness. Left sinus exhibits no maxillary sinus tenderness and no frontal sinus tenderness.  Mouth/Throat: Uvula is midline and oropharynx is clear and moist. No oropharyngeal exudate, posterior oropharyngeal edema, posterior oropharyngeal erythema or tonsillar abscesses.  Eyes: Conjunctivae and EOM are normal. Pupils are equal, round, and reactive to light.  Neck: Normal range of motion. Neck supple. No JVD present.  Cardiovascular: Normal rate and regular rhythm.   Pulmonary/Chest: Effort normal and breath sounds normal.  Abdominal: Soft. Bowel sounds are normal.  Genitourinary: Penis normal. Circumcised. No phimosis, paraphimosis, hypospadias, penile erythema or penile tenderness. No discharge found.     Lymphadenopathy:  He has no cervical adenopathy.  Neurological: He is alert and oriented to person, place, and time.  Skin: Skin is warm and dry. Capillary refill takes less than 2 seconds. He is not diaphoretic.  Psychiatric: He has a normal mood and affect.  Nursing note and vitals reviewed.   Urgent Care Course     Procedures (including critical care time)  Labs Review Labs Reviewed  HIV ANTIBODY (ROUTINE TESTING)  RPR  URINE CYTOLOGY  ANCILLARY ONLY  CERVICOVAGINAL ANCILLARY ONLY    Imaging Review No results found.   Visual Acuity Review  Right Eye Distance:   Left Eye Distance:   Bilateral Distance:    Right Eye Near:   Left Eye Near:    Bilateral Near:         MDM   1. Viral pharyngitis   2. Screening examination for STD (sexually transmitted disease)   For your sore throat, you must likely have a viral pharyngitis. You may use chloraseptic throat spray or lozenges for pain. Drink plenty of fluids, and rest as needed.  You have been screened for Gonorrhea, chlamydia, HIV, and syphilis. You will be notified of the test results within 24-72 hours with your results and informed of any directions or changes in therapy.  You have been treated tonight with 250 mg of Rocephin and 1 gram of Azithromycin preventatively.   Should your symptoms persist or fail to resolve follow up with your primary care provider or return to clinic.     Dorena Bodo, NP 07/18/16 1845

## 2016-07-18 NOTE — Discharge Instructions (Signed)
For your sore throat, you must likely have a viral pharyngitis. You may use chloraseptic throat spray or lozenges for pain. Drink plenty of fluids, and rest as needed.  You have been screened for Gonorrhea, chlamydia, HIV, and syphilis. You will be notified of the test results within 24-72 hours with your results and informed of any directions or changes in therapy.  You have been treated tonight with 250 mg of Rocephin and 1 gram of Azithromycin preventatively.   Should your symptoms persist or fail to resolve follow up with your primary care provider or return to clinic.

## 2016-07-19 LAB — HIV ANTIBODY (ROUTINE TESTING W REFLEX): HIV Screen 4th Generation wRfx: NONREACTIVE

## 2016-07-19 LAB — URINE CYTOLOGY ANCILLARY ONLY
Chlamydia: NEGATIVE
Neisseria Gonorrhea: NEGATIVE

## 2016-07-19 LAB — RPR: RPR Ser Ql: NONREACTIVE

## 2016-09-29 ENCOUNTER — Ambulatory Visit (INDEPENDENT_AMBULATORY_CARE_PROVIDER_SITE_OTHER): Payer: BLUE CROSS/BLUE SHIELD | Admitting: Physician Assistant

## 2016-09-29 ENCOUNTER — Other Ambulatory Visit: Payer: Self-pay | Admitting: Physician Assistant

## 2016-09-29 ENCOUNTER — Encounter: Payer: Self-pay | Admitting: Physician Assistant

## 2016-09-29 ENCOUNTER — Other Ambulatory Visit: Payer: Self-pay

## 2016-09-29 ENCOUNTER — Ambulatory Visit (INDEPENDENT_AMBULATORY_CARE_PROVIDER_SITE_OTHER): Payer: BLUE CROSS/BLUE SHIELD

## 2016-09-29 VITALS — BP 120/90 | HR 55 | Temp 98.0°F | Ht 70.0 in | Wt 262.0 lb

## 2016-09-29 DIAGNOSIS — R0789 Other chest pain: Secondary | ICD-10-CM

## 2016-09-29 DIAGNOSIS — M79661 Pain in right lower leg: Secondary | ICD-10-CM | POA: Diagnosis not present

## 2016-09-29 DIAGNOSIS — Z23 Encounter for immunization: Secondary | ICD-10-CM | POA: Diagnosis not present

## 2016-09-29 DIAGNOSIS — K2100 Gastro-esophageal reflux disease with esophagitis, without bleeding: Secondary | ICD-10-CM

## 2016-09-29 DIAGNOSIS — R7303 Prediabetes: Secondary | ICD-10-CM | POA: Diagnosis not present

## 2016-09-29 DIAGNOSIS — J4520 Mild intermittent asthma, uncomplicated: Secondary | ICD-10-CM

## 2016-09-29 DIAGNOSIS — E669 Obesity, unspecified: Secondary | ICD-10-CM | POA: Diagnosis not present

## 2016-09-29 DIAGNOSIS — M79662 Pain in left lower leg: Secondary | ICD-10-CM | POA: Diagnosis not present

## 2016-09-29 DIAGNOSIS — M79602 Pain in left arm: Secondary | ICD-10-CM

## 2016-09-29 DIAGNOSIS — R0602 Shortness of breath: Secondary | ICD-10-CM

## 2016-09-29 DIAGNOSIS — R05 Cough: Secondary | ICD-10-CM | POA: Diagnosis not present

## 2016-09-29 DIAGNOSIS — Z8679 Personal history of other diseases of the circulatory system: Secondary | ICD-10-CM | POA: Diagnosis not present

## 2016-09-29 DIAGNOSIS — Z0000 Encounter for general adult medical examination without abnormal findings: Secondary | ICD-10-CM | POA: Diagnosis not present

## 2016-09-29 LAB — LIPID PANEL
Cholesterol: 170 mg/dL (ref 0–200)
HDL: 35 mg/dL — ABNORMAL LOW (ref >39.00)
LDL Cholesterol: 123 mg/dL — ABNORMAL HIGH (ref 0–99)
NonHDL: 134.57
Total CHOL/HDL Ratio: 5
Triglycerides: 58 mg/dL (ref 0.0–149.0)
VLDL: 11.6 mg/dL (ref 0.0–40.0)

## 2016-09-29 LAB — COMPREHENSIVE METABOLIC PANEL
ALT: 29 U/L (ref 0–53)
AST: 22 U/L (ref 0–37)
Albumin: 4.6 g/dL (ref 3.5–5.2)
Alkaline Phosphatase: 46 U/L (ref 39–117)
BUN: 11 mg/dL (ref 6–23)
CO2: 33 mEq/L — ABNORMAL HIGH (ref 19–32)
Calcium: 9.8 mg/dL (ref 8.4–10.5)
Chloride: 103 mEq/L (ref 96–112)
Creatinine, Ser: 1.06 mg/dL (ref 0.40–1.50)
GFR: 104.65 mL/min (ref >60.00)
Glucose, Bld: 99 mg/dL (ref 70–99)
Potassium: 4.2 mEq/L (ref 3.5–5.1)
Sodium: 138 mEq/L (ref 135–145)
Total Bilirubin: 0.5 mg/dL (ref 0.2–1.2)
Total Protein: 7.8 g/dL (ref 6.0–8.3)

## 2016-09-29 LAB — CBC WITH DIFFERENTIAL/PLATELET
Basophils Absolute: 0 10*3/uL (ref 0.0–0.1)
Basophils Relative: 1 % (ref 0.0–3.0)
Eosinophils Absolute: 0.1 10*3/uL (ref 0.0–0.7)
Eosinophils Relative: 1.5 % (ref 0.0–5.0)
HCT: 44.5 % (ref 39.0–52.0)
Hemoglobin: 14.1 g/dL (ref 13.0–17.0)
Lymphocytes Relative: 39.3 % (ref 12.0–46.0)
Lymphs Abs: 1.6 10*3/uL (ref 0.7–4.0)
MCHC: 31.8 g/dL (ref 30.0–36.0)
MCV: 69.6 fl — ABNORMAL LOW (ref 78.0–100.0)
Monocytes Absolute: 0.3 10*3/uL (ref 0.1–1.0)
Monocytes Relative: 6.9 % (ref 3.0–12.0)
Neutro Abs: 2.1 10*3/uL (ref 1.4–7.7)
Neutrophils Relative %: 51.3 % (ref 43.0–77.0)
Platelets: 273 10*3/uL (ref 150.0–400.0)
RBC: 6.39 Mil/uL — ABNORMAL HIGH (ref 4.22–5.81)
RDW: 14.2 % (ref 11.5–15.5)
WBC: 4.1 10*3/uL (ref 4.0–10.5)

## 2016-09-29 LAB — HEMOGLOBIN A1C: Hgb A1c MFr Bld: 6.3 % (ref 4.6–6.5)

## 2016-09-29 MED ORDER — RANITIDINE HCL 150 MG PO TABS
150.0000 mg | ORAL_TABLET | Freq: Two times a day (BID) | ORAL | 1 refills | Status: DC
Start: 2016-09-29 — End: 2017-05-23

## 2016-09-29 MED ORDER — METFORMIN HCL 500 MG PO TABS: 500.0000 mg | ORAL_TABLET | Freq: Every day | ORAL | 0 refills | Status: DC

## 2016-09-29 NOTE — Progress Notes (Signed)
Ryan Rowe is a 31 y.o. male here to Establish Care and Annual Physical  History of Present Illness:   Chief Complaint  Patient presents with  . NP: Annual Exam 31 Year Old Male   . Gastroesophageal Reflux    Takes omeprazole  1qd. Wants to discuss potential long term effects.   . Multiple aches/pains    Sx started sx 2 weeks ago. Unsure of any triggers. Not working out like he use too. Location is left arm pain radiating to finger tips and chest heaviness. Intermittent SOB w and w/o exertion. Does not use inhaler when this happens.    Acute Concerns: Left arm pain -- pain throughout bicep and in middle and ring finger that lasted two days, pain has resolved and has not returned since, he did not take any medications for the pain, denies any inciting event Chest heaviness - started 2 days ago, heaviness travels around his chest, feels like "something is in my throat", heaviness lasts for a few minutes, only occurs when resting; Per chart review, patient has had multiple encounters for chest pain over the past few years. Based on evaluations, patient has had negative workup thus far. He has been told that he has GERD. He has had several EKGs. He reports that this chest pain is consistent with past episodes. Pain is worse with deep inspiration. Pain is not reproducible with touch. He has not taken anything for the pain. He has recently resumed his omeprazole to see if this would help. He has only been taking the omeprazole for a few days. He does report that his grandfather recently died, and he has some health anxiety. Intermittent SOB w/ exertion - occurs with exertion has been going on for the past two days as well, coinciding with the chest heaviness, does have a history of asthma, has not used inhaler to help with this Calf pain -- bilateral calf pain, no feet swelling, no history of blood clots, no increased swelling of one calf compared to the other, feels like he is deconditioned, is  very active at his job  Chronic Issues: Asthma -- well controlled, uses albuterol <1 x a week GERD -- takes  prilosec, has taken daily since Sunday, gets GERD with fast foods, has had an EGD in 2016, I do not have biopsy results but imaging was normal Hx of pre-diabetic -- has a history of elevated blood sugar, told he was prediabetic, last HgbA1c was 6.3 in 2015, denies any changes in urination, shaky/dizzy spells, tingling in feet Hx of HTN -- was on Lisinopril 5-6 years ago, was on it for 6 months to 1 year but changed his diet and got it under control and went off medications, does get occasional headaches  Health Maintenance: Immunizations -- needs a Tdap today Colonoscopy -- no history of colon cancer Diet -- eat a lot of fast food Caffeine intake -- recently stopped drinking caffeine Sleep habits -- 6 hours on average, does snore, does not have pauses in breathing to his knowledge Exercise -- no dedicated exercise Weight -- Weight: 262 lb (118.8 kg) -- highest weight ever, 220-230lb is his goal weight, has gained due to hectic schedule and lack of time to exercise properly, has gained about 40-50 lb in the past 10 years Mood -- good  Depression screen PHQ 2/9 05/02/2016  Decreased Interest 0  Down, Depressed, Hopeless 0  PHQ - 2 Score 0   Other providers/specialists: Solicitor - saw for GERD, had EGD completed  PMHx, SurgHx, SocialHx, Medications, and Allergies were reviewed in the Visit Navigator and updated as appropriate.  Current Medications:   Current Outpatient Prescriptions:  .  albuterol (PROVENTIL HFA;VENTOLIN HFA) 108 (90 Base) MCG/ACT inhaler, Inhale 2 puffs into the lungs every 4 (four) hours as needed for wheezing or shortness of breath (cough, shortness of breath or wheezing.)., Disp: 1 Inhaler, Rfl: 6 .  omeprazole (PRILOSEC) 10 MG capsule, Take 10 mg by mouth daily., Disp: , Rfl:  .  ranitidine (ZANTAC) 150 MG tablet, Take 1 tablet (150 mg total) by  mouth 2 (two) times daily., Disp: 60 tablet, Rfl: 1   Review of Systems:   Review of Systems  Constitutional: Negative for chills, fever and malaise/fatigue.  HENT: Negative for sinus pain.   Respiratory: Positive for shortness of breath. Negative for cough.   Cardiovascular: Positive for chest pain and palpitations. Negative for orthopnea and leg swelling.  Gastrointestinal: Positive for constipation. Negative for diarrhea, nausea and vomiting.  Genitourinary: Negative for dysuria, frequency, hematuria and urgency.  Skin: Negative for rash.  Neurological: Positive for headaches. Negative for dizziness, seizures and weakness.  Psychiatric/Behavioral: Negative for depression. The patient is not nervous/anxious.     Vitals:   Vitals:   09/29/16 0931  BP: 120/90  Pulse: (!) 55  Temp: 98 F (36.7 C)  TempSrc: Oral  SpO2: 98%  Weight: 262 lb (118.8 kg)  Height:  (1.778 m)     Body mass index is 37.59 kg/m. Obese  Physical Exam:   Physical Exam  Constitutional: He appears well-developed. He is cooperative.  Non-toxic appearance. He does not have a sickly appearance. He does not appear ill. No distress.  HENT:  Head: Normocephalic and atraumatic.  Right Ear: Tympanic membrane normal. Tympanic membrane is not erythematous, not retracted and not bulging.  Left Ear: Tympanic membrane normal. Tympanic membrane is not erythematous, not retracted and not bulging.  Nose: Nose normal.  Mouth/Throat: Uvula is midline and oropharynx is clear and moist.  Eyes: Conjunctivae, EOM and lids are normal. Pupils are equal, round, and reactive to light.  Neck: Trachea normal.  Cardiovascular: Normal rate, regular rhythm, S1 normal, S2 normal, normal heart sounds and normal pulses.   Pulmonary/Chest: Effort normal and breath sounds normal.  No reproducible chest tenderness  Abdominal: Normal appearance and bowel sounds are normal. There is no tenderness.  Musculoskeletal:  Negative  Homan's sign bilaterally, calves appear equal in size, no warmth/redness  Lymphadenopathy:    He has no cervical adenopathy.  Neurological: He is alert.  Skin: Skin is warm, dry and intact.  Psychiatric: He has a normal mood and affect. His speech is normal and behavior is normal. Thought content normal.  Nursing note and vitals reviewed.   EKG tracing is personally reviewed.  EKG notes NSR.  No acute changes.    Assessment and Plan:    Problem List Items Addressed This Visit      Respiratory   Mild intermittent asthma without complication Stable. Continue albuterol when necessary.      Digestive   GERD (gastroesophageal reflux disease) I'm going to switch patient from omeprazole to Zantac. I instructed patient to take Zantac 150 milligrams twice a day for one month, and then switch to 150 mg daily. Continue avoiding foods that trigger symptoms.    Relevant Medications   ranitidine (ZANTAC) 150 MG tablet     Other   Pre-diabetes Check hemoglobin A1c today.    Relevant Orders   HgB A1c  History of hypertension Blood pressure today is normal. Continue to monitor.     Obesity (BMI 35.0-39.9 without comorbidity) Continue working on healthy diet and exercise.    Relevant Orders   HgB A1c    Other Visit Diagnoses    Encounter for annual physical exam    -  Primary Today patient counseled on age appropriate routine health concerns for screening and prevention, each reviewed and up to date or declined. Immunizations reviewed and up to date or declined. Labs ordered and reviewed. Risk factors for depression reviewed and negative. Hearing function and visual acuity are intact. ADLs screened and addressed as needed. Functional ability and level of safety reviewed and appropriate. Education, counseling and referrals performed based on assessed risks today. Patient provided with a copy of personalized plan for preventive services.   Relevant Orders   CBC with Differential/Platelet    Comprehensive metabolic panel   Lipid panel   Need for Tdap vaccination       Relevant Orders   Tdap vaccine greater than or equal to 7yo IM (Completed)   Chest heaviness     EKG is normal. Patient does have bradycardia. I suspect that this was not cardiac related, however patient would like a second opinion with cardiology. I have put this referral and. I advised patient had chest pain changes in weight like for him to go to the ER. Chest x-ray without any abnormalities.    Relevant Orders   EKG 12-Lead (Completed)   DG Chest 2 View (Completed)   Ambulatory referral to Cardiology   Pain of left upper extremity     This has resolved. I told patient to follow-up with [place] if this returns. We'll get some blood work given he has some intermittent numbness and tingling.    SOB (shortness of breath)     Difficult to say what the etiology of this. I think it's related to some of his health anxiety, his asthma, and GERD. Chest x-ray is with out any abnormalities.    Bilateral calf pain     I suspect this is muscle strain. We'll check electrolytes. Negative Homans sign bilaterally very low risk for DVT.        Marland Kitchen Reviewed expectations re: course of current medical issues. . Discussed self-management of symptoms. . Outlined signs and symptoms indicating need for more acute intervention. . Patient verbalized understanding and all questions were answered. . See orders for this visit as documented in the electronic medical record. . Patient received an After-Visit Summary.   Jarold Motto, PA-C

## 2016-09-29 NOTE — Progress Notes (Signed)
Pre visit review using our clinic review tool, if applicable. No additional management support is needed unless otherwise documented below in the visit note. 

## 2016-09-29 NOTE — Patient Instructions (Addendum)
It was great meeting you today!  We will call you with your lab and xray results.  Start Ranitidine 150 mg twice daily for 30 days, after 30 days, go down to once monthly.  You will be called about your cardiology appointment.  If your chest discomfort changes in any way, I would like for you to go to the emergency room.   Health Maintenance, Male A healthy lifestyle and preventive care is important for your health and wellness. Ask your health care provider about what schedule of regular examinations is right for you. What should I know about weight and diet?  Eat a Healthy Diet  Eat plenty of vegetables, fruits, whole grains, low-fat dairy products, and lean protein.  Do not eat a lot of foods high in solid fats, added sugars, or salt. Maintain a Healthy Weight  Regular exercise can help you achieve or maintain a healthy weight. You should:  Do at least 150 minutes of exercise each week. The exercise should increase your heart rate and make you sweat (moderate-intensity exercise).  Do strength-training exercises at least twice a week. Watch Your Levels of Cholesterol and Blood Lipids  Have your blood tested for lipids and cholesterol every 5 years starting at 31 years of age. If you are at high risk for heart disease, you should start having your blood tested when you are 31 years old. You may need to have your cholesterol levels checked more often if:  Your lipid or cholesterol levels are high.  You are older than 31 years of age.  You are at high risk for heart disease. What should I know about cancer screening? Many types of cancers can be detected early and may often be prevented. Lung Cancer  You should be screened every year for lung cancer if:  You are a current smoker who has smoked for at least 30 years.  You are a former smoker who has quit within the past 15 years.  Talk to your health care provider about your screening options, when you should start  screening, and how often you should be screened. Colorectal Cancer  Routine colorectal cancer screening usually begins at 31 years of age and should be repeated every 5-10 years until you are 31 years old. You may need to be screened more often if early forms of precancerous polyps or small growths are found. Your health care provider may recommend screening at an earlier age if you have risk factors for colon cancer.  Your health care provider may recommend using home test kits to check for hidden blood in the stool.  A small camera at the end of a tube can be used to examine your colon (sigmoidoscopy or colonoscopy). This checks for the earliest forms of colorectal cancer. Prostate and Testicular Cancer  Depending on your age and overall health, your health care provider may do certain tests to screen for prostate and testicular cancer.  Talk to your health care provider about any symptoms or concerns you have about testicular or prostate cancer. Skin Cancer  Check your skin from head to toe regularly.  Tell your health care provider about any new moles or changes in moles, especially if:  There is a change in a mole's size, shape, or color.  You have a mole that is larger than a pencil eraser.  Always use sunscreen. Apply sunscreen liberally and repeat throughout the day.  Protect yourself by wearing long sleeves, pants, a wide-brimmed hat, and sunglasses when outside. What should  I know about heart disease, diabetes, and high blood pressure?  If you are 41-38 years of age, have your blood pressure checked every 3-5 years. If you are 25 years of age or older, have your blood pressure checked every year. You should have your blood pressure measured twice-once when you are at a hospital or clinic, and once when you are not at a hospital or clinic. Record the average of the two measurements. To check your blood pressure when you are not at a hospital or clinic, you can use:  An  automated blood pressure machine at a pharmacy.  A home blood pressure monitor.  Talk to your health care provider about your target blood pressure.  If you are between 15-52 years old, ask your health care provider if you should take aspirin to prevent heart disease.  Have regular diabetes screenings by checking your fasting blood sugar level.  If you are at a normal weight and have a low risk for diabetes, have this test once every three years after the age of 69.  If you are overweight and have a high risk for diabetes, consider being tested at a younger age or more often.  A one-time screening for abdominal aortic aneurysm (AAA) by ultrasound is recommended for men aged 65-75 years who are current or former smokers. What should I know about preventing infection? Hepatitis B  If you have a higher risk for hepatitis B, you should be screened for this virus. Talk with your health care provider to find out if you are at risk for hepatitis B infection. Hepatitis C  Blood testing is recommended for:  Everyone born from [birth year] through 1965.  Anyone with known risk factors for hepatitis C. Sexually Transmitted Diseases (STDs)  You should be screened each year for STDs including gonorrhea and chlamydia if:  You are sexually active and are younger than 31 years of age.  You are older than 31 years of age and your health care provider tells you that you are at risk for this type of infection.  Your sexual activity has changed since you were last screened and you are at an increased risk for chlamydia or gonorrhea. Ask your health care provider if you are at risk.  Talk with your health care provider about whether you are at high risk of being infected with HIV. Your health care provider may recommend a prescription medicine to help prevent HIV infection. What else can I do?  Schedule regular health, dental, and eye exams.  Stay current with your vaccines (immunizations).  Do not use  any tobacco products, such as cigarettes, chewing tobacco, and e-cigarettes. If you need help quitting, ask your health care provider.  Limit alcohol intake to no more than 2 drinks per day. One drink equals 12 ounces of beer, 5 ounces of wine, or 1 ounces of hard liquor.  Do not use street drugs.  Do not share needles.  Ask your health care provider for help if you need support or information about quitting drugs.  Tell your health care provider if you often feel depressed.  Tell your health care provider if you have ever been abused or do not feel safe at home. This information is not intended to replace advice given to you by your health care provider. Make sure you discuss any questions you have with your health care provider. Document Released: 12/10/2007 Document Revised: 02/10/2016 Document Reviewed: 03/17/2015 Elsevier Interactive Patient Education  2017 [employer].

## 2016-10-04 DIAGNOSIS — M9901 Segmental and somatic dysfunction of cervical region: Secondary | ICD-10-CM | POA: Diagnosis not present

## 2016-10-04 DIAGNOSIS — M47812 Spondylosis without myelopathy or radiculopathy, cervical region: Secondary | ICD-10-CM | POA: Diagnosis not present

## 2016-10-04 DIAGNOSIS — M542 Cervicalgia: Secondary | ICD-10-CM | POA: Diagnosis not present

## 2016-10-04 DIAGNOSIS — M9903 Segmental and somatic dysfunction of lumbar region: Secondary | ICD-10-CM | POA: Diagnosis not present

## 2016-10-10 ENCOUNTER — Encounter: Payer: Self-pay | Admitting: Physician Assistant

## 2016-10-10 ENCOUNTER — Ambulatory Visit (INDEPENDENT_AMBULATORY_CARE_PROVIDER_SITE_OTHER): Payer: BLUE CROSS/BLUE SHIELD | Admitting: Physician Assistant

## 2016-10-10 VITALS — BP 120/90 | HR 75 | Ht 70.0 in | Wt 261.0 lb

## 2016-10-10 DIAGNOSIS — R070 Pain in throat: Secondary | ICD-10-CM | POA: Diagnosis not present

## 2016-10-10 DIAGNOSIS — R7303 Prediabetes: Secondary | ICD-10-CM

## 2016-10-10 MED ORDER — PANTOPRAZOLE SODIUM 20 MG PO TBEC: 20.0000 mg | DELAYED_RELEASE_TABLET | Freq: Every day | ORAL | 1 refills | Status: DC

## 2016-10-10 NOTE — Progress Notes (Signed)
Pre visit review using our clinic review tool, if applicable. No additional management support is needed unless otherwise documented below in the visit note. 

## 2016-10-10 NOTE — Progress Notes (Signed)
Ryan Rowe is a 30 y.o. male is here to discuss:  Clovis Cao, CMA acting as Neurosurgeon for [employer], [place].  History of Present Illness:   Chief Complaint  Patient presents with  . Follow-up    Discuss Diet and Nutrition   . Throat discomfort    HPI   Patient reports he has started his metformin and is tolerating well. He denies any issues with GI side effects. He has tried to make a few diet modifications, but is still struggling with carbohydrate choices. He has limited exercise but is trying to get back into his regimen.  Breakfast: grits or oatmeal, eggs Lunch: 1 cup cranberry juice, meat, 1-2 starchy sides, 1 dessert Dinner: whatever his mom makes, usually soul food Drinks: cranberry juice and mostly water  Throat discomfort - patient reports that he has had a fullness sensation in his neck for the past two weeks. He denies unintentional weight loss. Does feel like he needs to chew his food more thoroughly to prevent getting choked. Continues to have coughing, which he contributes to his uncontrolled GERD. He is taking ranitidine 150 mg BID. He is unsure if he has had any temperature intolerances, polyuria or polyphagia. He also states that the pain can sometimes radiate to his jaw, and can worsen with chewing gum. He denies a history of TMJ.   There are no preventive care reminders to display for this patient.  PMHx, SurgHx, SocialHx, FamHx, Medications, and Allergies were reviewed in the Visit Navigator and updated as appropriate.   Patient Active Problem List   Diagnosis Date Noted  . Mild intermittent asthma without complication 09/29/2016  . Pre-diabetes 09/29/2016  . History of hypertension 09/29/2016  . Obesity (BMI 35.0-39.9 without comorbidity) 09/29/2016  . GERD (gastroesophageal reflux disease) 02/21/2015    Social History  Substance Use Topics  . Smoking status: Never Smoker  . Smokeless tobacco: Never Used  . Alcohol use Yes     Comment:  Socially    Current Medications and Allergies:    Current Outpatient Prescriptions:  .  albuterol (PROVENTIL HFA;VENTOLIN HFA) 108 (90 Base) MCG/ACT inhaler, Inhale 2 puffs into the lungs every 4 (four) hours as needed for wheezing or shortness of breath (cough, shortness of breath or wheezing.)., Disp: 1 Inhaler, Rfl: 6 .  metFORMIN (GLUCOPHAGE) 500 MG tablet, Take 1 tablet (500 mg total) by mouth daily with breakfast., Disp: 180 tablet, Rfl: 0 .  omeprazole (PRILOSEC) 10 MG capsule, Take 10 mg by mouth daily., Disp: , Rfl:  .  ranitidine (ZANTAC) 150 MG tablet, Take 1 tablet (150 mg total) by mouth 2 (two) times daily., Disp: 60 tablet, Rfl: 1 .  pantoprazole (PROTONIX) 20 MG tablet, Take 1 tablet (20 mg total) by mouth daily., Disp: 30 tablet, Rfl: 1  No Known Allergies  Review of Systems   Review of Systems  Constitutional: Negative for chills, fever and weight loss.  HENT: Negative for sore throat.   Respiratory: Positive for shortness of breath. Negative for stridor.   Cardiovascular: Negative for chest pain and palpitations.  Gastrointestinal: Positive for heartburn. Negative for abdominal pain and diarrhea.  Musculoskeletal: Positive for neck pain.  Neurological: Negative for headaches.    Vitals:   Vitals:   10/10/16 1602  BP: 120/90  Pulse: 75  SpO2: 98%  Weight: 261 lb (118.4 kg)  Height:  (1.778 m)     Body mass index is 37.45 kg/m.   Physical Exam:    Physical  Exam  Constitutional: He appears well-developed. He is cooperative.  Non-toxic appearance. He does not have a sickly appearance. He does not appear ill. No distress.  HENT:  Head: Normocephalic and atraumatic.  Nose: Nose normal.  Mouth/Throat: Uvula is midline, oropharynx is clear and moist and mucous membranes are normal. No trismus in the jaw. Normal dentition. No uvula swelling. No posterior oropharyngeal edema or posterior oropharyngeal erythema. No tonsillar exudate.  Cardiovascular:  Normal rate, regular rhythm, S1 normal, S2 normal, normal heart sounds and normal pulses.   No LE edema  Pulmonary/Chest: Effort normal and breath sounds normal.  Lymphadenopathy:    He has cervical adenopathy (generalized tenderness to cervical lymphnodes).  Neurological: He is alert.  Nursing note and vitals reviewed.   Assessment and Plan:    Rafiel was seen today for follow-up and throat discomfort.  Diagnoses and all orders for this visit:  Pre-diabetes Extensive diet education completed today. Gave patient Balanced Plate handout, Balanced Snack handout, Carbohydrate lists. Continue Metformin. We reviewed consistent, controlled carbohydrate intake throughout the day.  Throat discomfort Referral to ENT for further evaluation and treatment. Will order TSH and T4 today given his report of "fullness" to his throat. -     TSH -     T4 -     Ambulatory referral to ENT  GERD Uncontrolled. Will add PPI. -     pantoprazole (PROTONIX) 20 MG tablet; Take 1 tablet (20 mg total) by mouth daily.  . Reviewed expectations re: course of current medical issues. . Discussed self-management of symptoms. . Outlined signs and symptoms indicating need for more acute intervention. . Patient verbalized understanding and all questions were answered. . See orders for this visit as documented in the electronic medical record. . Patient received an After Visit Summary.  CMA or LPN served as scribe during this visit. History, Physical, and Plan performed by medical provider. Documentation and orders reviewed and attested to.  Jarold Motto, PA-C [place] [place] 10/10/2016  Follow-up: No Follow-up on file.

## 2016-10-10 NOTE — Addendum Note (Signed)
Addended by: Josefa Half on: 10/10/2016 05:24 PM   Modules accepted: Orders

## 2016-10-10 NOTE — Patient Instructions (Signed)
Please go to the lab for your lab work, we will call you with your results.  I have sent in a referral for Ear, Nose and Throat doctor. You will be contacted by them for an appointment. If you develop chest pain, shortness of breath or difficulty breathing, please go to the emergency room.  Start protonix daily. Continue this with your ranitidine.

## 2016-10-11 LAB — TSH: TSH: 0.91 u[IU]/mL (ref 0.35–4.50)

## 2016-10-11 LAB — T4: T4, Total: 8.4 ug/dL (ref 4.5–12.0)

## 2016-10-25 ENCOUNTER — Ambulatory Visit: Payer: BLUE CROSS/BLUE SHIELD | Admitting: Internal Medicine

## 2016-11-03 NOTE — Progress Notes (Deleted)
Cardiology Office Note   Date:  11/03/2016   ID:  Ryan Rowe, DOB September 17, 1985, MRN [id number]  PCP:  Jarold MottoWorley, Samantha, PA  Cardiologist:   Rollene RotundaJames Aylen Rambert, MD  Referring:  ***  No chief complaint on file.     History of Present Illness: Ryan Rowe is a 31 y.o. male who is referred by Jarold MottoWorley, Samantha, PA for evaluation of chest heaviness.  ***     Past Medical History:  Diagnosis Date  . GERD (gastroesophageal reflux disease)     Past Surgical History:  Procedure Laterality Date  . FRACTURE SURGERY Right    wrist     Current Outpatient Prescriptions  Medication Sig Dispense Refill  . albuterol (PROVENTIL HFA;VENTOLIN HFA) 108 (90 Base) MCG/ACT inhaler Inhale 2 puffs into the lungs every 4 (four) hours as needed for wheezing or shortness of breath (cough, shortness of breath or wheezing.). 1 Inhaler 6  . metFORMIN (GLUCOPHAGE) 500 MG tablet Take 1 tablet (500 mg total) by mouth daily with breakfast. 180 tablet 0  . omeprazole (PRILOSEC) 10 MG capsule Take 10 mg by mouth daily.    . pantoprazole (PROTONIX) 20 MG tablet Take 1 tablet (20 mg total) by mouth daily. 30 tablet 1  . ranitidine (ZANTAC) 150 MG tablet Take 1 tablet (150 mg total) by mouth 2 (two) times daily. 60 tablet 1   No current facility-administered medications for this visit.     Allergies:   Patient has no known allergies.    Social History:  The patient  reports that he has never smoked. He has never used smokeless tobacco. He reports that he drinks alcohol. He reports that he does not use drugs.   Family History:  The patient's ***family history includes ALS in his maternal grandfather; Cancer in his maternal aunt and maternal aunt; Depression in his mother; Diabetes in his maternal grandfather, mother, and other; Heart disease (age of onset: 3750) in his cousin; Hypertension in his father, paternal grandfather, and paternal grandmother.    ROS:  Please see the history of present illness.    Otherwise, review of systems are positive for {NONE DEFAULTED:18576::"none"}.   All other systems are reviewed and negative.    PHYSICAL EXAM: VS:  There were no vitals taken for this visit. , BMI There is no height or weight on file to calculate BMI. GENERAL:  Well appearing HEENT:  Pupils equal round and reactive, fundi not visualized, oral mucosa unremarkable NECK:  No jugular venous distention, waveform within normal limits, carotid upstroke brisk and symmetric, no bruits, no thyromegaly LYMPHATICS:  No cervical, inguinal adenopathy LUNGS:  Clear to auscultation bilaterally BACK:  No CVA tenderness CHEST:  Unremarkable HEART:  PMI not displaced or sustained,S1 and S2 within normal limits, no S3, no S4, no clicks, no rubs, *** murmurs ABD:  Flat, positive bowel sounds normal in frequency in pitch, no bruits, no rebound, no guarding, no midline pulsatile mass, no hepatomegaly, no splenomegaly EXT:  2 plus pulses throughout, no edema, no cyanosis no clubbing SKIN:  No rashes no nodules NEURO:  Cranial nerves II through XII grossly intact, motor grossly intact throughout PSYCH:  Cognitively intact, oriented to person place and time    EKG:  EKG {ACTION; IS/IS [id number]}OT:[id number]} ordered today. The ekg ordered today demonstrates ***   Recent Labs: 09/29/2016: ALT 29; BUN 11; Creatinine, Ser 1.06; Hemoglobin 14.1; Platelets 273.0; Potassium 4.2; Sodium 138 10/10/2016: TSH 0.91    Lipid Panel    Component  Value Date/Time   CHOL 170 09/29/2016 1004   TRIG 58.0 09/29/2016 1004   HDL 35.00 (L) 09/29/2016 1004   CHOLHDL 5 09/29/2016 1004   VLDL 11.6 09/29/2016 1004   LDLCALC 123 (H) 09/29/2016 1004      Wt Readings from Last 3 Encounters:  10/10/16 261 lb (118.4 kg)  09/29/16 262 lb (118.8 kg)  05/02/16 256 lb 12.8 oz (116.5 kg)      Other studies Reviewed: Additional studies/ records that were reviewed today include: ***. Review of the above records demonstrates:  Please see  elsewhere in the note.  ***   ASSESSMENT AND PLAN:  ***   Current medicines are reviewed at length with the patient today.  The patient {ACTIONS; HAS/DOES NOT HAVE:19233} concerns regarding medicines.  The following changes have been made:  {PLAN; NO CHANGE:13088:s}  Labs/ tests ordered today include: *** No orders of the defined types were placed in this encounter.    Disposition:   FU with ***    Signed, Rollene Rotunda, MD  11/03/2016 6:58 PM    [facility]

## 2016-11-04 ENCOUNTER — Ambulatory Visit: Payer: BLUE CROSS/BLUE SHIELD | Admitting: Cardiology

## 2016-11-24 ENCOUNTER — Ambulatory Visit: Payer: BLUE CROSS/BLUE SHIELD | Admitting: Cardiovascular Disease

## 2017-01-10 ENCOUNTER — Ambulatory Visit: Payer: BLUE CROSS/BLUE SHIELD | Admitting: Cardiovascular Disease

## 2017-01-10 ENCOUNTER — Encounter: Payer: Self-pay | Admitting: *Deleted

## 2017-01-10 NOTE — Progress Notes (Deleted)
Cardiology Office Note   Date:  01/10/2017   ID:  Eugine, Bubb [birth year], MRN [id number]  PCP:  Jarold Motto, PA  Cardiologist:   Chilton Si, MD   No chief complaint on file.     History of Present Illness: Ryan Rowe is a 31 y.o. male with hypertension, prediabetes, obesity, asthma, and GERD who presents for ***    Past Medical History:  Diagnosis Date  . GERD (gastroesophageal reflux disease)     Past Surgical History:  Procedure Laterality Date  . FRACTURE SURGERY Right    wrist     Current Outpatient Prescriptions  Medication Sig Dispense Refill  . albuterol (PROVENTIL HFA;VENTOLIN HFA) 108 (90 Base) MCG/ACT inhaler Inhale 2 puffs into the lungs every 4 (four) hours as needed for wheezing or shortness of breath (cough, shortness of breath or wheezing.). 1 Inhaler 6  . metFORMIN (GLUCOPHAGE) 500 MG tablet Take 1 tablet (500 mg total) by mouth daily with breakfast. 180 tablet 0  . omeprazole (PRILOSEC) 10 MG capsule Take 10 mg by mouth daily.    . pantoprazole (PROTONIX) 20 MG tablet Take 1 tablet (20 mg total) by mouth daily. 30 tablet 1  . ranitidine (ZANTAC) 150 MG tablet Take 1 tablet (150 mg total) by mouth 2 (two) times daily. 60 tablet 1   No current facility-administered medications for this visit.     Allergies:   Patient has no known allergies.    Social History:  The patient  reports that he has never smoked. He has never used smokeless tobacco. He reports that he drinks alcohol. He reports that he does not use drugs.   Family History:  The patient's ***family history includes ALS in his maternal grandfather; Cancer in his maternal aunt and maternal aunt; Depression in his mother; Diabetes in his maternal grandfather, mother, and other; Heart disease (age of onset: 51) in his cousin; Hypertension in his father, paternal grandfather, and paternal grandmother.    ROS:  Please see the history of present illness.   Otherwise,  review of systems are positive for {NONE DEFAULTED:18576::"none"}.   All other systems are reviewed and negative.    PHYSICAL EXAM: VS:  There were no vitals taken for this visit. , BMI There is no height or weight on file to calculate BMI. GENERAL:  Well appearing HEENT:  Pupils equal round and reactive, fundi not visualized, oral mucosa unremarkable NECK:  No jugular venous distention, waveform within normal limits, carotid upstroke brisk and symmetric, no bruits, no thyromegaly LYMPHATICS:  No cervical adenopathy LUNGS:  Clear to auscultation bilaterally HEART:  RRR.  PMI not displaced or sustained,S1 and S2 within normal limits, no S3, no S4, no clicks, no rubs, *** murmurs ABD:  Flat, positive bowel sounds normal in frequency in pitch, no bruits, no rebound, no guarding, no midline pulsatile mass, no hepatomegaly, no splenomegaly EXT:  2 plus pulses throughout, no edema, no cyanosis no clubbing SKIN:  No rashes no nodules NEURO:  Cranial nerves II through XII grossly intact, motor grossly intact throughout PSYCH:  Cognitively intact, oriented to person place and time    EKG:  EKG {ACTION; IS/IS WUJ:[id number]} ordered today. The ekg ordered today demonstrates ***   Recent Labs: 09/29/2016: ALT 29; BUN 11; Creatinine, Ser 1.06; Hemoglobin 14.1; Platelets 273.0; Potassium 4.2; Sodium 138 10/10/2016: TSH 0.91    Lipid Panel    Component Value Date/Time   CHOL 170 09/29/2016 1004   TRIG 58.0 09/29/2016 1004  HDL 35.00 (L) 09/29/2016 1004   CHOLHDL 5 09/29/2016 1004   VLDL 11.6 09/29/2016 1004   LDLCALC 123 (H) 09/29/2016 1004      Wt Readings from Last 3 Encounters:  10/10/16 118.4 kg (261 lb)  09/29/16 118.8 kg (262 lb)  05/02/16 116.5 kg (256 lb 12.8 oz)      ASSESSMENT AND PLAN:  ***   Current medicines are reviewed at length with the patient today.  The patient {ACTIONS; HAS/DOES NOT HAVE:19233} concerns regarding medicines.  The following changes have been  made:  {PLAN; NO CHANGE:13088:s}  Labs/ tests ordered today include: *** No orders of the defined types were placed in this encounter.    Disposition:   FU with ***    This note was written with the assistance of speech recognition software.  Please excuse any transcriptional errors.  Signed, Advay Volante C. Duke Salviaandolph, MD, [facility]  01/10/2017 7:57 AM    [facility]

## 2017-05-23 ENCOUNTER — Encounter: Payer: Self-pay | Admitting: Family Medicine

## 2017-05-23 ENCOUNTER — Ambulatory Visit: Payer: BLUE CROSS/BLUE SHIELD | Admitting: Family Medicine

## 2017-05-23 VITALS — BP 120/90 | HR 66 | Ht 70.0 in | Wt 262.0 lb

## 2017-05-23 DIAGNOSIS — R030 Elevated blood-pressure reading, without diagnosis of hypertension: Secondary | ICD-10-CM | POA: Diagnosis not present

## 2017-05-23 DIAGNOSIS — R079 Chest pain, unspecified: Secondary | ICD-10-CM | POA: Diagnosis not present

## 2017-05-23 DIAGNOSIS — H6121 Impacted cerumen, right ear: Secondary | ICD-10-CM

## 2017-05-23 NOTE — Progress Notes (Signed)
    Subjective:  Ryan Rowe is a 31 y.o. male who presents today with a chief complaint of elevated blood pressure reading.   HPI:   Elevated Blood Pressure Reading, chronic problem, worsening Patient has long-standing history of elevated blood pressure reading.  He is not currently on any medications.  Over the last few days he has noticed that his blood pressures have been in the 140s over 80s.  No clear precipitating events, however does note that he has had several dietary indiscretions over the past week including [place] food, [employer], and 2 plates of Thanksgiving dinner.  He is having some mild chest discomfort (see problem below).  No shortness of breath.  No headache.  Chest discomfort, chronic problem Patient with a difficult time articulating his exact sensation.  States that it is located his lower chest and occurs randomly.  He will have a brief moment of chest discomfort that will then resolve.  No clear precipitating event.  Pain occurs randomly.  No chest pain or shortness of breath with exertion.  Not associated with eating.  He does have reflux-though does not think this is causing his symptoms.  Right ear pain, acute issue Patient also with intermittent right ear pain.  He has had to have his ears irrigated due to cerumen impaction in the past.  No fevers or chills.  No treatments tried.  ROS: Per HPI  PMH: Smoking history reviewed.  Never smoker.  Objective:  Physical Exam: BP 120/90 (BP Location: Left Arm, Cuff Size: Large)   Pulse 66   Ht 5\' 10"  (1.778 m)   Wt 262 lb (118.8 kg)   SpO2 98%   BMI 37.59 kg/m   Gen: NAD, resting comfortably HEENT: Right EAC with impacted cerumen.  Left EAC clear.  TMs clear. CV: RRR with no murmurs appreciated Chest: No deformities.  Mildly tender to palpation along left lower sternal border. Pulm: NWOB, CTAB with no crackles, wheezes, or rhonchi GI: Obese.  Normal bowel sounds present. Soft, Nontender,  Nondistended. MSK: No edema, cyanosis, or clubbing noted Skin: Warm, dry Neuro: Grossly normal, moves all extremities Psych: Normal affect and thought content  Cerumen excessively irrigated by CMA.  TM then visualized which was clear.  EKG: Normal sinus rhythm.  No ischemic changes.  Assessment/Plan:  Elevated blood pressure reading, established problem Mildly elevated today.  Likely secondary to recent dietary indiscretions with increased salt intake.  Reassured patient.  Discussed lifestyle modifications including weight loss, regular exercise, and avoidance of high salt foods.  Discussed goal blood pressure of 140/90 or less.  Chest discomfort, established problem Unclear etiology.  He may be describing premature beats/palpitations, however is having a difficult time articulating this.  His EKG today is normal without ischemic changes.  Symptoms are nonexertional-doubt that he has a cardiac etiology.  He does have some mild chest wall tenderness which could be contributing.  He also has a history of GERD, however is treated with Prilosec, and his history is not consistent with this.  Reassured patient.  Advised him to follow-up with cardiology as initially planned as he may need prolonged cardiac monitoring to evaluate for ectopic beats/arrhythmia.  Right ear cerumen impaction, acute problem Successfully irrigated today.  Discussed preventative measures and avoidance of Q-tips.  Katina Degreealeb M. Jimmey RalphParker, MD 05/23/2017 3:47 PM

## 2017-09-15 DIAGNOSIS — R0789 Other chest pain: Secondary | ICD-10-CM | POA: Diagnosis not present

## 2017-09-15 DIAGNOSIS — R030 Elevated blood-pressure reading, without diagnosis of hypertension: Secondary | ICD-10-CM | POA: Diagnosis not present

## 2017-09-18 ENCOUNTER — Ambulatory Visit: Payer: BLUE CROSS/BLUE SHIELD | Admitting: Physician Assistant

## 2017-09-18 ENCOUNTER — Encounter: Payer: Self-pay | Admitting: *Deleted

## 2017-09-18 ENCOUNTER — Encounter: Payer: Self-pay | Admitting: Physician Assistant

## 2017-09-18 ENCOUNTER — Other Ambulatory Visit: Payer: Self-pay | Admitting: Physician Assistant

## 2017-09-18 VITALS — BP 146/98 | HR 76 | Temp 98.3°F | Ht 70.0 in | Wt 257.0 lb

## 2017-09-18 DIAGNOSIS — R519 Headache, unspecified: Secondary | ICD-10-CM

## 2017-09-18 DIAGNOSIS — R51 Headache: Secondary | ICD-10-CM | POA: Diagnosis not present

## 2017-09-18 DIAGNOSIS — R7303 Prediabetes: Secondary | ICD-10-CM | POA: Diagnosis not present

## 2017-09-18 DIAGNOSIS — R030 Elevated blood-pressure reading, without diagnosis of hypertension: Secondary | ICD-10-CM

## 2017-09-18 LAB — CBC
HCT: 42.4 % (ref 39.0–52.0)
Hemoglobin: 13.4 g/dL (ref 13.0–17.0)
MCHC: 31.7 g/dL (ref 30.0–36.0)
MCV: 70.5 fl — ABNORMAL LOW (ref 78.0–100.0)
Platelets: 272 10*3/uL (ref 150.0–400.0)
RBC: 6.01 Mil/uL — ABNORMAL HIGH (ref 4.22–5.81)
RDW: 14.9 % (ref 11.5–15.5)
WBC: 4.2 10*3/uL (ref 4.0–10.5)

## 2017-09-18 LAB — BASIC METABOLIC PANEL
BUN: 12 mg/dL (ref 6–23)
CO2: 31 mEq/L (ref 19–32)
Calcium: 9.3 mg/dL (ref 8.4–10.5)
Chloride: 104 mEq/L (ref 96–112)
Creatinine, Ser: 0.99 mg/dL (ref 0.40–1.50)
GFR: 112.53 mL/min (ref >60.00)
Glucose, Bld: 110 mg/dL — ABNORMAL HIGH (ref 70–99)
Potassium: 4.1 mEq/L (ref 3.5–5.1)
Sodium: 139 mEq/L (ref 135–145)

## 2017-09-18 LAB — HEMOGLOBIN A1C: Hgb A1c MFr Bld: 6.2 % (ref 4.6–6.5)

## 2017-09-18 MED ORDER — LISINOPRIL 10 MG PO TABS: 10.0000 mg | ORAL_TABLET | Freq: Every day | ORAL | 1 refills | Status: DC

## 2017-09-18 MED ORDER — METFORMIN HCL 500 MG PO TABS: 500.0000 mg | ORAL_TABLET | Freq: Every day | ORAL | 0 refills | Status: DC

## 2017-09-18 NOTE — Progress Notes (Signed)
Ryan Rowe is a 32 y.o. male here for a new problem.  I acted as a Neurosurgeonscribe for Energy East CorporationSamantha Keyon Liller, PA-C Corky Mullonna Orphanos, LPN   History of Present Illness:   Chief Complaint  Patient presents with  . Headache    Headache   This is a new problem. The current episode started in the past 7 days ([facility] and blood pressure 150/90). The problem occurs daily. The problem has been unchanged. The pain is located in the frontal region. The pain radiates to the left arm (back of skull). The pain quality is not similar to prior headaches. The quality of the pain is described as aching. The pain is at a severity of 8/10. The pain is moderate. Associated symptoms include dizziness, photophobia, sinus pressure and a visual change. Pertinent negatives include no blurred vision, loss of balance, nausea, scalp tenderness, tingling, tinnitus or vomiting. Associated symptoms comments: Left hand shakey off and on. Nothing aggravates the symptoms. He has tried acetaminophen and darkened room for the symptoms. The treatment provided no relief. His past medical history is significant for hypertension and migraines in the family (Mother).   Trying to work on better diet and being more active. Despite that, he admits that he had [employer] last night and HA worsened today as a result of it. He went to Urgent Care over the weekend for his return of L arm pain and chest pain that he has been dealing with for quite some time (see . They did an EKG and told him it was normal. BP was elevated when he was there, per his report. He has been on Lisinopril in the past and tolerated well. He was also diagnosed with "pre-diabetes" by myself with HgbA1c of 6.3 in April 2018, he has not been compliant with taking his metformin.  Lab Results  Component Value Date   HGBA1C 6.3 09/29/2016     Wt Readings from Last 5 Encounters:  09/18/17 257 lb (116.6 kg)  05/23/17 262 lb (118.8 kg)  10/10/16 261 lb (118.4 kg)   09/29/16 262 lb (118.8 kg)  05/02/16 256 lb 12.8 oz (116.5 kg)     Past Medical History:  Diagnosis Date  . GERD (gastroesophageal reflux disease)      Social History   Socioeconomic History  . Marital status: Single    Spouse name: Not on file  . Number of children: Not on file  . Years of education: Not on file  . Highest education level: Not on file  Occupational History  . Not on file  Social Needs  . Financial resource strain: Not on file  . Food insecurity:    Worry: Not on file    Inability: Not on file  . Transportation needs:    Medical: Not on file    Non-medical: Not on file  Tobacco Use  . Smoking status: Never Smoker  . Smokeless tobacco: Never Used  Substance and Sexual Activity  . Alcohol use: Yes    Comment: Socially  . Drug use: No  . Sexual activity: Yes  Lifestyle  . Physical activity:    Days per week: Not on file    Minutes per session: Not on file  . Stress: Not on file  Relationships  . Social connections:    Talks on phone: Not on file    Gets together: Not on file    Attends religious service: Not on file    Active member of club or organization:  Not on file    Attends meetings of clubs or organizations: Not on file    Relationship status: Not on file  . Intimate partner violence:    Fear of current or ex partner: Not on file    Emotionally abused: Not on file    Physically abused: Not on file    Forced sexual activity: Not on file  Other Topics Concern  . 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  Exercise - regularly. Diet - healthy.       Past Surgical History:  Procedure Laterality Date  . FRACTURE SURGERY Right    wrist    Family History  Problem Relation Age of Onset  . Depression Mother   . Diabetes Mother        controlled w/o medication  . Hypertension Father   . Diabetes Other   . Cancer Maternal Aunt        breast  . ALS Maternal  Grandfather   . Diabetes Maternal Grandfather   . Cancer Maternal Aunt        breast  . Heart disease Cousin 50  . Hypertension Paternal Grandmother   . Hypertension Paternal Grandfather   . Stroke Neg Hx     No Known Allergies  Current Medications:   Current Outpatient Medications:  .  albuterol (PROVENTIL HFA;VENTOLIN HFA) 108 (90 Base) MCG/ACT inhaler, Inhale 2 puffs into the lungs every 4 (four) hours as needed for wheezing or shortness of breath (cough, shortness of breath or wheezing.)., Disp: 1 Inhaler, Rfl: 6 .  omeprazole (PRILOSEC) 10 MG capsule, Take 10 mg by mouth daily., Disp: , Rfl:  .  lisinopril (PRINIVIL,ZESTRIL) 10 MG tablet, Take 1 tablet (10 mg total) by mouth daily., Disp: 30 tablet, Rfl: 1 .  metFORMIN (GLUCOPHAGE) 500 MG tablet, Take 1 tablet (500 mg total) by mouth daily with breakfast. (Patient not taking: Reported on 09/18/2017), Disp: 180 tablet, Rfl: 0   Review of Systems:   Review of Systems  HENT: Positive for sinus pressure. Negative for tinnitus.   Eyes: Positive for photophobia. Negative for blurred vision.  Gastrointestinal: Negative for nausea and vomiting.  Neurological: Positive for dizziness and headaches. Negative for tingling and loss of balance.    Vitals:   Vitals:   09/18/17 1346 09/18/17 1404  BP: (!) 146/100 (!) 146/98  Pulse: 76   Temp: 98.3 F (36.8 C)   TempSrc: Oral   SpO2: 97%   Weight: 257 lb (116.6 kg)   Height: 5\' 10"  (1.778 m)      Body mass index is 36.88 kg/m.  Physical Exam:   Physical Exam  Constitutional: He appears well-developed. He is cooperative.  Non-toxic appearance. He does not have a sickly appearance. He does not appear ill. No distress.  Cardiovascular: Normal rate, regular rhythm, S1 normal, S2 normal, normal heart sounds and normal pulses.  No LE edema  Pulmonary/Chest: Effort normal and breath sounds normal.  Neurological: He is alert. He has normal strength. No cranial nerve deficit or sensory  deficit. Coordination and gait normal. GCS eye subscore is 4. GCS verbal subscore is 5. GCS motor subscore is 6.  Skin: Skin is warm, dry and intact.  Psychiatric: He has a normal mood and affect. His speech is normal and behavior is normal.  Nursing note and vitals reviewed.   Assessment and Plan:    Ryan Rowe was seen today for headache.  Diagnoses and all orders for this visit:  Elevated blood pressure reading  This is a long-standing issue. He has been on Lisinopril in the past, will resume at 10 mg today. Continue to keep BP log for [place] to review at next visit, in 2 weeks. Work on limiting high sodium foods. I really do think he would benefit from a cardiology evaluation for his long-standing chest and L arm discomfort, so I have put that referral back in for him. He is agreeable to going. -     CBC -     Basic metabolic panel -     Ambulatory referral to Cardiology  Persistent headaches Neuro exam benign. Will check labs today, and update HgbA1c. Suspect HA from recent dietary indiscretion and lack of BP control. Reviewed stroke symptoms and recommended he seek medical attention should he develop any of those. -     CBC -     Basic metabolic panel -     Ambulatory referral to Cardiology  Pre-diabetes Re-check today. He is off metformin, consider resuming. -     Hemoglobin A1c  Other orders -     lisinopril (PRINIVIL,ZESTRIL) 10 MG tablet; Take 1 tablet (10 mg total) by mouth daily.    . Reviewed expectations re: course of current medical issues. . Discussed self-management of symptoms. . Outlined signs and symptoms indicating need for more acute intervention. . Patient verbalized understanding and all questions were answered. . See orders for this visit as documented in the electronic medical record. . Patient received an After-Visit Summary.  CMA or LPN served as scribe during this visit. History, Physical, and Plan performed by medical provider. Documentation and orders  reviewed and attested to.  Jarold Motto, PA-C

## 2017-09-18 NOTE — Patient Instructions (Addendum)
It was great to see you!  Start the Lisinopril 10 mg.  Follow-up with us in 2 weeks to review your blood pressure.  If you develop any concerning symptoms, such as unusual chest pain, severe sudden headaches, or worsening vision, please seek medical attention.

## 2017-10-02 ENCOUNTER — Ambulatory Visit: Payer: BLUE CROSS/BLUE SHIELD | Admitting: Physician Assistant

## 2017-10-02 DIAGNOSIS — Z0289 Encounter for other administrative examinations: Secondary | ICD-10-CM

## 2017-10-05 ENCOUNTER — Ambulatory Visit: Payer: BLUE CROSS/BLUE SHIELD | Admitting: Physician Assistant

## 2017-10-10 ENCOUNTER — Ambulatory Visit: Payer: BLUE CROSS/BLUE SHIELD | Admitting: Physician Assistant

## 2017-11-15 ENCOUNTER — Ambulatory Visit: Payer: BLUE CROSS/BLUE SHIELD | Admitting: Physician Assistant

## 2017-11-16 ENCOUNTER — Encounter: Payer: Self-pay | Admitting: Physician Assistant

## 2017-12-19 ENCOUNTER — Other Ambulatory Visit: Payer: Self-pay | Admitting: Physician Assistant

## 2017-12-23 ENCOUNTER — Other Ambulatory Visit: Payer: Self-pay | Admitting: Physician Assistant

## 2017-12-26 ENCOUNTER — Emergency Department (HOSPITAL_COMMUNITY)
Admission: EM | Admit: 2017-12-26 | Discharge: 2017-12-26 | Disposition: A | Discharge status: Home / Self Care | Payer: BLUE CROSS/BLUE SHIELD | Attending: Emergency Medicine | Admitting: Emergency Medicine

## 2017-12-26 ENCOUNTER — Emergency Department (HOSPITAL_COMMUNITY): Payer: BLUE CROSS/BLUE SHIELD

## 2017-12-26 ENCOUNTER — Encounter (HOSPITAL_COMMUNITY): Payer: Self-pay | Admitting: Emergency Medicine

## 2017-12-26 ENCOUNTER — Encounter: Payer: Self-pay | Admitting: Physician Assistant

## 2017-12-26 ENCOUNTER — Ambulatory Visit: Payer: BLUE CROSS/BLUE SHIELD | Admitting: Physician Assistant

## 2017-12-26 VITALS — BP 134/90 | HR 66 | Temp 98.3°F | Ht 70.0 in | Wt 249.2 lb

## 2017-12-26 DIAGNOSIS — Z79899 Other long term (current) drug therapy: Secondary | ICD-10-CM | POA: Diagnosis not present

## 2017-12-26 DIAGNOSIS — R079 Chest pain, unspecified: Secondary | ICD-10-CM

## 2017-12-26 DIAGNOSIS — J4520 Mild intermittent asthma, uncomplicated: Secondary | ICD-10-CM | POA: Diagnosis not present

## 2017-12-26 DIAGNOSIS — R0602 Shortness of breath: Secondary | ICD-10-CM | POA: Diagnosis not present

## 2017-12-26 DIAGNOSIS — Z7984 Long term (current) use of oral hypoglycemic drugs: Secondary | ICD-10-CM | POA: Insufficient documentation

## 2017-12-26 DIAGNOSIS — I10 Essential (primary) hypertension: Secondary | ICD-10-CM | POA: Insufficient documentation

## 2017-12-26 DIAGNOSIS — E119 Type 2 diabetes mellitus without complications: Secondary | ICD-10-CM | POA: Diagnosis not present

## 2017-12-26 DIAGNOSIS — R51 Headache: Secondary | ICD-10-CM | POA: Diagnosis not present

## 2017-12-26 LAB — COMPREHENSIVE METABOLIC PANEL
ALT: 22 U/L (ref 0–44)
AST: 22 U/L (ref 15–41)
Albumin: 4.4 g/dL (ref 3.5–5.0)
Alkaline Phosphatase: 51 U/L (ref 38–126)
Anion gap: 11 (ref 5–15)
BUN: 13 mg/dL (ref 6–20)
CO2: 25 mmol/L (ref 22–32)
Calcium: 9.6 mg/dL (ref 8.9–10.3)
Chloride: 101 mmol/L (ref 98–111)
Creatinine, Ser: 1.12 mg/dL (ref 0.61–1.24)
GFR calc Af Amer: >60 mL/min (ref >60)
GFR calc non Af Amer: >60 mL/min (ref >60)
Glucose, Bld: 100 mg/dL — ABNORMAL HIGH (ref 70–99)
Potassium: 3.4 mmol/L — ABNORMAL LOW (ref 3.5–5.1)
Sodium: 137 mmol/L (ref 135–145)
Total Bilirubin: 1.3 mg/dL — ABNORMAL HIGH (ref 0.3–1.2)
Total Protein: 8 g/dL (ref 6.5–8.1)

## 2017-12-26 LAB — CBC
HCT: 45.5 % (ref 39.0–52.0)
Hemoglobin: 14.9 g/dL (ref 13.0–17.0)
MCH: 22.6 pg — ABNORMAL LOW (ref 26.0–34.0)
MCHC: 32.7 g/dL (ref 30.0–36.0)
MCV: 69.1 fL — ABNORMAL LOW (ref 78.0–100.0)
Platelets: 277 10*3/uL (ref 150–400)
RBC: 6.58 MIL/uL — ABNORMAL HIGH (ref 4.22–5.81)
RDW: 14.6 % (ref 11.5–15.5)
WBC: 4.8 10*3/uL (ref 4.0–10.5)

## 2017-12-26 LAB — TROPONIN I
Troponin I: <0.03 ng/mL (ref <0.03)
Troponin I: <0.03 ng/mL (ref <0.03)

## 2017-12-26 LAB — LIPASE, BLOOD: Lipase: 23 U/L (ref 11–51)

## 2017-12-26 MED ORDER — ASPIRIN 81 MG PO CHEW
324.0000 mg | CHEWABLE_TABLET | Freq: Once | ORAL | Status: AC
  Administered 2017-12-26: 324 mg via ORAL
  Filled 2017-12-26: qty 4

## 2017-12-26 NOTE — ED Triage Notes (Signed)
[facility] Reports that he started having chest pain after playing basketball yesterday. Hypertensive. States that he just started back taking meds yesterday.

## 2017-12-26 NOTE — Progress Notes (Signed)
Ryan Rowe is a 32 y.o. male here for a follow up of a pre-existing problem.  I acted as a Neurosurgeonscribe for [employer] CorporationSamantha Tyrah Broers, PA-C Corky Mullonna Orphanos, LPN  History of Present Illness:   Chief Complaint  Patient presents with  . Hypertension    Hypertension  This is a chronic problem. Episode onset: Pt has not been feeling good x 1 week, saw Nurse Practitioner at work yesterday. The problem is uncontrolled. Associated symptoms include chest pain (last night), headaches, palpitations (Last night) and shortness of breath. Pertinent negatives include no peripheral edema. (Pt was playing basketball last night and was having some chest pains and dizziness.) Risk factors for coronary artery disease include obesity and male gender. The current treatment provides no improvement. Compliance problems: Pt only started Lisinopril on Saturday and was started on HCTZ 12.5 mg yesterday.  Pre-diabetes.   He is currently having chest pain. Was having chest pain last night while playing basketball and had SOB. Has been having dizziness, HA.   He was recently without insurance so he has not been taking his blood pressure medication as he was supposed to. He did not start his Lisinopril 10 mg until Saturday, last seen by me in March.   Past Medical History:  Diagnosis Date  . GERD (gastroesophageal reflux disease)      Social History   Socioeconomic History  . Marital status: Single    Spouse name: Not on file  . Number of children: Not on file  . Years of education: Not on file  . Highest education level: Not on file  Occupational History  . Not on file  Social Needs  . Financial resource strain: Not on file  . Food insecurity:    Worry: Not on file    Inability: Not on file  . Transportation needs:    Medical: Not on file    Non-medical: Not on file  Tobacco Use  . Smoking status: Never Smoker  . Smokeless tobacco: Never Used  Substance and Sexual Activity  . Alcohol use: Yes    Comment:  Socially  . Drug use: No  . Sexual activity: Yes  Lifestyle  . Physical activity:    Days per week: Not on file    Minutes per session: Not on file  . Stress: Not on file  Relationships  . Social connections:    Talks on phone: Not on file    Gets together: Not on file    Attends religious service: Not on file    Active member of club or organization: Not on file    Attends meetings of clubs or organizations: Not on file    Relationship status: Not on file  . Intimate partner violence:    Fear of current or ex partner: Not on file    Emotionally abused: Not on file    Physically abused: Not on file    Forced sexual activity: Not on file  Other Topics Concern  . 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  Single, no children.  Exercise - regularly. Diet - healthy.       Past Surgical History:  Procedure Laterality Date  . FRACTURE SURGERY Right    wrist    Family History  Problem Relation Age of Onset  . Depression Mother   . Diabetes Mother        controlled w/o medication  . Hypertension Father   . Diabetes Other   .  Cancer Maternal Aunt        breast  . ALS Maternal Grandfather   . Diabetes Maternal Grandfather   . Cancer Maternal Aunt        breast  . Heart disease Cousin 50  . Hypertension Paternal Grandmother   . Hypertension Paternal Grandfather   . Stroke Neg Hx     No Known Allergies  Current Medications:   Current Outpatient Medications:  .  albuterol (PROVENTIL HFA;VENTOLIN HFA) 108 (90 Base) MCG/ACT inhaler, Inhale 2 puffs into the lungs every 4 (four) hours as needed for wheezing or shortness of breath (cough, shortness of breath or wheezing.)., Disp: 1 Inhaler, Rfl: 6 .  hydrochlorothiazide (HYDRODIURIL) 12.5 MG tablet, , Disp: , Rfl: 0 .  lisinopril (PRINIVIL,ZESTRIL) 10 MG tablet, TAKE 1 TABLET DAILY BY MOUTH, Disp: 30 tablet, Rfl: 0 .  metFORMIN (GLUCOPHAGE) 500 MG tablet, Take 1 tablet  (500 mg total) by mouth daily with breakfast., Disp: 90 tablet, Rfl: 0 .  omeprazole (PRILOSEC) 10 MG capsule, Take 10 mg by mouth daily., Disp: , Rfl:    Review of Systems:   Review of Systems  Respiratory: Positive for shortness of breath.   Cardiovascular: Positive for chest pain (last night) and palpitations (Last night).  Neurological: Positive for headaches.    Vitals:   Vitals:   12/26/17 0940  BP: 134/90  Pulse: 66  Temp: 98.3 F (36.8 C)  TempSrc: Oral  SpO2: 96%  Weight: 249 lb 4 oz (113.1 kg)  Height: 5\' 10"  (1.778 m)     Body mass index is 35.76 kg/m.  Physical Exam:   Physical Exam  Constitutional: He is oriented to person, place, and time. He appears well-developed and well-nourished.  HENT:  Head: Normocephalic.  Eyes: Pupils are equal, round, and reactive to light.  Neck: Normal range of motion. Neck supple.  Cardiovascular: Normal rate and regular rhythm.  Pulmonary/Chest: Effort normal.  Musculoskeletal: Normal range of motion.  Neurological: He is alert and oriented to person, place, and time.  Skin: Skin is warm and dry.  Psychiatric: He has a normal mood and affect. His behavior is normal. Judgment and thought content normal.  Nursing note and vitals reviewed.   EKG: EKG tracing is personally reviewed.  EKG notes NSR.  Possible ST changes.  Assessment and Plan:    Yisrael was seen today for hypertension.  Diagnoses and all orders for this visit:  Chest pain, unspecified type; SOB (shortness of breath); Essential hypertension Needs ER evaluation as he is having ongoing chest pain at this time. EKG reviewed, possible ST changes. High medical decision making -- reviewed with Dr. Helane Rima, who is in agreement. Offered EMS however patient declined. -     EKG 12-Lead   . Reviewed expectations re: course of current medical issues. . Discussed self-management of symptoms. . Outlined signs and symptoms indicating need for more acute  intervention. . Patient verbalized understanding and all questions were answered. . See orders for this visit as documented in the electronic medical record. . Patient received an After-Visit Summary.  CMA or LPN served as scribe during this visit. History, Physical, and Plan performed by medical provider. Documentation and orders reviewed and attested to.   Jarold Motto, PA-C

## 2017-12-26 NOTE — ED Notes (Signed)
ED Provider at bedside. 

## 2017-12-26 NOTE — ED Provider Notes (Signed)
[facility]-EMERGENCY DEPT Provider Note   CSN: [id number] Arrival date & time: 12/26/17  1016     History   Chief Complaint Chief Complaint  Patient presents with  . Chest Pain  . Headache    HPI Ryan Rowe is a 32 y.o. male.  32 year old male presents with 1993-month history of intermittent substernal chest discomfort.  Pain waxes and wanes at times lasts for hours and sometimes last for several weeks.  Sometimes it is associated with eating.  Most recently he has had substernal chest pressure that got worse with playing sports which was yesterday.  Symptoms got better with rest and he does have a history of hypertension as well as prediabetes.  No fever, cough, congestion.  Saw his primary care doctor today in the office visit was reviewed and patient had an EKG that had possible ST segment elevation and patient was sent here for further management.  No treatment was used prior to arrival.     Past Medical History:  Diagnosis Date  . Diabetes mellitus without complication (HCC)   . GERD (gastroesophageal reflux disease)   . Hypertension     Patient Active Problem List   Diagnosis Date Noted  . Mild intermittent asthma without complication 09/29/2016  . Pre-diabetes 09/29/2016  . History of hypertension 09/29/2016  . Obesity (BMI 35.0-39.9 without comorbidity) 09/29/2016  . GERD (gastroesophageal reflux disease) 02/21/2015    Past Surgical History:  Procedure Laterality Date  . FRACTURE SURGERY Right    wrist        Home Medications    Prior to Admission medications   Medication Sig Start Date End Date Taking? Authorizing Provider  albuterol (PROVENTIL HFA;VENTOLIN HFA) 108 (90 Base) MCG/ACT inhaler Inhale 2 puffs into the lungs every 4 (four) hours as needed for wheezing or shortness of breath (cough, shortness of breath or wheezing.). 05/07/16  Yes Elvina SidleLauenstein, Kurt, MD  Fexofenadine HCl [employer](ALLEGRA ALLERGY PO) Take 1 tablet by mouth daily  as needed (allergies).   Yes [provider]  hydrochlorothiazide (HYDRODIURIL) 12.5 MG tablet  12/25/17  Yes [provider]  lisinopril (PRINIVIL,ZESTRIL) 10 MG tablet TAKE 1 TABLET DAILY BY MOUTH 12/25/17  Yes Jarold MottoWorley, Samantha, PA  metFORMIN (GLUCOPHAGE) 500 MG tablet Take 1 tablet (500 mg total) by mouth daily with breakfast. 09/18/17  Yes Jarold MottoWorley, Samantha, PA  omeprazole (PRILOSEC) 10 MG capsule Take 10 mg by mouth daily.   Yes [provider]    Family History Family History  Problem Relation Age of Onset  . Depression Mother   . Diabetes Mother        controlled w/o medication  . Hypertension Father   . Diabetes Other   . Cancer Maternal Aunt        breast  . ALS Maternal Grandfather   . Diabetes Maternal Grandfather   . Cancer Maternal Aunt        breast  . Heart disease Cousin 50  . Hypertension Paternal Grandmother   . Hypertension Paternal Grandfather   . Stroke Neg Hx     Social History Social History   Tobacco Use  . Smoking status: Never Smoker  . Smokeless tobacco: Never Used  Substance Use Topics  . Alcohol use: Yes    Comment: Socially  . Drug use: No     Allergies   Patient has no known allergies.   Review of Systems Review of Systems  All other systems reviewed and are negative.    Physical  Exam Updated Vital Signs BP (!) 172/92 (BP Location: Left Arm)   Pulse 85   Temp 97.9 F (36.6 C) (Oral)   Resp 16   SpO2 100%   Physical Exam  Constitutional: He is oriented to person, place, and time. He appears well-developed and well-nourished.  Non-toxic appearance. No distress.  HENT:  Head: Normocephalic and atraumatic.  Eyes: Pupils are equal, round, and reactive to light. Conjunctivae, EOM and lids are normal.  Neck: Normal range of motion. Neck supple. No tracheal deviation present. No thyroid mass present.  Cardiovascular: Normal rate, regular rhythm and normal heart sounds. Exam reveals no gallop.  No murmur  heard. Pulmonary/Chest: Effort normal and breath sounds normal. No stridor. No respiratory distress. He has no decreased breath sounds. He has no wheezes. He has no rhonchi. He has no rales.    Abdominal: Soft. Normal appearance and bowel sounds are normal. He exhibits no distension. There is no tenderness. There is no rebound and no CVA tenderness.  Musculoskeletal: Normal range of motion. He exhibits no edema or tenderness.  Neurological: He is alert and oriented to person, place, and time. He has normal strength. No cranial nerve deficit or sensory deficit. GCS eye subscore is 4. GCS verbal subscore is 5. GCS motor subscore is 6.  Skin: Skin is warm and dry. No abrasion and no rash noted.  Psychiatric: He has a normal mood and affect. His speech is normal and behavior is normal.  Nursing note and vitals reviewed.    ED Treatments / Results  Labs (all labs ordered are listed, but only abnormal results are displayed) Labs Reviewed  CBC  COMPREHENSIVE METABOLIC PANEL  LIPASE, BLOOD    EKG EKG Interpretation  Date/Time:  Tuesday December 26 2017 10:22:05 EDT Ventricular Rate:  88 PR Interval:    QRS Duration: 90 QT Interval:  338 QTC Calculation: 409 R Axis:   58 Text Interpretation:  Sinus rhythm Borderline T wave abnormalities Baseline wander in lead(s) III Confirmed by Lorre Nick ([id number]) on 12/26/2017 10:31:33 AM   Radiology No results found.  Procedures Procedures (including critical care time)  Medications Ordered in ED Medications  aspirin chewable tablet 324 mg (has no administration in time range)     Initial Impression / Assessment and Plan / ED Course  I have reviewed the triage vital signs and the nursing notes.  Pertinent labs & imaging results that were available during my care of the patient were reviewed by me and considered in my medical decision making (see chart for details).     Patient has a heart score of 3.  Will perform delta troponins.   Patient states that his symptoms have been worse with food at times as well as worse with exertion however it is not caused him to not do his activity.  His EKG does not show any signs of acute infarction but questionable T wave changes.  Given aspirin here.   2:58 PM Patient's repeat troponin was negative.  Low risk for ACS.  Will still give cardiology referral, patient recently started his antihypertensives 48 hours ago and was instructed to continue this and I would not make any changes to his regimen.  I will also informed to take an aspirin a day.  Final Clinical Impressions(s) / ED Diagnoses   Final diagnoses:  None    ED Discharge Orders    None       Lorre Nick, MD 12/26/17 1458

## 2017-12-26 NOTE — Discharge Instructions (Signed)
Continue taking your blood pressure medication as directed.  Take an aspirin a day as directed.

## 2017-12-26 NOTE — Patient Instructions (Signed)
GO TO THE ER NOW !!!!!

## 2017-12-26 NOTE — ED Notes (Signed)
Patient transported to X-ray 

## 2017-12-27 ENCOUNTER — Encounter: Payer: Self-pay | Admitting: Physician Assistant

## 2017-12-27 ENCOUNTER — Ambulatory Visit: Payer: BLUE CROSS/BLUE SHIELD | Admitting: Physician Assistant

## 2017-12-27 VITALS — BP 146/100 | HR 77 | Temp 98.6°F | Ht 70.0 in | Wt 246.2 lb

## 2017-12-27 DIAGNOSIS — R7303 Prediabetes: Secondary | ICD-10-CM | POA: Diagnosis not present

## 2017-12-27 DIAGNOSIS — K2100 Gastro-esophageal reflux disease with esophagitis, without bleeding: Secondary | ICD-10-CM

## 2017-12-27 DIAGNOSIS — I10 Essential (primary) hypertension: Secondary | ICD-10-CM | POA: Diagnosis not present

## 2017-12-27 DIAGNOSIS — H6123 Impacted cerumen, bilateral: Secondary | ICD-10-CM

## 2017-12-27 MED ORDER — RANITIDINE HCL 150 MG PO TABS: 150.0000 mg | ORAL_TABLET | Freq: Two times a day (BID) | ORAL | 1 refills | Status: DC

## 2017-12-27 MED ORDER — LISINOPRIL 20 MG PO TABS: 20.0000 mg | ORAL_TABLET | Freq: Every day | ORAL | 1 refills | Status: DC

## 2017-12-27 NOTE — Assessment & Plan Note (Signed)
Re-check HgbA1c. Continue Metformin 500mg , will adjust based on HgbA1c if warranted. Continue to work on weight loss.

## 2017-12-27 NOTE — Patient Instructions (Addendum)
It was great to see you!  Stop the hydrochlorothiazide.  We are going to increase Lisinopril to 20 mg. Also take Ranitidine 150 mg twice a day. Continue the 81 mg low dose aspirin for now -- after we check your cholesterol I will let you know if you should continue this.  Please make an appointment with the lab on your way out. I would like for you to return for lab work within 1 week. After midnight on the day of the lab draw, please do not eat anything. You may have water, black coffee, unsweetened tea.  If any worsening chest pain or shortness of breath, please seek medical attention.

## 2017-12-27 NOTE — Assessment & Plan Note (Addendum)
Uncontrolled. Discontinue HCTZ 12.5 mg. Increase Lisinopril to 20 mg. Labs. Follow-up in 2-4 weeks for repeat blood pressure check.

## 2017-12-27 NOTE — Assessment & Plan Note (Signed)
Bilateral ear lavage performed today. Patient tolerated well.

## 2017-12-27 NOTE — Assessment & Plan Note (Addendum)
Uncontrolled. Will try Ranitidine 150 mg BID. Follow-up in 2-4 weeks for follow-up.

## 2017-12-27 NOTE — Progress Notes (Signed)
Ryan Rowe is a 32 y.o. male is here for follow up ED visit yesterday.  I acted as a Neurosurgeonscribe for [employer]Samantha Evon Dejarnett, PA-C Corky Mullonna Orphanos, LPN  History of Present Illness:   Chief Complaint  Patient presents with  . Follow up ED visit 7/2    HPI   Patient went to ER yesterday after visit with us -- troponins were negative. Work-up was low for ACS, suspected patient's chest pain was likely related to GERD. CMP was slightly low, at 3.4. Chest xray yesterday was normal. He was told to follow-up with us and to start a daily ASA. He is currently taking low dose ASA.  BP Readings from Last 3 Encounters:  12/27/17 (!) 146/100  12/26/17 (!) 155/90  12/26/17 134/90   HTN Currently taking Lisinopril 10 mg and HCTZ 12.5 mg. Patient denies further chest pain, SOB, blurred vision, dizziness, unusual headaches, lower leg swelling. Patient is compliant with medication. Denies excessive caffeine intake, stimulant usage, excessive alcohol intake, or increase in salt consumption.  Cerumen Impaction Occasionally needs to get his ears cleaned out. States that he feels like he needs to at this time. Denies fever, recent URI symptoms.  Pre-Diabetes Currently taking Metformin 500 mg. He is currently working on weight loss. Plans to continue to limit sugary beverages, was drinking several juices and sodas daily.  Lab Results  Component Value Date   HGBA1C 6.2 09/18/2017     Wt Readings from Last 5 Encounters:  12/27/17 246 lb 4 oz (111.7 kg)  12/26/17 249 lb 4 oz (113.1 kg)  09/18/17 257 lb (116.6 kg)  05/23/17 262 lb (118.8 kg)  10/10/16 261 lb (118.4 kg)     There are no preventive care reminders to display for this patient.  Past Medical History:  Diagnosis Date  . Diabetes mellitus without complication (HCC)   . GERD (gastroesophageal reflux disease)   . Hypertension      Social History   Socioeconomic History  . Marital status: Single    Spouse name: Not on file  . Number  of children: Not on file  . Years of education: Not on file  . Highest education level: Not on file  Occupational History  . Not on file  Social Needs  . Financial resource strain: Not on file  . Food insecurity:    Worry: Not on file    Inability: Not on file  . Transportation needs:    Medical: Not on file    Non-medical: Not on file  Tobacco Use  . Smoking status: Never Smoker  . Smokeless tobacco: Never Used  Substance and Sexual Activity  . Alcohol use: Yes    Comment: Socially  . Drug use: No  . Sexual activity: Yes  Lifestyle  . Physical activity:    Days per week: Not on file    Minutes per session: Not on file  . Stress: Not on file  Relationships  . Social connections:    Talks on phone: Not on file    Gets together: Not on file    Attends religious service: Not on file    Active member of club or organization: Not on file    Attends meetings of clubs or organizations: Not on file    Relationship status: Not on file  . Intimate partner violence:    Fear of current or ex partner: Not on file    Emotionally abused: Not on file    Physically abused: Not on file  Forced sexual activity: Not on file  Other Topics Concern  . 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  Exercise - regularly. Diet - healthy.       Past Surgical History:  Procedure Laterality Date  . FRACTURE SURGERY Right    wrist    Family History  Problem Relation Age of Onset  . Depression Mother   . Diabetes Mother        controlled w/o medication  . Hypertension Father   . Diabetes Other   . Cancer Maternal Aunt        breast  . ALS Maternal Grandfather   . Diabetes Maternal Grandfather   . Cancer Maternal Aunt        breast  . Heart disease Cousin 50  . Hypertension Paternal Grandmother   . Hypertension Paternal Grandfather   . Stroke Neg Hx     PMHx, SurgHx, SocialHx, FamHx, Medications, and  Allergies were reviewed in the Visit Navigator and updated as appropriate.   Patient Active Problem List   Diagnosis Date Noted  . Mild intermittent asthma without complication 09/29/2016  . Pre-diabetes 09/29/2016  . History of hypertension 09/29/2016  . Obesity (BMI 35.0-39.9 without comorbidity) 09/29/2016  . GERD (gastroesophageal reflux disease) 02/21/2015    Social History   Tobacco Use  . Smoking status: Never Smoker  . Smokeless tobacco: Never Used  Substance Use Topics  . Alcohol use: Yes    Comment: Socially  . Drug use: No    Current Medications and Allergies:    Current Outpatient Medications:  .  albuterol (PROVENTIL HFA;VENTOLIN HFA) 108 (90 Base) MCG/ACT inhaler, Inhale 2 puffs into the lungs every 4 (four) hours as needed for wheezing or shortness of breath (cough, shortness of breath or wheezing.)., Disp: 1 Inhaler, Rfl: 6 .  Fexofenadine HCl (ALLEGRA ALLERGY PO), Take 1 tablet by mouth daily as needed (allergies)., Disp: , Rfl:  .  metFORMIN (GLUCOPHAGE) 500 MG tablet, Take 1 tablet (500 mg total) by mouth daily with breakfast., Disp: 90 tablet, Rfl: 0 .  lisinopril (PRINIVIL,ZESTRIL) 20 MG tablet, Take 1 tablet (20 mg total) by mouth daily., Disp: 30 tablet, Rfl: 1 .  ranitidine (ZANTAC) 150 MG tablet, Take 1 tablet (150 mg total) by mouth 2 (two) times daily., Disp: 60 tablet, Rfl: 1  No Known Allergies  Review of Systems   ROS  Negative unless otherwise specified per HPI.  Vitals:   Vitals:   12/27/17 0816  BP: (!) 146/100  Pulse: 77  Temp: 98.6 F (37 C)  TempSrc: Oral  SpO2: 96%  Weight: 246 lb 4 oz (111.7 kg)  Height: 5\' 10"  (1.778 m)     Body mass index is 35.33 kg/m.   Physical Exam:    Physical Exam  Constitutional: He appears well-developed. He is cooperative.  Non-toxic appearance. He does not have a sickly appearance. He does not appear ill. No distress.  HENT:  Right Ear: External ear normal.  Left Ear: External ear normal.    Unable to visualize bilateral TM's 2/2 cerumen  Cardiovascular: Normal rate, regular rhythm, S1 normal, S2 normal, normal heart sounds and normal pulses.  No LE edema  Pulmonary/Chest: Effort normal and breath sounds normal.  Neurological: He is alert. GCS eye subscore is 4. GCS verbal subscore is 5. GCS motor subscore is 6.  Skin: Skin is warm, dry and intact.  Psychiatric: He has a  normal mood and affect. His speech is normal and behavior is normal.  Nursing note and vitals reviewed.  Procedure: Cerumen Disimpaction Warm water applied and gentle ear lavage performed on the bilateral ears. There were no complications and following the disimpaction the tympanic membrane was visible on the bilateral. Tympanic membranes are intact following the procedure.  Auditory canals are normal.  The patient reported relief of symptoms after removal of cerumen.    Assessment and Plan:    Problem List Items Addressed This Visit      Cardiovascular and Mediastinum   Essential hypertension - Primary    Uncontrolled. Discontinue HCTZ 12.5 mg. Increase Lisinopril to 20 mg. Labs. Follow-up in 2-4 weeks for repeat blood pressure check.      Relevant Medications   lisinopril (PRINIVIL,ZESTRIL) 20 MG tablet   Other Relevant Orders   Lipid panel   Basic metabolic panel     Digestive   GERD (gastroesophageal reflux disease)    Uncontrolled. Will try Ranitidine 150 mg BID. Follow-up in 2-4 weeks for follow-up.      Relevant Medications   ranitidine (ZANTAC) 150 MG tablet     Nervous and Auditory   Bilateral impacted cerumen    Bilateral ear lavage performed today. Patient tolerated well.      Relevant Orders   Ear wax removal     Other   Pre-diabetes    Re-check HgbA1c. Continue Metformin 500mg , will adjust based on HgbA1c if warranted. Continue to work on weight loss.      Relevant Orders   Hemoglobin A1c       . Reviewed expectations re: course of current medical issues. . Discussed  self-management of symptoms. . Outlined signs and symptoms indicating need for more acute intervention. . Patient verbalized understanding and all questions were answered. . See orders for this visit as documented in the electronic medical record. . Patient received an After Visit Summary.  CMA or LPN served as scribe during this visit. History, Physical, and Plan performed by medical provider. Documentation and orders reviewed and attested to.   Jarold Motto, PA-C [place], [place] 12/27/2017  Follow-up: No follow-ups on file.

## 2017-12-29 ENCOUNTER — Ambulatory Visit: Payer: BLUE CROSS/BLUE SHIELD | Admitting: Physician Assistant

## 2017-12-29 ENCOUNTER — Ambulatory Visit (INDEPENDENT_AMBULATORY_CARE_PROVIDER_SITE_OTHER): Payer: BLUE CROSS/BLUE SHIELD | Admitting: Physician Assistant

## 2017-12-29 ENCOUNTER — Other Ambulatory Visit (INDEPENDENT_AMBULATORY_CARE_PROVIDER_SITE_OTHER): Payer: BLUE CROSS/BLUE SHIELD

## 2017-12-29 ENCOUNTER — Encounter: Payer: Self-pay | Admitting: Physician Assistant

## 2017-12-29 VITALS — BP 122/78 | HR 78 | Temp 98.4°F | Ht 70.0 in | Wt 247.4 lb

## 2017-12-29 DIAGNOSIS — I10 Essential (primary) hypertension: Secondary | ICD-10-CM

## 2017-12-29 DIAGNOSIS — R7303 Prediabetes: Secondary | ICD-10-CM

## 2017-12-29 DIAGNOSIS — R519 Headache, unspecified: Secondary | ICD-10-CM

## 2017-12-29 DIAGNOSIS — R51 Headache: Secondary | ICD-10-CM

## 2017-12-29 LAB — BASIC METABOLIC PANEL
BUN: 13 mg/dL (ref 6–23)
CO2: 28 mEq/L (ref 19–32)
Calcium: 10 mg/dL (ref 8.4–10.5)
Chloride: 96 mEq/L (ref 96–112)
Creatinine, Ser: 1.14 mg/dL (ref 0.40–1.50)
GFR: 95.46 mL/min (ref >60.00)
Glucose, Bld: 130 mg/dL — ABNORMAL HIGH (ref 70–99)
Potassium: 3.8 mEq/L (ref 3.5–5.1)
Sodium: 133 mEq/L — ABNORMAL LOW (ref 135–145)

## 2017-12-29 LAB — LIPID PANEL
Cholesterol: 185 mg/dL (ref 0–200)
HDL: 36.9 mg/dL — ABNORMAL LOW (ref >39.00)
LDL Cholesterol: 137 mg/dL — ABNORMAL HIGH (ref 0–99)
NonHDL: 148.33
Total CHOL/HDL Ratio: 5
Triglycerides: 59 mg/dL (ref 0.0–149.0)
VLDL: 11.8 mg/dL (ref 0.0–40.0)

## 2017-12-29 LAB — HEMOGLOBIN A1C: Hgb A1c MFr Bld: 6.2 % (ref 4.6–6.5)

## 2017-12-29 MED ORDER — BUTALBITAL-APAP-CAFFEINE 50-300-40 MG PO CAPS: 1.0000 | ORAL_CAPSULE | Freq: Four times a day (QID) | ORAL | 1 refills | Status: DC

## 2017-12-29 NOTE — Progress Notes (Signed)
Ryan Rowe is a 32 y.o. male here for a follow up of a pre-existing problem.  I acted as a Neurosurgeon for [employer], PA-C Ryan Mull, LPN  History of Present Illness:   Chief Complaint  Patient presents with  . Headache    Headache   This is a recurrent problem. Episode onset: Started 4-5 days ago. The problem occurs daily. The problem has been waxing and waning. The pain is located in the temporal and occipital region. Radiates to: radiates into jaw area. The pain quality is not similar to prior headaches. The quality of the pain is described as squeezing and dull. The pain is at a severity of 8/10. The pain is severe. Associated symptoms include eye pain and a loss of balance (feels off balance when walking). Pertinent negatives include no abdominal pain, blurred vision, dizziness, drainage, ear pain, eye redness, eye watering, facial sweating, nausea, neck pain, numbness, photophobia, scalp tenderness, sinus pressure, sore throat, swollen glands, tinnitus, visual change or vomiting. Nothing aggravates the symptoms. He has tried acetaminophen and cold packs for the symptoms. The treatment provided no relief. His past medical history is significant for hypertension and obesity. There is no history of migraine headaches.   BP Readings from Last 3 Encounters:  12/29/17 122/78  12/27/17 (!) 146/100  12/26/17 (!) 155/90     Past Medical History:  Diagnosis Date  . Diabetes mellitus without complication (HCC)   . GERD (gastroesophageal reflux disease)   . Hypertension      Social History   Socioeconomic History  . Marital status: Single    Spouse name: Not on file  . Number of children: Not on file  . Years of education: Not on file  . Highest education level: Not on file  Occupational History  . Not on file  Social Needs  . Financial resource strain: Not on file  . Food insecurity:    Worry: Not on file    Inability: Not on file  . Transportation needs:   Medical: Not on file    Non-medical: Not on file  Tobacco Use  . Smoking status: Never Smoker  . Smokeless tobacco: Never Used  Substance and Sexual Activity  . Alcohol use: Yes    Comment: Socially  . Drug use: No  . Sexual activity: Yes  Lifestyle  . Physical activity:    Days per week: Not on file    Minutes per session: Not on file  . Stress: Not on file  Relationships  . Social connections:    Talks on phone: Not on file    Gets together: Not on file    Attends religious service: Not on file    Active member of club or organization: Not on file    Attends meetings of clubs or organizations: Not on file    Relationship status: Not on file  . Intimate partner violence:    Fear of current or ex partner: Not on file    Emotionally abused: Not on file    Physically abused: Not on file    Forced sexual activity: Not on file  Other Topics Concern  . 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  Exercise - regularly. Diet - healthy.       Past Surgical History:  Procedure Laterality Date  . FRACTURE SURGERY Right    wrist    Family History  Problem Relation Age of  Onset  . Depression Mother   . Diabetes Mother        controlled w/o medication  . Hypertension Father   . Diabetes Other   . Cancer Maternal Aunt        breast  . ALS Maternal Grandfather   . Diabetes Maternal Grandfather   . Cancer Maternal Aunt        breast  . Heart disease Cousin 50  . Hypertension Paternal Grandmother   . Hypertension Paternal Grandfather   . Stroke Neg Hx     No Known Allergies  Current Medications:   Current Outpatient Medications:  .  albuterol (PROVENTIL HFA;VENTOLIN HFA) 108 (90 Base) MCG/ACT inhaler, Inhale 2 puffs into the lungs every 4 (four) hours as needed for wheezing or shortness of breath (cough, shortness of breath or wheezing.)., Disp: 1 Inhaler, Rfl: 6 .  Fexofenadine HCl (ALLEGRA  ALLERGY PO), Take 1 tablet by mouth daily as needed (allergies)., Disp: , Rfl:  .  lisinopril (PRINIVIL,ZESTRIL) 20 MG tablet, Take 1 tablet (20 mg total) by mouth daily., Disp: 30 tablet, Rfl: 1 .  metFORMIN (GLUCOPHAGE) 500 MG tablet, Take 1 tablet (500 mg total) by mouth daily with breakfast., Disp: 90 tablet, Rfl: 0 .  ranitidine (ZANTAC) 150 MG tablet, Take 1 tablet (150 mg total) by mouth 2 (two) times daily., Disp: 60 tablet, Rfl: 1 .  Butalbital-APAP-Caffeine 50-300-40 MG CAPS, Take 1 capsule by mouth every 6 (six) hours., Disp: 30 capsule, Rfl: 1   Review of Systems:   Review of Systems  HENT: Negative for ear pain, sinus pressure, sore throat and tinnitus.   Eyes: Positive for pain. Negative for blurred vision, photophobia and redness.  Gastrointestinal: Negative for abdominal pain, nausea and vomiting.  Musculoskeletal: Negative for neck pain.  Neurological: Positive for headaches and loss of balance (feels off balance when walking). Negative for dizziness and numbness.    Vitals:   Vitals:   12/29/17 1107  BP: 122/78  Pulse: 78  Temp: 98.4 F (36.9 C)  TempSrc: Oral  SpO2: 98%  Weight: 247 lb 6.1 oz (112.2 kg)  Height: 5\' 10"  (1.778 m)     Body mass index is 35.5 kg/m.  Physical Exam:   Physical Exam  Constitutional: He appears well-developed. He is cooperative.  Non-toxic appearance. He does not have a sickly appearance. He does not appear ill. No distress.  Cardiovascular: Normal rate, regular rhythm, S1 normal, S2 normal, normal heart sounds and normal pulses.  No LE edema  Pulmonary/Chest: Effort normal and breath sounds normal.  Neurological: He is alert. He has normal strength. No cranial nerve deficit or sensory deficit. He displays a negative Romberg sign. Coordination and gait normal. GCS eye subscore is 4. GCS verbal subscore is 5. GCS motor subscore is 6.  Skin: Skin is warm, dry and intact.  Psychiatric: He has a normal mood and affect. His speech is  normal and behavior is normal.  Nursing note and vitals reviewed.   Assessment and Plan:    Ryan Rowe was seen today for headache.  Diagnoses and all orders for this visit:  Persistent headaches  Essential hypertension  Other orders -     Butalbital-APAP-Caffeine 50-300-40 MG CAPS; Take 1 capsule by mouth every 6 (six) hours.   No red flags on exam. Symptoms may be result of lisinopril increase, however I'm very reluctant to change medication as his BP is the best its ever been when we checked it today. Will check labs and electrolytes.  Encouraged hydration and rest. May try Fioricet for HA. I asked patient to call us on Monday and update us on his symptoms -- if they persist or have worsened, will pursue MRI. If any stroke-like symptoms develop, patient was instructed to go to the ER. May also consider adding beta blocker if symptoms persist.  . Reviewed expectations re: course of current medical issues. . Discussed self-management of symptoms. . Outlined signs and symptoms indicating need for more acute intervention. . Patient verbalized understanding and all questions were answered. . See orders for this visit as documented in the electronic medical record. . Patient received an After-Visit Summary.  CMA or LPN served as scribe during this visit. History, Physical, and Plan performed by medical provider. Documentation and orders reviewed and attested to.   Jarold MottoSamantha Atleigh Gruen, PA-C

## 2017-12-29 NOTE — Patient Instructions (Signed)
Call us on Monday and let us know how you are doing.  If any worsening symptoms over the weekend or stroke-like symptoms go to the ER.

## 2018-01-04 ENCOUNTER — Ambulatory Visit: Payer: BLUE CROSS/BLUE SHIELD | Admitting: Physician Assistant

## 2018-01-30 ENCOUNTER — Encounter: Payer: Self-pay | Admitting: Physician Assistant

## 2018-01-30 ENCOUNTER — Ambulatory Visit: Payer: BLUE CROSS/BLUE SHIELD | Admitting: Physician Assistant

## 2018-01-30 VITALS — BP 136/78 | HR 84 | Ht 70.0 in | Wt 247.0 lb

## 2018-01-30 DIAGNOSIS — R51 Headache: Secondary | ICD-10-CM | POA: Diagnosis not present

## 2018-01-30 DIAGNOSIS — R0789 Other chest pain: Secondary | ICD-10-CM | POA: Diagnosis not present

## 2018-01-30 DIAGNOSIS — E785 Hyperlipidemia, unspecified: Secondary | ICD-10-CM

## 2018-01-30 DIAGNOSIS — I10 Essential (primary) hypertension: Secondary | ICD-10-CM

## 2018-01-30 DIAGNOSIS — R519 Headache, unspecified: Secondary | ICD-10-CM

## 2018-01-30 DIAGNOSIS — R7303 Prediabetes: Secondary | ICD-10-CM

## 2018-01-30 NOTE — Progress Notes (Signed)
Cardiology Office Note    Date:  01/30/2018   ID:  Heron, Pitcock 1985/11/03, MRN [id number]  PCP:  Jarold Motto, PA  Cardiologist:  New- Case discussed with DOD Dr. Jens Som   Chief Complaint  Patient presents with  . New Patient (Initial Visit)    recent ED visit with chest pain, labile BP. Discussed with Dr .Jens Som    History of Present Illness:  Ryan Rowe is a 32 y.o. male with past medical history of GERD, hypertension and prediabetes who presented for new patient visit.  Patient is referred by Dr. Lorre Nick of the emergency room.  He recently presented to the ED on 12/26/2017 with intermittent chest discomfort.  He was seen by his primary care doctor however due to concern of possible ST segment elevation that was seen on the EKG, he was eventually sent to the ED for further evaluation.  Serial troponin was negative x2.  Lipid panel obtained at the time showed LDL of 137, HDL 36.9.  His symptom was felt to be atypical and he was referred to cardiology for further evaluation as outpatient.  Since his ED visit, he has been seen by his primary care provider twice, lisinopril was increased to 20 mg daily.  Patient presents today for cardiology office visit.  His headache has decreased since he got his blood pressure under better control.  His blood pressure is mildly elevated in the office today, however normally his systolic blood pressure runs around in the 110-120s range.  He denies any lower extremity edema, orthopnea or PND.  I do not appreciate significant heart murmur on physical exam either.  I would recommend a plain old treadmill test for his chest discomfort.  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  If the treadmill test is negative, no further work-up is needed.  Likelihood of significant cardiac issue in this young patient is very low.  Since he does not have any obvious heart failure symptom on physical exam, I would not recommend echocardiogram at this  point.  Recent EKG at that was obtained in the emergency room has been reviewed, there was no obvious sign of LVH.  I do agree he need to make sure his blood pressure is under better control.  I did not start him on a statin for his elevated LDL given his young age.  He is trying to lose weight and control his diet.  If his LDL can be controlled through this method, I think it would be better.   Past Medical History:  Diagnosis Date  . Diabetes mellitus without complication (HCC)   . GERD (gastroesophageal reflux disease)   . Hypertension     Past Surgical History:  Procedure Laterality Date  . FRACTURE SURGERY Right    wrist    Current Medications: Outpatient Medications Prior to Visit  Medication Sig Dispense Refill  . albuterol (PROVENTIL HFA;VENTOLIN HFA) 108 (90 Base) MCG/ACT inhaler Inhale 2 puffs into the lungs every 4 (four) hours as needed for wheezing or shortness of breath (cough, shortness of breath or wheezing.). 1 Inhaler 6  . Butalbital-APAP-Caffeine 50-300-40 MG CAPS Take 1 capsule by mouth every 6 (six) hours. 30 capsule 1  . Fexofenadine HCl (ALLEGRA ALLERGY PO) Take 1 tablet by mouth daily as needed (allergies).    Marland Kitchen lisinopril (PRINIVIL,ZESTRIL) 20 MG tablet Take 1 tablet (20 mg total) by mouth daily. 30 tablet 1  . metFORMIN (GLUCOPHAGE) 500 MG tablet Take 1  tablet (500 mg total) by mouth daily with breakfast. 90 tablet 0  . ranitidine (ZANTAC) 150 MG tablet Take 1 tablet (150 mg total) by mouth 2 (two) times daily. 60 tablet 1   No facility-administered medications prior to visit.      Allergies:   Patient has no known allergies.   Social History   Socioeconomic History  . Marital status: Single    Spouse name: Not on file  . Number of children: Not on file  . Years of education: Not on file  . Highest education level: Not on file  Occupational History  . Not on file  Social Needs  . Financial resource strain: Not on file  . Food insecurity:    Worry:  Not on file    Inability: Not on file  . Transportation needs:    Medical: Not on file    Non-medical: Not on file  Tobacco Use  . Smoking status: Never Smoker  . Smokeless tobacco: Never Used  Substance and Sexual Activity  . Alcohol use: Yes    Comment: Socially  . Drug use: No  . Sexual activity: Yes  Lifestyle  . Physical activity:    Days per week: Not on file    Minutes per session: Not on file  . Stress: Not on file  Relationships  . Social connections:    Talks on phone: Not on file    Gets together: Not on file    Attends religious service: Not on file    Active member of club or organization: Not on file    Attends meetings of clubs or organizations: Not on file    Relationship status: Not on file  Other Topics Concern  . 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  Exercise - regularly. Diet - healthy.        Family History:  The patient's family history includes ALS in his maternal grandfather; Cancer in his maternal aunt and maternal aunt; Depression in his mother; Diabetes in his maternal grandfather, mother, and other; Heart disease (age of onset: 63) in his cousin; Hypertension in his father, paternal grandfather, and paternal grandmother.   ROS:   Please see the history of present illness.    ROS All other systems reviewed and are negative.   PHYSICAL EXAM:   VS:  BP 136/78   Pulse 84   Ht 5\' 10"  (1.778 m)   Wt 247 lb (112 kg)   BMI 35.44 kg/m    GEN: Well nourished, well developed, in no acute distress  HEENT: normal  Neck: no JVD, carotid bruits, or masses Cardiac: RRR; no murmurs, rubs, or gallops,no edema  Respiratory:  clear to auscultation bilaterally, normal work of breathing GI: soft, nontender, nondistended, + BS MS: no deformity or atrophy  Skin: warm and dry, no rash Neuro:  Alert and Oriented x 3, Strength and sensation are intact Psych: euthymic mood, full  affect  Wt Readings from Last 3 Encounters:  01/30/18 247 lb (112 kg)  12/29/17 247 lb 6.1 oz (112.2 kg)  12/27/17 246 lb 4 oz (111.7 kg)      Studies/Labs Reviewed:   EKG:  EKG is not ordered today.  Recent EKG obtained in the emergency room has been reviewed, normal sinus rhythm with no significant ST-T wave changes  Recent Labs: 12/26/2017: ALT 22; Hemoglobin 14.9; Platelets 277 12/29/2017: BUN 13; Creatinine, Ser 1.14; Potassium 3.8; Sodium  133   Lipid Panel    Component Value Date/Time   CHOL 185 12/29/2017 1049   TRIG 59.0 12/29/2017 1049   HDL 36.90 (L) 12/29/2017 1049   CHOLHDL 5 12/29/2017 1049   VLDL 11.8 12/29/2017 1049   LDLCALC 137 (H) 12/29/2017 1049    Additional studies/ records that were reviewed today include:   N/A   ASSESSMENT:    1. Atypical chest pain   2. Nonintractable episodic headache, unspecified headache type   3. Essential hypertension   4. Hyperlipidemia, unspecified hyperlipidemia type   5. Prediabetes      PLAN:  In order of problems listed above:  1. Atypical chest pain: His chest pain does not worsen with exertion.  There is 2 times a chest pain, the first type was a spasm-like sensation in the substernal area, the second chest pain occurred when he went to the emergency room with elevated blood pressure.  He has been able to play basketball without any issue.  I recommended a plain old treadmill test, if negative, no further work-up is needed.  Given lack of heart murmur and no obvious sign of heart failure symptom, I did not recommend echocardiogram.  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  2. Hypertension: His blood pressure is actually much better controlled now after his lisinopril has been increased to 20 mg daily.  Based on how his home blood pressure readings, his systolic blood pressure has been ranging in the 116-120s.  Blood pressure was only mildly  elevated in the office.  I recommend to continue on the current medication  3. Hyperlipidemia: His LDL was 137 on 12/29/2017.  Instead of starting a statin medication in this very young patient, I would actually recommend diet and exercise for now.  4. Prediabetes: I think this will also improve with diet and exercise.  5. Episodic headache: The headaches seem to occur more whenever his blood pressure is elevated.  We will focus on blood pressure control at this time.    Medication Adjustments/Labs and Tests Ordered: Current medicines are reviewed at length with the patient today.  Concerns regarding medicines are outlined above.  Medication changes, Labs and Tests ordered today are listed in the Patient Instructions below. Patient Instructions  Medication Instructions: Your physician recommends that you continue on your current medications as directed. Please refer to the Current Medication list given to you today.   Testing/Procedures: Your physician has requested that you have an exercise tolerance test. For further information please visit [email or web address] Please also follow instruction sheet, as given.  Follow-Up: Your physician recommends that you schedule a follow-up appointment as needed if stress test is normal. We will call you with your results.      Ramond DialSigned, Shakur Lembo, GeorgiaPA  01/30/2018 6:43 PM    [facility] [address], [place], [place]  [address] Phone: [phone]; Fax: [phone]

## 2018-01-30 NOTE — Patient Instructions (Signed)
Medication Instructions: Your physician recommends that you continue on your current medications as directed. Please refer to the Current Medication list given to you today.   Testing/Procedures: Your physician has requested that you have an exercise tolerance test. For further information please visit [email or web address] Please also follow instruction sheet, as given.  Follow-Up: Your physician recommends that you schedule a follow-up appointment as needed if stress test is normal. We will call you with your results.

## 2018-02-01 ENCOUNTER — Telehealth (HOSPITAL_COMMUNITY): Payer: Self-pay

## 2018-02-01 NOTE — Telephone Encounter (Signed)
Encounter complete. 

## 2018-02-06 ENCOUNTER — Ambulatory Visit (HOSPITAL_COMMUNITY)
Admission: RE | Admit: 2018-02-06 | Discharge: 2018-02-06 | Disposition: A | Discharge status: Home / Self Care | Payer: BLUE CROSS/BLUE SHIELD | Source: Ambulatory Visit | Attending: Cardiology | Admitting: Cardiology

## 2018-02-06 DIAGNOSIS — R0789 Other chest pain: Secondary | ICD-10-CM

## 2018-02-06 LAB — EXERCISE TOLERANCE TEST
Estimated workload: 13.3 METS
Exercise duration (min): 11 min
Exercise duration (sec): 8 s
MPHR: 188 {beats}/min
Peak HR: 169 {beats}/min
Percent HR: 89 %
RPE: 19
Rest HR: 84 {beats}/min

## 2018-03-07 ENCOUNTER — Other Ambulatory Visit: Payer: Self-pay | Admitting: Physician Assistant

## 2018-03-07 NOTE — Telephone Encounter (Signed)
Please Advise if ok for refill. Thank you

## 2018-03-22 ENCOUNTER — Other Ambulatory Visit: Payer: Self-pay

## 2018-03-22 ENCOUNTER — Encounter (HOSPITAL_COMMUNITY): Payer: Self-pay | Admitting: Emergency Medicine

## 2018-03-22 ENCOUNTER — Emergency Department (HOSPITAL_COMMUNITY): Payer: BLUE CROSS/BLUE SHIELD

## 2018-03-22 DIAGNOSIS — R0789 Other chest pain: Secondary | ICD-10-CM | POA: Insufficient documentation

## 2018-03-22 DIAGNOSIS — E119 Type 2 diabetes mellitus without complications: Secondary | ICD-10-CM | POA: Insufficient documentation

## 2018-03-22 DIAGNOSIS — Z7984 Long term (current) use of oral hypoglycemic drugs: Secondary | ICD-10-CM | POA: Diagnosis not present

## 2018-03-22 DIAGNOSIS — I10 Essential (primary) hypertension: Secondary | ICD-10-CM | POA: Insufficient documentation

## 2018-03-22 DIAGNOSIS — R079 Chest pain, unspecified: Secondary | ICD-10-CM | POA: Diagnosis not present

## 2018-03-22 LAB — CBC
HCT: 41.4 % (ref 39.0–52.0)
Hemoglobin: 13.5 g/dL (ref 13.0–17.0)
MCH: 22.7 pg — ABNORMAL LOW (ref 26.0–34.0)
MCHC: 32.6 g/dL (ref 30.0–36.0)
MCV: 69.7 fL — ABNORMAL LOW (ref 78.0–100.0)
Platelets: 287 10*3/uL (ref 150–400)
RBC: 5.94 MIL/uL — ABNORMAL HIGH (ref 4.22–5.81)
RDW: 14.6 % (ref 11.5–15.5)
WBC: 5.7 10*3/uL (ref 4.0–10.5)

## 2018-03-22 LAB — BASIC METABOLIC PANEL
Anion gap: 12 (ref 5–15)
BUN: 18 mg/dL (ref 6–20)
CO2: 22 mmol/L (ref 22–32)
Calcium: 9.1 mg/dL (ref 8.9–10.3)
Chloride: 104 mmol/L (ref 98–111)
Creatinine, Ser: 1.25 mg/dL — ABNORMAL HIGH (ref 0.61–1.24)
GFR calc Af Amer: >60 mL/min (ref >60)
GFR calc non Af Amer: >60 mL/min (ref >60)
Glucose, Bld: 155 mg/dL — ABNORMAL HIGH (ref 70–99)
Potassium: 3.2 mmol/L — ABNORMAL LOW (ref 3.5–5.1)
Sodium: 138 mmol/L (ref 135–145)

## 2018-03-22 LAB — POCT I-STAT TROPONIN I: Troponin i, poc: 0.01 ng/mL (ref 0.00–0.08)

## 2018-03-22 NOTE — ED Triage Notes (Addendum)
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 Denies pain radiation, nausea. Reports bilateral leg weakness and aching.

## 2018-03-23 ENCOUNTER — Emergency Department (HOSPITAL_COMMUNITY)
Admission: EM | Admit: 2018-03-23 | Discharge: 2018-03-23 | Disposition: A | Discharge status: Home / Self Care | Payer: BLUE CROSS/BLUE SHIELD | Attending: Emergency Medicine | Admitting: Emergency Medicine

## 2018-03-23 DIAGNOSIS — R079 Chest pain, unspecified: Secondary | ICD-10-CM

## 2018-03-23 LAB — POCT I-STAT TROPONIN I: Troponin i, poc: 0 ng/mL (ref 0.00–0.08)

## 2018-03-23 LAB — D-DIMER, QUANTITATIVE: D-Dimer, Quant: <0.27 ug/mL-FEU (ref 0.00–0.50)

## 2018-03-23 MED ORDER — POTASSIUM CHLORIDE CRYS ER 20 MEQ PO TBCR
40.0000 meq | EXTENDED_RELEASE_TABLET | Freq: Once | ORAL | Status: AC
  Administered 2018-03-23: 40 meq via ORAL
  Filled 2018-03-23: qty 2

## 2018-03-23 NOTE — ED Provider Notes (Signed)
[facility]-EMERGENCY DEPT Provider Note   CSN: [id number] Arrival date & time: 03/22/18  2112     History   Chief Complaint Chief Complaint  Patient presents with  . Chest Pain    HPI Ryan Rowe is a 32 y.o. male.  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  Patient states that he had some discomfort in his left arm as well as shaking and aching of his bilateral lower extremities.  Denies taking any medications for symptoms.  States that his chest pain felt different than prior chest pain as he is usually used to pain under his left pectoral muscle.  Was seen outpatient by cardiology for evaluation of ongoing chest pain.  Has also had a negative, reassuring stress test in the past month.  He has been compliant with his daily lisinopril.  The history is provided by the patient. No language interpreter was used.  Chest Pain      Past Medical History:  Diagnosis Date  . Diabetes mellitus without complication (HCC)   . GERD (gastroesophageal reflux disease)   . Hypertension     Patient Active Problem List   Diagnosis Date Noted  . Essential hypertension 12/27/2017  . Bilateral impacted cerumen 12/27/2017  . Mild intermittent asthma without complication 09/29/2016  . Pre-diabetes 09/29/2016  . Obesity (BMI 35.0-39.9 without comorbidity) 09/29/2016  . GERD (gastroesophageal reflux disease) 02/21/2015    Past Surgical History:  Procedure Laterality Date  . FRACTURE SURGERY Right    wrist        Home Medications    Prior to Admission medications   Medication Sig Start Date End Date Taking? Authorizing Provider  albuterol (PROVENTIL HFA;VENTOLIN HFA) 108 (90 Base) MCG/ACT inhaler Inhale 2 puffs into the lungs every 4 (four) hours as needed for wheezing or shortness of  breath (cough, shortness of breath or wheezing.). 05/07/16   Elvina Sidle, MD  Butalbital-APAP-Caffeine 50-300-40 MG CAPS Take 1 capsule by mouth every 6 (six) hours. 12/29/17   Jarold Motto, PA  Fexofenadine HCl (ALLEGRA ALLERGY PO) Take 1 tablet by mouth daily as needed (allergies).    [provider]  lisinopril (PRINIVIL,ZESTRIL) 20 MG tablet TAKE 1 TABLET BY MOUTH DAILY 03/07/18   Jarold Motto, PA  metFORMIN (GLUCOPHAGE) 500 MG tablet Take 1 tablet (500 mg total) by mouth daily with breakfast. 09/18/17   Jarold Motto, PA  ranitidine (ZANTAC) 150 MG tablet Take 1 tablet (150 mg total) by mouth 2 (two) times daily. 12/27/17   Jarold Motto, PA    Family History Family History  Problem Relation Age of Onset  . Depression Mother   . Diabetes Mother        controlled w/o medication  . Hypertension Father   . Diabetes Other   . Cancer Maternal Aunt        breast  . ALS Maternal Grandfather   . Diabetes Maternal Grandfather   . Cancer Maternal Aunt        breast  . Heart disease Cousin 50  . Hypertension Paternal Grandmother   . Hypertension Paternal Grandfather   . Stroke Neg Hx     Social History Social History   Tobacco Use  . Smoking status: Never Smoker  . Smokeless tobacco: Never Used  Substance Use Topics  . Alcohol use: Yes    Comment: Socially  .  Drug use: No     Allergies   Patient has no known allergies.   Review of Systems Review of Systems  Cardiovascular: Positive for chest pain.  Ten systems reviewed and are negative for acute change, except as noted in the HPI.    Physical Exam Updated Vital Signs BP (!) 142/95   Pulse (!) 58   Temp 98.8 F (37.1 C) (Oral)   Resp 16   Ht 5\' 6"  (1.676 m)   Wt 111.6 kg   SpO2 99%   BMI 39.71 kg/m   Physical Exam  Constitutional: He is oriented to person, place, and time. He appears well-developed and well-nourished. No distress.  Nontoxic appearing and in NAD  HENT:  Head:  Normocephalic and atraumatic.  Eyes: Conjunctivae and EOM are normal. No scleral icterus.  Neck: Normal range of motion.  Cardiovascular: Normal rate, regular rhythm and intact distal pulses.  Pulmonary/Chest: Effort normal. No stridor. No respiratory distress. He has no wheezes.  Respirations even and unlabored. Lungs CTAB.  Musculoskeletal: Normal range of motion.  No edema in BLE  Neurological: He is alert and oriented to person, place, and time. He exhibits normal muscle tone. Coordination normal.  Skin: Skin is warm and dry. No rash noted. He is not diaphoretic. No erythema. No pallor.  Psychiatric: He has a normal mood and affect. His behavior is normal.  Nursing note and vitals reviewed.    ED Treatments / Results  Labs (all labs ordered are listed, but only abnormal results are displayed) Labs Reviewed  BASIC METABOLIC PANEL - Abnormal; Notable for the following components:      Result Value   Potassium 3.2 (*)    Glucose, Bld 155 (*)    Creatinine, Ser 1.25 (*)    All other components within normal limits  CBC - Abnormal; Notable for the following components:   RBC 5.94 (*)    MCV 69.7 (*)    MCH 22.7 (*)    All other components within normal limits  D-DIMER, QUANTITATIVE (NOT AT [facility])  POCT I-STAT TROPONIN I  POCT I-STAT TROPONIN I    EKG EKG Interpretation  Date/Time:  Thursday March 22 2018 21:17:02 EDT Ventricular Rate:  101 PR Interval:    QRS Duration: 88 QT Interval:  326 QTC Calculation: 423 R Axis:   56 Text Interpretation:  Sinus tachycardia Borderline repolarization abnormality Confirmed by Palumbo, April ([id number]) on 03/23/2018 1:03:28 AM   Radiology Dg Chest 2 View  Result Date: 03/22/2018 CLINICAL DATA:  32 year old male with chest pain. EXAM: CHEST - 2 VIEW COMPARISON:  Chest radiograph dated 12/26/2017 FINDINGS: The heart size and mediastinal contours are within normal limits. Both lungs are clear. The visualized skeletal structures are  unremarkable. IMPRESSION: No active cardiopulmonary disease. Electronically Signed   By: Elgie Collard M.D.   On: 03/22/2018 21:40    Procedures Procedures (including critical care time)  Medications Ordered in ED Medications  potassium chloride SA (K-DUR,KLOR-CON) CR tablet 40 mEq (40 mEq Oral Given 03/23/18 0303)     Initial Impression / Assessment and Plan / ED Course  I have reviewed the triage vital signs and the nursing notes.  Pertinent labs & imaging results that were available during my care of the patient were reviewed by me and considered in my medical decision making (see chart for details).     Patient presents to the emergency department for evaluation of chest pain.  Low suspicion for emergent cardiac etiology given reassuring workup today.  EKG is nonischemic and troponin negative x 2.  Patient has a heart score of 2 consistent with low risk of acute coronary event.  Chest x-ray without evidence of mediastinal widening to suggest dissection.  No pneumothorax, pneumonia, pleural effusion.  Pulmonary embolus further considered; however, patient without tachypnea, dyspnea, hypoxia.  D dimer today is negative.  Given reassuring evaluation, I believe the patient is stable for outpatient follow-up regarding his symptoms.  He has history of a reassuring stress test 1 month ago.  Per chart review, the patient has been evaluated numerous times for chest pain in the past.  Question whether there may be an anxiety component to onset of pain.  Return precautions discussed and provided. Patient discharged in stable condition with no unaddressed concerns.   Final Clinical Impressions(s) / ED Diagnoses   Final diagnoses:  Nonspecific chest pain    ED Discharge Orders    None       Antony Madura, PA-C 03/23/18 0344    Palumbo, April, MD 03/23/18 [phone]

## 2018-03-26 ENCOUNTER — Ambulatory Visit: Payer: BLUE CROSS/BLUE SHIELD | Admitting: Physician Assistant

## 2018-03-26 ENCOUNTER — Encounter: Payer: Self-pay | Admitting: Physician Assistant

## 2018-03-26 VITALS — BP 122/88 | HR 74 | Temp 98.4°F | Ht 66.0 in | Wt 239.0 lb

## 2018-03-26 DIAGNOSIS — F418 Other specified anxiety disorders: Secondary | ICD-10-CM | POA: Diagnosis not present

## 2018-03-26 DIAGNOSIS — Z23 Encounter for immunization: Secondary | ICD-10-CM | POA: Diagnosis not present

## 2018-03-26 DIAGNOSIS — R079 Chest pain, unspecified: Secondary | ICD-10-CM

## 2018-03-26 DIAGNOSIS — M79602 Pain in left arm: Secondary | ICD-10-CM | POA: Diagnosis not present

## 2018-03-26 NOTE — Patient Instructions (Signed)
It was great to see you!  1. Please see Dr. Berline Chough, he is the Sports Medicine provider here in our office. You can schedule a visit on your way out and see him as soon as tomorrow.  2. Please monitor your anxiety. We are here for you, let us know if you need anything.  Let's follow-up in 1 month, sooner if you have concerns.  Take care,  Jarold Motto PA-C

## 2018-03-26 NOTE — Progress Notes (Signed)
Ryan Rowe is a 32 y.o. male is here to discuss: ED visit 9/27.  I acted as a Neurosurgeon for [employer], PA-C Corky Mull, LPN  History of Present Illness:   Chief Complaint  Patient presents with  . Follow-up    HPI  Patient went to the ED on 03/23/18 for nonspecific chest pain, work-up was negative. Had a negative exercise stress test on 02/06/18 with cardiology.  He states that two weeks ago a student had a MI on the football field. He had to watch the student received chest compressions and use the AED. It was a bit traumatizing for him. 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 His mother struggles with anxiety and depression. He denies SI/HI.  He also continues to have pain in his L arm daily. Feels intermittent weakness and tightness in L arm. He often lays down with his L arm bent and behind his head for lengthy periods of time while talking to his girlfriend on the phone. Sometimes has pec muscle tightness. Has some L flank-sided sensation of "pulled muscle" when he stretches in the morning -- the pain is instantly gone when he stops stretching and goes back to "normal" position. Has tried Aleve, Tylenol without improvement. He has not done his usual weight lifting activities to see if this is helps with his symptoms -- he has noticed no difference. Saw a chiropractor last week, Dr. Jonny Ruiz, prior to that saw him 2 years ago. He told him that he needs to return weekly x 12 weeks. He told patient that "he has been sleeping wrong" on his arm and its likely causing this pain.   GAD 7 : Generalized Anxiety Score 03/26/2018  Nervous, Anxious, on Edge 2  Control/stop worrying 1  Worry too much - different things 1  Trouble relaxing 0  Restless 0  Easily annoyed or irritable 0  Afraid - awful might happen 2  Total GAD 7 Score 6  Anxiety Difficulty Somewhat difficult    There are no preventive care  reminders to display for this patient.  Past Medical History:  Diagnosis Date  . GERD (gastroesophageal reflux disease)   . Hypertension   . Pre-diabetes      Social History   Socioeconomic History  . Marital status: Single    Spouse name: Not on file  . Number of children: Not on file  . Years of education: Not on file  . Highest education level: Not on file  Occupational History  . Not on file  Social Needs  . Financial resource strain: Not on file  . Food insecurity:    Worry: Not on file    Inability: Not on file  . Transportation needs:    Medical: Not on file    Non-medical: Not on file  Tobacco Use  . Smoking status: Never Smoker  . Smokeless tobacco: Never Used  Substance and Sexual Activity  . Alcohol use: Yes    Comment: Socially  . Drug use: No  . Sexual activity: Yes  Lifestyle  . Physical activity:    Days per week: Not on file    Minutes per session: Not on file  . Stress: Not on file  Relationships  . Social connections:    Talks on phone: Not on file    Gets together: Not on file    Attends religious service: Not on file    Active  member of club or organization: Not on file    Attends meetings of clubs or organizations: Not on file    Relationship status: Not on file  . Intimate partner violence:    Fear of current or ex partner: Not on file    Emotionally abused: Not on file    Physically abused: Not on file    Forced sexual activity: Not on file  Other Topics Concern  . 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  Exercise - regularly. Diet - healthy.       Past Surgical History:  Procedure Laterality Date  . FRACTURE SURGERY Right    wrist    Family History  Problem Relation Age of Onset  . Depression Mother   . Diabetes Mother        controlled w/o medication  . Hypertension Father   . Diabetes Other   . Cancer Maternal Aunt        breast  . ALS  Maternal Grandfather   . Diabetes Maternal Grandfather   . Cancer Maternal Aunt        breast  . Heart disease Cousin 50  . Hypertension Paternal Grandmother   . Hypertension Paternal Grandfather   . Stroke Neg Hx     PMHx, SurgHx, SocialHx, FamHx, Medications, and Allergies were reviewed in the Visit Navigator and updated as appropriate.   Patient Active Problem List   Diagnosis Date Noted  . Essential hypertension 12/27/2017  . Bilateral impacted cerumen 12/27/2017  . Mild intermittent asthma without complication 09/29/2016  . Pre-diabetes 09/29/2016  . Obesity (BMI 35.0-39.9 without comorbidity) 09/29/2016  . GERD (gastroesophageal reflux disease) 02/21/2015    Social History   Tobacco Use  . Smoking status: Never Smoker  . Smokeless tobacco: Never Used  Substance Use Topics  . Alcohol use: Yes    Comment: Socially  . Drug use: No    Current Medications and Allergies:    Current Outpatient Medications:  .  albuterol (PROVENTIL HFA;VENTOLIN HFA) 108 (90 Base) MCG/ACT inhaler, Inhale 2 puffs into the lungs every 4 (four) hours as needed for wheezing or shortness of breath (cough, shortness of breath or wheezing.)., Disp: 1 Inhaler, Rfl: 6 .  Butalbital-APAP-Caffeine 50-300-40 MG CAPS, Take 1 capsule by mouth every 6 (six) hours., Disp: 30 capsule, Rfl: 1 .  Fexofenadine HCl (ALLEGRA ALLERGY PO), Take 1 tablet by mouth daily as needed (allergies)., Disp: , Rfl:  .  lisinopril (PRINIVIL,ZESTRIL) 20 MG tablet, TAKE 1 TABLET BY MOUTH DAILY, Disp: 30 tablet, Rfl: 0 .  metFORMIN (GLUCOPHAGE) 500 MG tablet, Take 1 tablet (500 mg total) by mouth daily with breakfast., Disp: 90 tablet, Rfl: 0 .  ranitidine (ZANTAC) 150 MG tablet, Take 1 tablet (150 mg total) by mouth 2 (two) times daily., Disp: 60 tablet, Rfl: 1  No Known Allergies  Review of Systems   ROS Negative unless otherwise specified per HPI.  Vitals:   Vitals:   03/26/18 1014  BP: 122/88  Pulse: 74  Temp:  98.4 F (36.9 C)  TempSrc: Oral  SpO2: 97%  Weight: 239 lb (108.4 kg)  Height: 5\' 6"  (1.676 m)     Body mass index is 38.58 kg/m.   Physical Exam:    Physical Exam  Constitutional: He appears well-developed. He is cooperative.  Non-toxic appearance. He does not have a sickly appearance. He does not appear ill. No distress.  Cardiovascular:  Normal rate, regular rhythm, S1 normal, S2 normal, normal heart sounds and normal pulses.  No LE edema  Pulmonary/Chest: Effort normal and breath sounds normal.  Musculoskeletal:  No bony tenderness on spine. Normal ROM of neck and bilateral UE. No reproducible tenderness to palpation of bilateral biceps.  Neurological: He is alert. GCS eye subscore is 4. GCS verbal subscore is 5. GCS motor subscore is 6.  Skin: Skin is warm, dry and intact.  Psychiatric: He has a normal mood and affect. His speech is normal and behavior is normal.  Nursing note and vitals reviewed.    Assessment and Plan:    Ova was seen today for follow-up.  Diagnoses and all orders for this visit:  Chest pain, unspecified type; Anxiety about health Vitals normal. Recent stress test normal. Provided reassurance. Suspect anxiety related. We had a long discussion regarding this. 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 We discussed possibly seeing a therapist in the near future if needed to help with talking through some of his health anxiety. Follow-up in 1 month.  Need for prophylactic vaccination and inoculation against influenza -     Flu Vaccine QUAD 36+ mos IM  Left arm pain Referral to Dr. Berline Chough. -     Ambulatory referral to Sports Medicine  . Reviewed expectations re: course of current medical issues. . Discussed self-management of symptoms. . Outlined signs and symptoms indicating need for more acute intervention. . Patient verbalized understanding and all questions were answered. . See orders for this  visit as documented in the electronic medical record. . Patient received an After Visit Summary.  CMA or LPN served as scribe during this visit. History, Physical, and Plan performed by medical provider. The above documentation has been reviewed and is accurate and complete.  Jarold Motto, PA-C [place] [place] 03/26/2018  Follow-up: No follow-ups on file.

## 2018-03-27 ENCOUNTER — Encounter: Payer: Self-pay | Admitting: Sports Medicine

## 2018-03-27 ENCOUNTER — Ambulatory Visit (INDEPENDENT_AMBULATORY_CARE_PROVIDER_SITE_OTHER): Payer: BLUE CROSS/BLUE SHIELD | Admitting: Sports Medicine

## 2018-03-27 ENCOUNTER — Ambulatory Visit (INDEPENDENT_AMBULATORY_CARE_PROVIDER_SITE_OTHER): Payer: BLUE CROSS/BLUE SHIELD

## 2018-03-27 VITALS — BP 138/86 | HR 69 | Ht 66.0 in | Wt 239.0 lb

## 2018-03-27 DIAGNOSIS — M542 Cervicalgia: Secondary | ICD-10-CM

## 2018-03-27 DIAGNOSIS — R4589 Other symptoms and signs involving emotional state: Secondary | ICD-10-CM

## 2018-03-27 DIAGNOSIS — R079 Chest pain, unspecified: Secondary | ICD-10-CM | POA: Diagnosis not present

## 2018-03-27 DIAGNOSIS — M79602 Pain in left arm: Secondary | ICD-10-CM

## 2018-03-27 DIAGNOSIS — G2589 Other specified extrapyramidal and movement disorders: Secondary | ICD-10-CM

## 2018-03-27 DIAGNOSIS — F418 Other specified anxiety disorders: Secondary | ICD-10-CM | POA: Diagnosis not present

## 2018-03-27 DIAGNOSIS — M5412 Radiculopathy, cervical region: Secondary | ICD-10-CM | POA: Diagnosis not present

## 2018-03-27 NOTE — Progress Notes (Signed)
PROCEDURE NOTE: THERAPEUTIC EXERCISES (97110) 15 minutes spent for Therapeutic exercises as below and as referenced in the AVS.  This included exercises focusing on stretching, strengthening, with significant focus on eccentric aspects.   Proper technique shown and discussed handout in great detail with ATC.  All questions were discussed and answered.   Long term goals include an improvement in range of motion, strength, endurance as well as avoiding reinjury. Frequency of visits is one time as determined during today's  office visit. Frequency of exercises to be performed is as per handout.  EXERCISES REVIEWED:  Ryan Rowe Exercises   PEC stretching  Scapular Stabilization

## 2018-03-27 NOTE — Patient Instructions (Addendum)
Also check out [employer]" which is a program developed by Dr. Myles Lipps.   There are links to a couple of his YouTube Videos below and I would like to see performing one of his videos 5-6 days per week.    A good intro video is: "Independence from Pain 7-minute Video" - [email or web address]   Exercises that focus more on the neck are as below: Dr. Derrill Kay with [employer] [facility] teaching neck and shoulder details Part 1 - [email or web address] Part 2 Dr. Derrill Kay with [facility] quick routine to practice daily - https://[email or web address].[email or web address]  Do not try to attempt the entire video when first beginning.    Try breaking of each exercise that he goes into shorter segments.  Otherwise if they perform an exercise for 45 seconds, start with 15 seconds and rest and then resume when they begin the new activity.  If you work your way up to being able to do these videos without having to stop, I expect you will see significant improvements in your pain.  If you enjoy his videos and would like to find out more you can look on his website: [email or web address].  He has a workout streaming option as well as a DVD set available for purchase.  Amazon has the best price for his DVDs.     Please perform the exercise program that we have prepared for you and gone over in detail on a daily basis.  In addition to the handout you were provided you can access your program through: [email or web address]   Your unique program code is: [facility]

## 2018-03-27 NOTE — Progress Notes (Signed)
Ryan Rowe. [name] [id number]  Decker Cogdell - 32 y.o. male MRN [id number]  Date of birth: 1985-08-25  Visit Date: 03/27/2018  PCP: Jarold Motto, PA   Referred by: Jarold Motto, [place]   Scribe(s) for today's visit: Christoper Fabian, LAT, ATC  SUBJECTIVE:  Ryan Rowe is here for New Patient (Initial Visit) (L arm pain)  Referred by: Rinaldo Cloud, PA  HPI: His L arm pain symptoms INITIALLY: Began months ago w/ no known MOI.  The majority of his symptoms are in his ventral arm from the bicep to the forearm flexors.  Pain will radiate into the L shoulder and post scap.  Also c/o L ant and axillary chest pain which he went to the ED for on 03/23/18. Described as mod-severe aching/throbbing/burning pain, radiating to L chest and shoulder Worsened with nothing noted.  He works in Production designer, theatre/television/film and doesn't notice his symptoms w/ work but notices them after. Improved with rest Additional associated symptoms include: slight N/T noted in L hand; no mechanical symptoms noted in L shoulder or elbow and no swelling    At this time symptoms are worsening compared to onset  He has been tried Tylenol and Aleve w/ minimal relief. He has seen a chiropractor for his neck.  Chest XR - 03/22/18  REVIEW OF SYSTEMS: Reports night time disturbances. Denies fevers, chills, or night sweats. Reports unexplained weight loss. Denies personal history of cancer. Denies changes in bowel or bladder habits. Denies recent unreported falls. Denies new or worsening dyspnea or wheezing. Reports headaches or dizziness.   Yes to headaches. Reports numbness, tingling or weakness  In the extremities - in the L UE Denies dizziness or presyncopal episodes Denies lower extremity edema    HISTORY:  Prior history reviewed and updated per electronic medical record.  Social History   Occupational History  . Not on file  Tobacco Use  . Smoking  status: Never Smoker  . Smokeless tobacco: Never Used  Substance and Sexual Activity  . Alcohol use: Yes    Comment: Socially  . Drug use: No  . 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  Single, no children.  Exercise - regularly. Diet - healthy.       Past Medical History:  Diagnosis Date  . GERD (gastroesophageal reflux disease)   . Hypertension   . Pre-diabetes    Past Surgical History:  Procedure Laterality Date  . FRACTURE SURGERY Right    wrist   family history includes ALS in his maternal grandfather; Cancer in his maternal aunt and maternal aunt; Depression in his mother; Diabetes in his maternal grandfather, mother, and another family member; Heart disease (age of onset: 36) in his cousin; Hypertension in his father, paternal grandfather, and paternal grandmother. There is no history of Stroke.  DATA OBTAINED & REVIEWED:   Recent Labs    09/18/17 1406 12/29/17 1049  HGBA1C 6.2 6.2   .   OBJECTIVE:  VS:  HT:5\' 6"  (167.6 cm)   WT:239 lb (108.4 kg)  BMI:38.59    BP:138/86  HR:69bpm  TEMP: ( )  RESP:97 %   PHYSICAL EXAM: CONSTITUTIONAL: Well-developed, Well-nourished and In no acute distress PSYCHIATRIC: Alert & appropriately interactive. and Not depressed or anxious appearing. RESPIRATORY: No increased work of breathing and Trachea Midline EYES: Pupils are equal., EOM intact without nystagmus. and No  scleral icterus.  VASCULAR EXAM: Warm and well perfused NEURO: unremarkable Normal associated myotomal distribution strength to manual muscle testing Normal sensation to light touch Normal and symmetric associated DTRs  MSK Exam: Left shoulder  Well aligned, no significant deformity. No overlying skin changes. No focal bony tenderness   Axial loading produces: No pain and No crepitation Drop arm test: negative  Internal Rotation:  ROM: Normal with no pain.   Strength:  Normal External Rotation:  ROM: Normal with no pain.   Strength: Normal  Hawkins: positive, mild pain Neers: positive, mild pain  Empty Can: normal, no pain Strength: Normal Speed's: normal, no pain Strength: Normal O'Briens: normal, no pain Strength: Normal   ASSESSMENT   1. Left arm pain   2. Chest pain, unspecified type   3. Anxiety about health   4. Scapular dyskinesis   5. Neck pain     PLAN:  Pertinent additional documentation may be included in corresponding procedure notes, imaging studies, problem based documentation and patient instructions.  Procedures:  . Discussed the foundation of treatment for this condition is physical therapy and/or daily (5-6 days/week) therapeutic exercises, focusing on core strengthening, coordination, neuromuscular control/reeducation.  Therapeutic exercises prescribed per procedure note.  Medications:  No orders of the defined types were placed in this encounter.  Discussion/Instructions: No problem-specific Assessment & Plan notes found for this encounter.  . Left arm symptoms are consistent with muscular skeletal pain.  He will benefit from chiropractic therapy and recommend he return to having this performed in addition to the home exercises we reviewed with him today. . Discussed the underlying features of tight hip flexors leading to crouched, fetal like position that results in spinal column compression.  Including lumbar hyperflexion with hypermobility, thoracic flexion with restrictive rotation and cervical lordosis reversal  . Links to [employer] provided today per Patient Instructions.  These exercises were developed by Myles Lipps, DC with a strong emphasis on core neuromuscular reducation and postural realignment through body-weight exercises. . Discussed red flag symptoms that warrant earlier emergent evaluation and patient voices understanding. . Activity modifications and the importance of avoiding  exacerbating activities (limiting pain to no more than a 4 / 10 during or following activity) recommended and discussed.  Follow-up:  . Return in about 6 weeks (around 05/08/2018).   . If any lack of improvement consider: further diagnostic evaluation with MRI of the cervical spine and/or shoulder.  . At follow up will plan to consider: Repeat clinical exam per     CMA/ATC served as scribe during this visit. History, Physical, and Plan performed by medical provider. Documentation and orders reviewed and attested to.      Andrena Mews, DO    [facility] Phys[facility]

## 2018-04-02 ENCOUNTER — Other Ambulatory Visit: Payer: Self-pay | Admitting: Physician Assistant

## 2018-04-04 LAB — CBC AND DIFFERENTIAL
HCT: 46 (ref 41–53)
Hemoglobin: 14 (ref 13.5–17.5)
Neutrophils Absolute: 3
Platelets: 320 (ref 150–399)
WBC: 5.2

## 2018-04-04 LAB — VITAMIN B12: Vitamin B-12: 1136

## 2018-04-04 LAB — VITAMIN D 25 HYDROXY (VIT D DEFICIENCY, FRACTURES): Vit D, 25-Hydroxy: 22.5

## 2018-04-04 LAB — TSH: TSH: 1.53 (ref 0.41–5.90)

## 2018-04-10 ENCOUNTER — Other Ambulatory Visit: Payer: Self-pay | Admitting: Physician Assistant

## 2018-04-16 ENCOUNTER — Ambulatory Visit: Payer: BLUE CROSS/BLUE SHIELD | Admitting: Physician Assistant

## 2018-04-16 ENCOUNTER — Encounter: Payer: Self-pay | Admitting: Physician Assistant

## 2018-04-16 VITALS — BP 118/80 | HR 75 | Temp 98.1°F | Ht 66.0 in | Wt 240.2 lb

## 2018-04-16 DIAGNOSIS — D751 Secondary polycythemia: Secondary | ICD-10-CM

## 2018-04-16 DIAGNOSIS — F418 Other specified anxiety disorders: Secondary | ICD-10-CM | POA: Diagnosis not present

## 2018-04-16 DIAGNOSIS — E559 Vitamin D deficiency, unspecified: Secondary | ICD-10-CM | POA: Insufficient documentation

## 2018-04-16 DIAGNOSIS — R718 Other abnormality of red blood cells: Secondary | ICD-10-CM

## 2018-04-16 MED ORDER — HYDROXYZINE HCL 25 MG PO TABS: 25.0000 mg | ORAL_TABLET | Freq: Every evening | ORAL | 0 refills | Status: DC | PRN

## 2018-04-16 NOTE — Patient Instructions (Signed)
It was great to see you!  You may take Atarax 25 mg (1 tablet) for panic attacks if needed. May increase to 50 mg (2 tablets) if necessary.  We will contact you regarding you labs.  Please find a therapist! We are always here for you if you need anything.  Let's follow-up in 3 months, sooner if you have concerns.  Take care,  Jarold Motto PA-C

## 2018-04-16 NOTE — Progress Notes (Signed)
Ryan Rowe is a 32 y.o. male is here to discuss: Labs and Anxiety.  I acted as a Neurosurgeon for [employer], PA-C Corky Mull, LPN  History of Present Illness:   Chief Complaint  Patient presents with  . Anxiety    Anxiety  Presents for follow-up visit. Symptoms include excessive worry, irritability, muscle tension (Left arm), nervous/anxious behavior, panic (3 since here last, ) and restlessness. Patient reports no confusion, decreased concentration, depressed mood, dizziness, dry mouth, feeling of choking, insomnia, malaise, nausea, palpitations, shortness of breath or suicidal ideas. Symptoms occur occasionally. The most recent episode lasted 2 days. The severity of symptoms is mild and interfering with daily activities. The quality of sleep is good. Nighttime awakenings: none.   Compliance with medications: Pt not taking any medication at present.   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 He is considering therapy.  Elevated RBC One of the nurse practitioners at his work got some routine labs done on him and he is now concerned about his RBC being elevated. He was told that he may need a blood smear or to check his lead levels. He worked in Production designer, theatre/television/film for several years and has concerns that he could have been exposed to lead, but is unsure. He denies: lightheadedness, chest pain, palpitations, SOB.  GAD 7 : Generalized Anxiety Score 04/16/2018 03/26/2018  Nervous, Anxious, on Edge 1 2  Control/stop worrying 1 1  Worry too much - different things 1 1  Trouble relaxing 0 0  Restless 0 0  Easily annoyed or irritable 0 0  Afraid - awful might happen 1 2  Total GAD 7 Score 4 6  Anxiety Difficulty Somewhat difficult Somewhat difficult     There are no preventive care reminders to display for this patient.  Past Medical History:  Diagnosis Date  . GERD  (gastroesophageal reflux disease)   . Hypertension   . Pre-diabetes      Social History   Socioeconomic History  . Marital status: Single    Spouse name: Not on file  . Number of children: Not on file  . Years of education: Not on file  . Highest education level: Not on file  Occupational History  . Not on file  Social Needs  . Financial resource strain: Not on file  . Food insecurity:    Worry: Not on file    Inability: Not on file  . Transportation needs:    Medical: Not on file    Non-medical: Not on file  Tobacco Use  . Smoking status: Never Smoker  . Smokeless tobacco: Never Used  Substance and Sexual Activity  . Alcohol use: Yes    Comment: Socially  . Drug use: No  . Sexual activity: Yes  Lifestyle  . Physical activity:    Days per week: Not on file    Minutes per session: Not on file  . Stress: Not on file  Relationships  . Social connections:    Talks on phone: Not on file    Gets together: Not on file    Attends religious service: Not on file    Active member of club or organization: Not on file    Attends meetings of clubs or organizations: Not on file    Relationship status: Not on file  . Intimate partner violence:    Fear of current or ex partner: Not on file  Emotionally abused: Not on file    Physically abused: Not on file    Forced sexual activity: Not on file  Other Topics Concern  . 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  Exercise - regularly. Diet - healthy.       Past Surgical History:  Procedure Laterality Date  . FRACTURE SURGERY Right    wrist    Family History  Problem Relation Age of Onset  . Depression Mother   . Diabetes Mother        controlled w/o medication  . Hypertension Father   . Diabetes Other   . Cancer Maternal Aunt        breast  . ALS Maternal Grandfather   . Diabetes Maternal Grandfather   . Cancer Maternal Aunt         breast  . Heart disease Cousin 50  . Hypertension Paternal Grandmother   . Hypertension Paternal Grandfather   . Stroke Neg Hx     PMHx, SurgHx, SocialHx, FamHx, Medications, and Allergies were reviewed in the Visit Navigator and updated as appropriate.   Patient Active Problem List   Diagnosis Date Noted  . Vitamin D deficiency 04/16/2018  . Essential hypertension 12/27/2017  . Bilateral impacted cerumen 12/27/2017  . Mild intermittent asthma without complication 09/29/2016  . Pre-diabetes 09/29/2016  . Obesity (BMI 35.0-39.9 without comorbidity) 09/29/2016  . GERD (gastroesophageal reflux disease) 02/21/2015    Social History   Tobacco Use  . Smoking status: Never Smoker  . Smokeless tobacco: Never Used  Substance Use Topics  . Alcohol use: Yes    Comment: Socially  . Drug use: No    Current Medications and Allergies:    Current Outpatient Medications:  .  albuterol (PROVENTIL HFA;VENTOLIN HFA) 108 (90 Base) MCG/ACT inhaler, Inhale 2 puffs into the lungs every 4 (four) hours as needed for wheezing or shortness of breath (cough, shortness of breath or wheezing.)., Disp: 1 Inhaler, Rfl: 6 .  Butalbital-APAP-Caffeine 50-300-40 MG CAPS, Take 1 capsule by mouth every 6 (six) hours., Disp: 30 capsule, Rfl: 1 .  Fexofenadine HCl (ALLEGRA ALLERGY PO), Take 1 tablet by mouth daily as needed (allergies)., Disp: , Rfl:  .  lisinopril (PRINIVIL,ZESTRIL) 20 MG tablet, TAKE 1 TABLET BY MOUTH EVERY DAY, Disp: 30 tablet, Rfl: 0 .  metFORMIN (GLUCOPHAGE) 500 MG tablet, TAKE 1 TABLET BY MOUTH DAILY WITH BREAKFAST, Disp: 90 tablet, Rfl: 0 .  ranitidine (ZANTAC) 150 MG tablet, Take 1 tablet (150 mg total) by mouth 2 (two) times daily., Disp: 60 tablet, Rfl: 1 .  hydrOXYzine (ATARAX/VISTARIL) 25 MG tablet, Take 1 tablet (25 mg total) by mouth at bedtime as needed for anxiety (insomnia). Take one 25 mg tablet as needed for anxiety. May increase to 2 tabs., Disp: 60 tablet, Rfl: 0  No Known  Allergies  Review of Systems   Review of Systems  Constitutional: Positive for irritability.  Respiratory: Negative for shortness of breath.   Cardiovascular: Negative for palpitations.  Gastrointestinal: Negative for nausea.  Neurological: Negative for dizziness.  Psychiatric/Behavioral: Negative for confusion, decreased concentration and suicidal ideas. The patient is nervous/anxious. The patient does not have insomnia.     Vitals:   Vitals:   04/16/18 0810  BP: 118/80  Pulse: 75  Temp: 98.1 F (36.7 C)  TempSrc: Oral  SpO2: 97%  Weight: 240 lb 4 oz (109 kg)  Height: 5\' 6"  (1.676 m)  Body mass index is 38.78 kg/m.   Physical Exam:    Physical Exam  Constitutional: He appears well-developed. He is cooperative.  Non-toxic appearance. He does not have a sickly appearance. He does not appear ill. No distress.  Cardiovascular: Normal rate, regular rhythm, S1 normal, S2 normal, normal heart sounds and normal pulses.  No LE edema  Pulmonary/Chest: Effort normal and breath sounds normal.  Neurological: He is alert. GCS eye subscore is 4. GCS verbal subscore is 5. GCS motor subscore is 6.  Skin: Skin is warm, dry and intact.  Psychiatric: He has a normal mood and affect. His speech is normal and behavior is normal.  Nursing note and vitals reviewed.    Assessment and Plan:    Monico was seen today for anxiety.  Diagnoses and all orders for this visit:  Elevated red blood cell count Asymptomatic and no red flags on exam. Reviewed with Dr. Helane Rima. Looking back on his labs -- he has had a chronic elevated RBC associated with decreased MCV and MCH for a while. TIBC was all normal. He would like further work-up, so we have decided to check the following today: -     Lead, blood (adult age 10 yrs or greater) -     Pathologist smear review -     CBC with Differential/Platelet  Anxiety about health Long discussion about this. He reports that he has had in general,  more good days than bad. We decided to have him try to find a therapist and start as needed medication for his panic attacks -- will try Atarax. Follow-up in 3 months, sooner if needed.  Other orders -     hydrOXYzine (ATARAX/VISTARIL) 25 MG tablet; Take 1 tablet (25 mg total) by mouth at bedtime as needed for anxiety (insomnia). Take one 25 mg tablet as needed for anxiety. May increase to 2 tabs.  . Reviewed expectations re: course of current medical issues. . Discussed self-management of symptoms. . Outlined signs and symptoms indicating need for more acute intervention. . Patient verbalized understanding and all questions were answered. . See orders for this visit as documented in the electronic medical record. . Patient received an After Visit Summary.  CMA or LPN served as scribe during this visit. History, Physical, and Plan performed by medical provider. The above documentation has been reviewed and is accurate and complete.  Jarold Motto, PA-C [place] [place] 04/16/2018  Follow-up: No follow-ups on file.

## 2018-04-17 LAB — CBC WITH DIFFERENTIAL/PLATELET
Basophils Absolute: 49 cells/uL (ref 0–200)
Basophils Relative: 1 %
Eosinophils Absolute: 78 cells/uL (ref 15–500)
Eosinophils Relative: 1.6 %
HCT: 47.2 % (ref 38.5–50.0)
Hemoglobin: 14.5 g/dL (ref 13.2–17.1)
Lymphs Abs: 1749 cells/uL (ref 850–3900)
MCH: 21.9 pg — ABNORMAL LOW (ref 27.0–33.0)
MCHC: 30.7 g/dL — ABNORMAL LOW (ref 32.0–36.0)
MCV: 71.3 fL — ABNORMAL LOW (ref 80.0–100.0)
MPV: 10.4 fL (ref 7.5–12.5)
Monocytes Relative: 7.5 %
Neutro Abs: 2656 cells/uL (ref 1500–7800)
Neutrophils Relative %: 54.2 %
Platelets: 331 10*3/uL (ref 140–400)
RBC: 6.62 10*6/uL — ABNORMAL HIGH (ref 4.20–5.80)
RDW: 14.8 % (ref 11.0–15.0)
Total Lymphocyte: 35.7 %
WBC mixed population: 368 cells/uL (ref 200–950)
WBC: 4.9 10*3/uL (ref 3.8–10.8)

## 2018-04-19 ENCOUNTER — Telehealth: Payer: Self-pay | Admitting: Physician Assistant

## 2018-04-19 LAB — LEAD, BLOOD (ADULT >= 16 YRS): Lead: 1 ug/dL (ref <5)

## 2018-04-19 LAB — PATHOLOGIST SMEAR REVIEW

## 2018-04-19 NOTE — Telephone Encounter (Signed)
See note  Copied from CRM [id number]. Topic: General - Inquiry >> Apr 19, 2018 11:50 AM Gaynelle Adu wrote: Reason for CRM: Patient was returning [place] call in regards to labs.please advise

## 2018-04-19 NOTE — Addendum Note (Signed)
Addended by: Waymon Amato R on: 04/19/2018 02:42 PM   Modules accepted: Orders

## 2018-04-23 ENCOUNTER — Telehealth: Payer: Self-pay | Admitting: Hematology and Oncology

## 2018-04-23 ENCOUNTER — Encounter: Payer: Self-pay | Admitting: Hematology and Oncology

## 2018-04-23 NOTE — Telephone Encounter (Signed)
Received a new hem referral from [employer]. Pt has been cld and scheduled to see Dr. Caron Presume on 11/5 at 9am. Pt aware to arrive 30 minutes early. Letter mailed.

## 2018-04-25 NOTE — Telephone Encounter (Signed)
Pt was given lab results on 04/19/18 by Enrique Sack and verbalized understanding.

## 2018-05-01 ENCOUNTER — Inpatient Hospital Stay: Payer: BLUE CROSS/BLUE SHIELD | Attending: Hematology and Oncology | Admitting: Hematology and Oncology

## 2018-05-01 ENCOUNTER — Encounter: Payer: Self-pay | Admitting: Hematology and Oncology

## 2018-05-01 ENCOUNTER — Telehealth: Payer: Self-pay | Admitting: Hematology and Oncology

## 2018-05-01 ENCOUNTER — Inpatient Hospital Stay: Payer: BLUE CROSS/BLUE SHIELD

## 2018-05-01 DIAGNOSIS — D751 Secondary polycythemia: Secondary | ICD-10-CM

## 2018-05-01 DIAGNOSIS — R718 Other abnormality of red blood cells: Secondary | ICD-10-CM

## 2018-05-01 DIAGNOSIS — I10 Essential (primary) hypertension: Secondary | ICD-10-CM | POA: Diagnosis not present

## 2018-05-01 DIAGNOSIS — R7303 Prediabetes: Secondary | ICD-10-CM

## 2018-05-01 DIAGNOSIS — D563 Thalassemia minor: Secondary | ICD-10-CM | POA: Insufficient documentation

## 2018-05-01 DIAGNOSIS — Z803 Family history of malignant neoplasm of breast: Secondary | ICD-10-CM

## 2018-05-01 DIAGNOSIS — K219 Gastro-esophageal reflux disease without esophagitis: Secondary | ICD-10-CM | POA: Diagnosis not present

## 2018-05-01 LAB — COMPREHENSIVE METABOLIC PANEL WITH GFR
ALT: 18 U/L (ref 0–44)
AST: 18 U/L (ref 15–41)
Albumin: 4.4 g/dL (ref 3.5–5.0)
Alkaline Phosphatase: 54 U/L (ref 38–126)
Anion gap: 8 (ref 5–15)
BUN: 14 mg/dL (ref 6–20)
CO2: 27 mmol/L (ref 22–32)
Calcium: 10 mg/dL (ref 8.9–10.3)
Chloride: 105 mmol/L (ref 98–111)
Creatinine, Ser: 1.17 mg/dL (ref 0.61–1.24)
GFR calc Af Amer: >60 mL/min
GFR calc non Af Amer: >60 mL/min
Glucose, Bld: 99 mg/dL (ref 70–99)
Potassium: 4.1 mmol/L (ref 3.5–5.1)
Sodium: 140 mmol/L (ref 135–145)
Total Bilirubin: 0.5 mg/dL (ref 0.3–1.2)
Total Protein: 8.1 g/dL (ref 6.5–8.1)

## 2018-05-01 LAB — IRON AND TIBC
Iron: 105 ug/dL (ref 42–163)
Saturation Ratios: 34 % (ref 20–55)
TIBC: 306 ug/dL (ref 202–409)
UIBC: 201 ug/dL (ref 117–376)

## 2018-05-01 LAB — CBC WITH DIFFERENTIAL (CANCER CENTER ONLY)
Abs Immature Granulocytes: 0.01 10*3/uL (ref 0.00–0.07)
Basophils Absolute: 0 10*3/uL (ref 0.0–0.1)
Basophils Relative: 1 %
Eosinophils Absolute: 0.1 10*3/uL (ref 0.0–0.5)
Eosinophils Relative: 1 %
HCT: 46.5 % (ref 39.0–52.0)
Hemoglobin: 14.5 g/dL (ref 13.0–17.0)
Immature Granulocytes: 0 %
Lymphocytes Relative: 34 %
Lymphs Abs: 1.5 10*3/uL (ref 0.7–4.0)
MCH: 22.1 pg — ABNORMAL LOW (ref 26.0–34.0)
MCHC: 31.2 g/dL (ref 30.0–36.0)
MCV: 70.9 fL — ABNORMAL LOW (ref 80.0–100.0)
Monocytes Absolute: 0.3 10*3/uL (ref 0.1–1.0)
Monocytes Relative: 6 %
Neutro Abs: 2.5 10*3/uL (ref 1.7–7.7)
Neutrophils Relative %: 58 %
Platelet Count: 284 10*3/uL (ref 150–400)
RBC: 6.56 MIL/uL — ABNORMAL HIGH (ref 4.22–5.81)
RDW: 14.5 % (ref 11.5–15.5)
WBC Count: 4.4 10*3/uL (ref 4.0–10.5)
nRBC: 0 % (ref 0.0–0.2)

## 2018-05-01 LAB — FERRITIN: Ferritin: 270 ng/mL (ref 24–336)

## 2018-05-01 LAB — LACTATE DEHYDROGENASE: LDH: 134 U/L (ref 98–192)

## 2018-05-01 LAB — RETICULOCYTES
Immature Retic Fract: 3.2 % (ref 2.3–15.9)
RBC.: 6.56 MIL/uL — ABNORMAL HIGH (ref 4.22–5.81)
Retic Count, Absolute: 47.2 10*3/uL (ref 19.0–186.0)
Retic Ct Pct: 0.7 % (ref 0.4–3.1)

## 2018-05-01 NOTE — Progress Notes (Signed)
Hematology/Oncology Outpatient Consultation   Patient Name:  Ryan Rowe  DOB: 1985/12/18   Date of Service: May 01, 2018  Referring Provider: Jarold Motto, PA-C [address] [place], [place] [address]   Consulting Physician: Toni Arthurs, MD Hematology/Oncology  Reason for Referral: In the setting of hypochromic and microcytic red blood cells with slightly elevated red blood cell mass; without evidence of anemia, he presents now for further diagnostic and therapeutic recommendations.  History Present Illness: Ryan Rowe is a 32 year old resident of [place] whose past medical history is significant for primary hypertension; prediabetes mellitus; allergic rhinitis; gastroesophageal reflux disease since the age of 36 years; anxiety/panic disorder.  His primary care provider is Jarold Motto, physician assistant.  He is alone at this first visit.  From as far back as October 18, 2012 Trey has had an abnormal blood count.  The results of that study showed hemoglobin 13.4 hematocrit 43.1 MCV 70.9 MCH 22.0 RDW 15.0 WBC 5.9; platelets 295,000.  Nearly 4 months ago, a complete blood count showed hemoglobin 14.9 hematocrit 45.5 MCV 69.1 MCH 22.6 WBC 4.8; platelets 277,000.  2 weeks earlier a complete blood count on April 16, 2018 showed RBC 6.62 hemoglobin 14.5 hematocrit 47.2 MCV 71.3 MCH 21.9 RDW 14.8 WBC 4.9 with 54% neutrophils 36% lymphocytes 8% monocytes 2% eosinophils 1% basophil; platelets 331,000.  He has no coronary artery disease, angina, or cardiac dysrhythmia.  He denies stroke syndrome or seizure disorder.  He has no thyroid disease.  There is no peptic ulcer disease.  Since his teens he has had gastroesophageal reflux disease.  He currently takes esomeprazole: 20 mg as needed.  He has no viral hepatitis, inflammatory bowel disease, or symptomatic diverticulosis.  He has no rheumatoid or gouty arthritis.  He has dull aching, tingling pain in his left shoulder radiating  to his hand for the past month.  He was prescribed physical therapy with modest benefit.  He is followed by orthopedic surgery.  He has no prior bleeding disorder or easy bruisability.  There is no blood disorder in his immediate family.  He reports no recent travel outside the country.  He has no peripheral arterial venous thromboembolic disease.  Jory is single, never married.  He works in Production designer, theatre/television/film.  He denies any energy deficit.  His exercise regimen has been limited due to his left arm pain.  He has no appetite or decrease in weight.  There are no visual changes or hearing deficit.  He reports no unusual headache, dizziness, lightheadedness, syncope, or near syncopal episodes.  There is no rash or itching.  He reports no cough, sore throat, or orthopnea.  He has no dyspnea either at rest or on exertion.  There is no pain or difficulty in swallowing.  No fever, shaking chills, sweats, or flulike symptoms are evident.    He has occasional dyspepsia when he does not take esomeprazole.  There is no nausea, vomiting, diarrhea, or constipation.  He has no urinary frequency, urgency, hematuria, or dysuria.  There are no new arthralgias or myalgias.  He has no bleeding tendency or easy bruisability.  Except for intermittent tingling of his left arm associated with aching pain he reports no paresthesias in the either the right arm or feet.  It is with this background he presents now for further diagnostic and therapeutic recommendations in the setting of persistent hypochromic and microcytic red blood cells with a reciprocal increase in total red cell volume as outlined above.  Past Medical History:  Diagnosis Date  . GERD (gastroesophageal reflux disease)   . Hypertension   . Pre-diabetes    Past Surgical History:  Procedure Laterality Date  . FRACTURE SURGERY Right    wrist   Family History  Problem Relation Age of Onset  . Depression Mother   . Diabetes Mother        controlled w/o medication    . Hypertension Father   . Diabetes Other   . Cancer Maternal Aunt        breast  . ALS Maternal Grandfather   . Diabetes Maternal Grandfather   . Cancer Maternal Aunt        breast  . Heart disease Cousin 50  . Hypertension Paternal Grandmother   . Hypertension Paternal Grandfather   . Stroke Neg Hx   Mother: Age 12 years: Anxiety disorder/chronic depression Father: Age 33 years: DM Brothers (2): GERD Sisters (1): No medical problems  Social History   Socioeconomic History  . Marital status: Single    Spouse name: Not on file  . Number of children: Not on file  . Years of education: Not on file  . Highest education level: Not on file  Occupational History  . Not on file  Social Needs  . Financial resource strain: Not on file  . Food insecurity:    Worry: Not on file    Inability: Not on file  . Transportation needs:    Medical: Not on file    Non-medical: Not on file  Tobacco Use  . Smoking status: Never Smoker  . Smokeless tobacco: Never Used  Substance and Sexual Activity  . Alcohol use: Yes    Comment: Socially  . Drug use: No  . Sexual activity: Yes  Lifestyle  . Physical activity:    Days per week: Not on file    Minutes per session: Not on file  . Stress: Not on file  Relationships  . Social connections:    Talks on phone: Not on file    Gets together: Not on file    Attends religious service: Not on file    Active member of club or organization: Not on file    Attends meetings of clubs or organizations: Not on file    Relationship status: Not on file  . Intimate partner violence:    Fear of current or ex partner: Not on file    Emotionally abused: Not on file    Physically abused: Not on file    Forced sexual activity: Not on file  Other Topics Concern  . 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  Exercise - regularly. Diet - healthy.     Early is single, never  married. He has no children. He works in Production designer, theatre/television/film. He does not smoke cigarettes. More recently he started to smoke cigars, several times monthly His alcohol intake is occasional at present. He drank more heavily in the past but never to excess.  Transfusion History: No prior transfusion  Exposure History: He has no known exposure to specific toxic chemicals, radiation, or pesticides. 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  Allergies:  He has no known medical allergies He has no food allergies He has nonspecific seasonal allergies  Current Outpatient Medications on File Prior to Visit  Medication Sig  . albuterol (PROVENTIL HFA;VENTOLIN HFA) 108 (90 Base) MCG/ACT inhaler Inhale 2 puffs into the  lungs every 4 (four) hours as needed for wheezing or shortness of breath (cough, shortness of breath or wheezing.).  Marland Kitchen Butalbital-APAP-Caffeine 50-300-40 MG CAPS Take 1 capsule by mouth every 6 (six) hours.  . Fexofenadine HCl (ALLEGRA ALLERGY PO) Take 1 tablet by mouth daily as needed (allergies).  . hydrOXYzine (ATARAX/VISTARIL) 25 MG tablet Take 1 tablet (25 mg total) by mouth at bedtime as needed for anxiety (insomnia). Take one 25 mg tablet as needed for anxiety. May increase to 2 tabs.  Marland Kitchen lisinopril (PRINIVIL,ZESTRIL) 20 MG tablet TAKE 1 TABLET BY MOUTH EVERY DAY  . metFORMIN (GLUCOPHAGE) 500 MG tablet TAKE 1 TABLET BY MOUTH DAILY WITH BREAKFAST  . ranitidine (ZANTAC) 150 MG tablet Take 1 tablet (150 mg total) by mouth 2 (two) times daily.   No current facility-administered medications on file prior to visit.     Review of Systems: Constitutional: No fever, sweats, or shaking chills.  No appetite or weight deficit.  Energy level stable. Skin: No rash, scaling, sores, lumps, or jaundice. HEENT: No visual changes or hearing deficit; allergic rhinitis. Pulmonary: No unusual cough, sore throat, or orthopnea. Cardiovascular: No coronary artery disease, angina,  or myocardial infarction.  No cardiac dysrhythmia; essential hypertension; no dyslipidemia. Gastrointestinal: No indigestion, dysphagia, abdominal pain, diarrhea, or constipation.  No change in bowel habits.  No nausea or vomiting.  No melena or bright red blood per rectum.  GERD. Genitourinary: No urinary frequency, urgency, hematuria, or dysuria. Musculoskeletal: Dull aching, tingling pain extending from the left shoulder to the hand over the past 4 weeks.  No new arthralgias or myalgias; no joint swelling, pain, or instability. Hematologic: No bleeding tendency or easy bruisability. Endocrine: No intolerance to heat or cold; no thyroid disease; prediabetes mellitus. Vascular: No peripheral arterial or venous thromboembolic disease. Psychological: Anxiety/panic disorder; no depression, or mood changes; tobacco dependence. Neurological: No dizziness, lightheadedness, syncope, or near syncopal episodes; no numbness or tingling in the fingers or toes.  Numbness on an intermittent basis in the left arm.  Physical Examination: Vital Signs: Body surface area is 2.33 meters squared.  Vitals:   05/01/18 0857  BP: (!) 147/92  Pulse: 72  Resp: 16  Temp: 98.3 F (36.8 C)  SpO2: 100%    Filed Weights   05/01/18 0857  Weight: 240 lb 4.8 oz (109 kg)   ECOG PERFORMANCE STATUS: 0 Constitutional:  Kross Swallows is a fully nourished and developed African-American.  He looks age appropriate.  He is friendly and cooperative without respiratory compromise at rest. Skin: No rashes, scaling, dryness, jaundice, or itching. HEENT: Head is normocephalic and atraumatic.  Pupils are equal round and reactive to light and accommodation.  Sclerae are anicteric.  Conjunctivae are pink.  No sinus tenderness nor oropharyngeal lesions.  Lips without cracking or peeling; tongue without mass, inflammation, or nodularity.  Mucous membranes are moist. Neck: Supple and symmetric.  No jugular venous distention or thyromegaly.   Trachea is midline. Lymphatics: No cervical or supraclavicular lymphadenopathy.  No epitrochlear, axillary, or inguinal lymphadenopathy is appreciated. Respiratory/chest: Thorax is symmetrical.  Breath sounds are clear to auscultation and percussion.  Normal excursion and respiratory effort. Back: Symmetric without deformity or tenderness. Cardiovascular: Heart rate and rhythm are regular without murmurs, gallops, or rubs. Gastrointestinal: Abdomen is soft, nontender; no organomegaly.  Bowel sounds are normoactive.  No masses are appreciated. Genitourinary: Normal external male genitalia. Extremities: In the lower extremities, there is no asymmetric swelling, erythema, tenderness, or cord formation.  No clubbing, cyanosis, nor  edema. Hematologic: No petechiae, hematomas, or ecchymoses. Psychological:  He is oriented to person, place, and time; normal affect, memory, and cognition. Neurological: There are no gross neurologic deficits.  Laboratory Results: I have reviewed the data as listed: May 01, 2018  Ref Range & Units 10:34 2wk ago 6921mo ago 921mo ago 221mo ago  WBC Count 4.0 - 10.5 K/uL 4.4  4.9 R 5.7  4.8  4.2   RBC 4.22 - 5.81 MIL/uL 6.56High   6.62High  R 5.94High   6.58High   6.01High  R  Hemoglobin 13.0 - 17.0 g/dL 16.1  09.6 R 04.5  40.9  13.4   HCT 39.0 - 52.0 % 46.5  47.2 R 41.4  45.5  42.4   MCV 80.0 - 100.0 fL 70.9Low   71.3Low   69.7Low  R 69.1Low  R 70.5Low  R  MCH 26.0 - 34.0 pg 22.1Low   21.9Low  R 22.7Low   22.6Low     MCHC 30.0 - 36.0 g/dL 81.1  91.4NWG  R 95.6  32.7  31.7   RDW 11.5 - 15.5 % 14.5  14.8 R 14.6  14.6  14.9   Platelet Count 150 - 400 K/uL 284  331 R 287 CM 277 CM 272.0 R  nRBC 0.0 - 0.2 % 0.0       Neutrophils Relative % % 58  54.2      Neutro Abs 1.7 - 7.7 K/uL 2.5  2,656 R     Lymphocytes Relative % 34       Lymphs Abs 0.7 - 4.0 K/uL 1.5  1,749 R     Monocytes Relative % 6  7.5      Monocytes Absolute 0.1 - 1.0 K/uL 0.3       Eosinophils Relative % 1   1.6      Eosinophils Absolute 0.0 - 0.5 K/uL 0.1  78 R     Basophils Relative % 1  1.0      Basophils Absolute 0.0 - 0.1 K/uL 0.0  49 R     Immature Granulocytes % 0       Abs Immature Granulocytes 0.00 - 0.07 K/uL 0.01         Ref Range & Units 10:34  Retic Ct Pct 0.4 - 3.1 % 0.7   RBC. 4.22 - 5.81 MIL/uL 6.56High    Retic Count, Absolute 19.0 - 186.0 K/uL 47.2   Immature Retic Fract 2.3 - 15.9 % 3.2   .   Ref Range & Units 10:34 (05/01/18) 6921mo ago (03/22/18) 921mo ago (12/29/17) 921mo ago (12/26/17) 221mo ago (09/18/17)  Sodium 135 - 145 mmol/L 140  138  133Low  R 137  139 R  Potassium 3.5 - 5.1 mmol/L 4.1  3.2Low   3.8 R 3.4Low   4.1 R  Chloride 98 - 111 mmol/L 105  104  96 R 101 CM 104 R  CO2 22 - 32 mmol/L 27  22  28  R 25  31 R  Glucose, Bld 70 - 99 mg/dL 99  213YQMV   784ONGE   100High  CM 110High    BUN 6 - 20 mg/dL 14  18  13  R 13 CM 12 R  Creatinine, Ser 0.61 - 1.24 mg/dL 9.52  1.25High   1.14 R 1.12  0.99 R  Calcium 8.9 - 10.3 mg/dL 84.1  9.1  32.4 R 9.6  9.3 R  Total Protein 6.5 - 8.1 g/dL 8.1    8.0    Albumin  3.5 - 5.0 g/dL 4.4    4.4    AST 15 - 41 U/L 18    22    ALT 0 - 44 U/L 18    22 CM   Alkaline Phosphatase 38 - 126 U/L 54    51    Total Bilirubin 0.3 - 1.2 mg/dL 0.5    1.6XWRU     GFR calc non Af Amer >60 mL/min >60  >60   >60    GFR calc Af Amer >60 mL/min >60  >60 CM  >60 CM   Comment: (NOTE)       Ferritin 270 Iron/TIBC 105/306 Iron saturation 34%  Diagnostic/Imaging Studies: March 27, 2018 CERVICAL SPINE - COMPLETE 4+ VIEW  COMPARISON:  None.  FINDINGS: There is mild loss of the normal cervical lordosis. The cervical vertebral bodies are preserved in height. The disc space heights are well maintained. The odontoid is intact. There is no spinous process fracture. The prevertebral soft tissue spaces are normal. The neural foramina on the right appear widely patent. Those on the left cannot be adequately assessed due to suboptimal positioning of the  patient for this view.  IMPRESSION: There is no acute or significant chronic bony abnormality of the visualized portions of the cervical spine. Mild loss of the normal cervical lordosis may reflect muscle spasm.  David  [place] M.D. 03/27/2018 10:21  Summary/Assessment: In the setting of hypochromic and microcytic red blood cells with slightly elevated red blood cell mass; without evidence of anemia, he presents now for further diagnostic and therapeutic recommendations.  From as far back as October 18, 2012 Zeshan has had an abnormal blood count.  The results of that study showed hemoglobin 13.4 hematocrit 43.1 MCV 70.9 MCH 22.0 RDW 15.0 WBC 5.9; platelets 295,000.  Nearly 4 months ago, a complete blood count showed hemoglobin 14.9 hematocrit 45.5 MCV 69.1 MCH 22.6 WBC 4.8; platelets 277,000.  2 weeks earlier a complete blood count on April 16, 2018 showed RBC 6.62 hemoglobin 14.5 hematocrit 47.2 MCV 71.3 MCH 21.9 RDW 14.8 WBC 4.9 with 54% neutrophils 36% lymphocytes 8% monocytes 2% eosinophils 1% basophil; platelets 331,000.  He has no coronary artery disease, angina, or cardiac dysrhythmia.  He denies stroke syndrome or seizure disorder.  He has no thyroid disease.  There is no peptic ulcer disease.  Since his teens he has had gastroesophageal reflux disease.  He currently takes esomeprazole: 20 mg as needed.  He has no viral hepatitis, inflammatory bowel disease, or symptomatic diverticulosis.  He has no rheumatoid or gouty arthritis.  He has dull aching, tingling pain in his left shoulder radiating to his hand for the past month.  He was prescribed physical therapy with modest benefit.  He is followed by orthopedic surgery.  He has no peripheral arterial venous thromboembolic disease.  Deeric is single, never married.  He works in Production designer, theatre/television/film. There is no blood disorder in his immediate family.  He reports no recent travel outside the country. He denies any energy deficit.  His exercise regimen  has been limited due to his left arm pain.  He has no appetite or decrease in weight.  There are no visual changes or hearing deficit.  He reports no unusual headache, dizziness, lightheadedness, syncope, or near syncopal episodes.  There is no rash or itching.  He reports no cough, sore throat, or orthopnea.    He has no dyspnea either at rest or on exertion.  There is no pain or difficulty in swallowing.  No fever, shaking chills, sweats, or flulike symptoms are evident. He has occasional dyspepsia when he does not take esomeprazole.  There is no nausea, vomiting, diarrhea, or constipation.  He has no urinary frequency, urgency, hematuria, or dysuria.  There are no new arthralgias or myalgias.  He has no bleeding tendency or easy bruisability.  Except for intermittent tingling of his left arm associated with aching pain he reports no paresthesias in the either the right arm or feet.   Recommendation/Plan: We discussed in detail the results of his previous laboratory studies dating back at least 5 years.  While his hypochromic and microcytic anemia are not new, his slight elevation in red blood cell mass was of concern.  That too has not changed significantly over the past 5 years.  Laboratory studies were obtained today to exclude an underlying iron deficiency, hemoglobinopathy, or hemolytic process.  Those results were not available at the time of discharge.  Some of those results are detailed above.  Barring any unforeseen complications, his next scheduled doctor visit to discuss those results is on November 20.  Her was advised to call us in the interim should any new or untoward problems arise.  The total time spent discussing the history and temporal profile of his previous laboratory studies, role, rationale, and methodology for evaluating hypochromic and microcytic anemia with a modest elevation of red blood cell mass was 50 minutes.  At least 50% of that time was spent in face-to-face  discussion, reviewing outside records, laboratory evaluation, counseling, and answering questions.  There was ample time allotted for questions and answers.  This note was dictated using voice activated technology/software.  Unfortunately, typographical errors are not uncommon, and transcription is subject to mistakes and regrettably misinterpretation.  If necessary, clarification of the above information can be discussed with me at any time.  Thank you [name] for allowing my participation in the care of Keyler Hoge. I will keep you closely informed as the results of his preliminary laboratory data become available.  Please do not hesitate to call should any questions arise regarding this initial consultation and discussion.  FOLLOW UP: AS DIRECTED   cc:         Jarold Motto, PA-C    Toni Arthurs, MD  Hematology/Oncology [facility] [address] [place], [place] [address] Office: [phone] Main: [phone]

## 2018-05-01 NOTE — Patient Instructions (Signed)
We discussed in detail the laboratory studies dating back 5 years, and more recent lab work which suggests no anemia but small red blood cells.  Your red blood cell volume is slightly increased.  Laboratory studies will be obtained today to exclude an underlying iron deficiency, abnormal hemoglobin molecule, or premature destruction of red blood cells.  Barring any unforeseen complications, your neck scheduled doctor visit to discuss those results is on November 20.  Please do not hesitate to call should any questions arise in the interim.  Thank you! Debbe Odea, MD Hematology/Oncology

## 2018-05-01 NOTE — Telephone Encounter (Signed)
Appts scheduled avs/calendar printed per 11/5 los °

## 2018-05-02 LAB — HEMOGLOBINOPATHY EVALUATION
Hgb A2 Quant: 1.8 % (ref 1.8–3.2)
Hgb A: 98.2 % (ref 96.4–98.8)
Hgb C: 0 %
Hgb F Quant: 0 % (ref 0.0–2.0)
Hgb S Quant: 0 %
Hgb Variant: 0 %

## 2018-05-02 LAB — HAPTOGLOBIN: Haptoglobin: 128 mg/dL (ref 34–200)

## 2018-05-04 ENCOUNTER — Encounter: Payer: Self-pay | Admitting: Physician Assistant

## 2018-05-04 LAB — T4, FREE: T4,Free (Direct): 1.3

## 2018-05-04 LAB — FOLATE: Folate: 7.4

## 2018-05-04 LAB — FERRITIN: Ferritin: 389

## 2018-05-07 ENCOUNTER — Other Ambulatory Visit: Payer: Self-pay | Admitting: Physician Assistant

## 2018-05-16 ENCOUNTER — Inpatient Hospital Stay (HOSPITAL_BASED_OUTPATIENT_CLINIC_OR_DEPARTMENT_OTHER): Payer: BLUE CROSS/BLUE SHIELD | Admitting: Hematology and Oncology

## 2018-05-16 ENCOUNTER — Telehealth: Payer: Self-pay | Admitting: Hematology and Oncology

## 2018-05-16 ENCOUNTER — Inpatient Hospital Stay: Payer: BLUE CROSS/BLUE SHIELD

## 2018-05-16 DIAGNOSIS — D751 Secondary polycythemia: Secondary | ICD-10-CM

## 2018-05-16 DIAGNOSIS — R718 Other abnormality of red blood cells: Secondary | ICD-10-CM | POA: Diagnosis not present

## 2018-05-16 DIAGNOSIS — R7303 Prediabetes: Secondary | ICD-10-CM | POA: Diagnosis not present

## 2018-05-16 DIAGNOSIS — R202 Paresthesia of skin: Secondary | ICD-10-CM

## 2018-05-16 DIAGNOSIS — Z803 Family history of malignant neoplasm of breast: Secondary | ICD-10-CM | POA: Diagnosis not present

## 2018-05-16 DIAGNOSIS — I10 Essential (primary) hypertension: Secondary | ICD-10-CM

## 2018-05-16 DIAGNOSIS — K219 Gastro-esophageal reflux disease without esophagitis: Secondary | ICD-10-CM

## 2018-05-16 NOTE — Telephone Encounter (Signed)
Printed calendar and avs. °

## 2018-05-16 NOTE — Patient Instructions (Signed)
Labs from last visit were reviewed and discussed in detail.  There is no evidence of iron deficiency, premature destruction of cells, abnormal hemoglobin molecule (beta thalassemia) or metabolic abnormality.  Labs from today were requested to rule out alpha thalassemia or copper toxicity.  Barring any unforeseen complications, your next scheduled doctor visit to discuss those results is on December 9.  Please do not hesitate to call should any new or untoward problems arise.  Happy Thanksgiving!

## 2018-05-17 LAB — ERYTHROPOIETIN: Erythropoietin: 4.3 m[IU]/mL (ref 2.6–18.5)

## 2018-05-18 LAB — COPPER, SERUM: Copper: 88 ug/dL (ref 72–166)

## 2018-05-21 ENCOUNTER — Encounter: Payer: Self-pay | Admitting: Hematology and Oncology

## 2018-05-21 NOTE — Progress Notes (Signed)
Hematology/Oncology Outpatient Progress Note   Patient Name:  Ryan Rowe  DOB: 1986-01-20   Date of Service: May 16, 2018  Referring Provider: Jarold MottoSamantha Worley, PA-C [address] [place], [place] [address]   Consulting Physician: Toni Arthursichard H , MD Hematology/Oncology  Reason for Visit: In the setting of hypochromic and microcytic red blood cells with slightly elevated red blood cell mass; without evidence of anemia, he presents now for the results of his preliminary laboratory evaluation and recommendations.  Brief History: Ryan Rowe is a 32 year old resident of [place] whose past medical history is significant for primary hypertension; prediabetes mellitus; allergic rhinitis; gastroesophageal reflux disease since the age of 32 years; anxiety/panic disorder.  His primary care provider is Jarold MottoSamantha Worley, physician assistant.  He is at this first visit. From as far back as October 18, 2012 Ryan Rowe has had an abnormal blood count. The results of that study showed hemoglobin 13.4 hematocrit 43.1 MCV 70.9 MCH 22.0 RDW 15.0 WBC 5.9; platelets 295,000.  Nearly 4 months ago, a complete blood count showed hemoglobin 14.9 hematocrit 45.5 MCV 69.1 MCH 22.6 WBC 4.8; platelets 277,000.  2 weeks earlier a complete blood count on April 16, 2018 showed RBC 6.62 hemoglobin 14.5 hematocrit 47.2 MCV 71.3 MCH 21.9 RDW 14.8 WBC 4.9 with 54% neutrophils 36% lymphocytes 8% monocytes 2% eosinophils 1% basophil; platelets 331,000.  Since his teens he has had gastroesophageal reflux disease.  He currently takes esomeprazole: 20 mg as needed. He has dull aching, tingling pain in his left shoulder radiating to his hand for the past month.  He was prescribed physical therapy with modest benefit.  He is followed by orthopedic surgery.  He has no prior bleeding disorder or easy bruisability. There is no blood disorder in his immediate family.  He reports no recent travel outside the country. Ryan Rowe is single,  never married.  He works in Production designer, theatre/television/filmmaintenance. His exercise regimen has been limited due to his left arm pain.  At the time of his initial visit, we discussed in detail the results of his previous laboratory studies dating back at least 5 years.  While his hypochromic and microcytic anemia are not new, his slight elevation in red blood cell mass was of concern. That too has not changed significantly over the past 5 years. Laboratory studies were obtained to exclude an underlying iron deficiency, hemoglobinopathy, or hemolytic process.  It is with this background he presents now to discuss the results of his preliminary evaluation with recommendations in the setting of persistent hypochromic and microcytic red blood cells with a reciprocal increase in total red cell volume as outlined above.  Interval History: In the interim since his last visit, he reports no new problems or complaints.  He denies any energy deficit.  He has no appetite or decrease in weight.  There are no visual changes or hearing deficit.  He reports no unusual headache, dizziness, lightheadedness, syncope, or near syncopal episodes.  There is no rash or itching.  He reports no cough, sore throat, or orthopnea.  He has no dyspnea either at rest or on exertion. There is no pain or difficulty in swallowing.  No fever, shaking chills, sweats, or flulike symptoms are evident.  There is no chest pain.  He has occasional dyspepsia when he does not take esomeprazole.  There is no nausea, vomiting, diarrhea, or constipation.  He has no melena or bright red blood per rectum.  He has no urinary frequency, urgency, hematuria, or dysuria.  There are no new  arthralgias or myalgias.  He has no bleeding tendency or easy bruisability.  Except for intermittent tingling of his left arm associated with aching pain he reports no paresthesias in the either the right arm or feet.   Past Medical History:  Diagnosis Date  . GERD (gastroesophageal reflux disease)   .  Hypertension   . Pre-diabetes    Past Surgical History:  Procedure Laterality Date  . FRACTURE SURGERY Right    wrist   Allergies:  He has no known medical allergies He has no food allergies He has nonspecific seasonal allergies  Current Outpatient Medications on File Prior to Visit  Medication Sig  . albuterol (PROVENTIL HFA;VENTOLIN HFA) 108 (90 Base) MCG/ACT inhaler Inhale 2 puffs into the lungs every 4 (four) hours as needed for wheezing or shortness of breath (cough, shortness of breath or wheezing.).  Marland Kitchen Butalbital-APAP-Caffeine 50-300-40 MG CAPS Take 1 capsule by mouth every 6 (six) hours.  . Fexofenadine HCl (ALLEGRA ALLERGY PO) Take 1 tablet by mouth daily as needed (allergies).  . hydrOXYzine (ATARAX/VISTARIL) 25 MG tablet Take 1 tablet (25 mg total) by mouth at bedtime as needed for anxiety (insomnia). Take one 25 mg tablet as needed for anxiety. May increase to 2 tabs.  Marland Kitchen lisinopril (PRINIVIL,ZESTRIL) 20 MG tablet TAKE 1 TABLET BY MOUTH EVERY DAY  . metFORMIN (GLUCOPHAGE) 500 MG tablet TAKE 1 TABLET BY MOUTH DAILY WITH BREAKFAST  . ranitidine (ZANTAC) 150 MG tablet Take 1 tablet (150 mg total) by mouth 2 (two) times daily.   No current facility-administered medications on file prior to visit.     Review of Systems: Constitutional: No fever, sweats, or shaking chills.  No appetite or weight deficit.  Energy level stable. Skin: No rash, scaling, sores, lumps, or jaundice. HEENT: No visual changes or hearing deficit; allergic rhinitis. Pulmonary: No unusual cough, sore throat, or orthopnea. Cardiovascular: No coronary artery disease, angina, or myocardial infarction.  No cardiac dysrhythmia; essential hypertension; no dyslipidemia. Gastrointestinal: No indigestion, dysphagia, abdominal pain, diarrhea, or constipation.  No change in bowel habits.  No nausea or vomiting.  No melena or bright red blood per rectum.  GERD. Genitourinary: No urinary frequency, urgency, hematuria, or  dysuria. Musculoskeletal: Dull aching, tingling pain extending from the left shoulder to the hand over the past 4 weeks.  No new arthralgias or myalgias; no joint swelling, pain, or instability. Hematologic: No bleeding tendency or easy bruisability. Endocrine: No intolerance to heat or cold; no thyroid disease; prediabetes mellitus. Vascular: No peripheral arterial or venous thromboembolic disease. Psychological: Anxiety/panic disorder; no depression, or mood changes; tobacco dependence. Neurological: No dizziness, lightheadedness, syncope, or near syncopal episodes; no numbness or tingling in the fingers or toes.  Numbness on an intermittent basis in the left arm.  Physical Examination: Vital Signs: As per nursing assessment ECOG PERFORMANCE STATUS: 0 Constitutional:  Kylar Leonhardt is a fully nourished and developed African-American.  He looks age appropriate.  He is friendly and cooperative without respiratory compromise at rest. Skin: No rashes, scaling, dryness, jaundice, or itching. HEENT: Head is normocephalic and atraumatic.  Pupils are equal round and reactive to light and accommodation.  Sclerae are anicteric.  Conjunctivae are pink.  No sinus tenderness nor oropharyngeal lesions.  Lips without cracking or peeling; tongue without mass, inflammation, or nodularity.  Mucous membranes are moist. Neck: Supple and symmetric.  No jugular venous distention or thyromegaly.  Trachea is midline. Lymphatics: No cervical or supraclavicular lymphadenopathy.  No epitrochlear, axillary, or inguinal lymphadenopathy is appreciated.  Respiratory/chest: Thorax is symmetrical.  Breath sounds are clear to auscultation and percussion.  Normal excursion and respiratory effort. Back: Symmetric without deformity or tenderness. Cardiovascular: Heart rate and rhythm are regular without murmurs, gallops, or rubs. Gastrointestinal: Abdomen is soft, nontender; no organomegaly.  Bowel sounds are normoactive.  No masses are  appreciated. Extremities: In the lower extremities, there is no asymmetric swelling, erythema, tenderness, or cord formation.  No clubbing, cyanosis, nor edema. Hematologic: No petechiae, hematomas, or ecchymoses. Psychological:  He is oriented to person, place, and time; normal affect, memory, and cognition. Neurological: There are no gross neurologic deficits.  Laboratory Results: I have reviewed the data as listed: May 01, 2018  Ref Range & Units 10:34 2wk ago 70mo ago 25mo ago 83mo ago  WBC Count 4.0 - 10.5 K/uL 4.4  4.9 R 5.7  4.8  4.2   RBC 4.22 - 5.81 MIL/uL 6.56High   6.62High  R 5.94High   6.58High   6.01High  R  Hemoglobin 13.0 - 17.0 g/dL 16.1  09.6 R 04.5  40.9  13.4   HCT 39.0 - 52.0 % 46.5  47.2 R 41.4  45.5  42.4   MCV 80.0 - 100.0 fL 70.9Low   71.3Low   69.7Low  R 69.1Low  R 70.5Low  R  MCH 26.0 - 34.0 pg 22.1Low   21.9Low  R 22.7Low   22.6Low     MCHC 30.0 - 36.0 g/dL 81.1  91.4NWG  R 95.6  32.7  31.7   RDW 11.5 - 15.5 % 14.5  14.8 R 14.6  14.6  14.9   Platelet Count 150 - 400 K/uL 284  331 R 287 CM 277 CM 272.0 R  nRBC 0.0 - 0.2 % 0.0       Neutrophils Relative % % 58  54.2      Neutro Abs 1.7 - 7.7 K/uL 2.5  2,656 R     Lymphocytes Relative % 34       Lymphs Abs 0.7 - 4.0 K/uL 1.5  1,749 R     Monocytes Relative % 6  7.5      Monocytes Absolute 0.1 - 1.0 K/uL 0.3       Eosinophils Relative % 1  1.6      Eosinophils Absolute 0.0 - 0.5 K/uL 0.1  78 R     Basophils Relative % 1  1.0      Basophils Absolute 0.0 - 0.1 K/uL 0.0  49 R     Immature Granulocytes % 0       Abs Immature Granulocytes 0.00 - 0.07 K/uL 0.01         Ref Range & Units 10:34  Retic Ct Pct 0.4 - 3.1 % 0.7   RBC. 4.22 - 5.81 MIL/uL 6.56High    Retic Count, Absolute 19.0 - 186.0 K/uL 47.2   Immature Retic Fract 2.3 - 15.9 % 3.2   .   Ref Range & Units 10:34 (05/01/18) 70mo ago (03/22/18) 25mo ago (12/29/17) 25mo ago (12/26/17) 83mo ago (09/18/17)  Sodium 135 - 145 mmol/L 140  138  133Low  R 137  139  R  Potassium 3.5 - 5.1 mmol/L 4.1  3.2Low   3.8 R 3.4Low   4.1 R  Chloride 98 - 111 mmol/L 105  104  96 R 101 CM 104 R  CO2 22 - 32 mmol/L 27  22  28  R 25  31 R  Glucose, Bld 70 - 99 mg/dL 99  213YQMV  130High   100High  CM 110High    BUN 6 - 20 mg/dL 14  18  13  R 13 CM 12 R  Creatinine, Ser 0.61 - 1.24 mg/dL 1.61  1.25High   1.14 R 1.12  0.99 R  Calcium 8.9 - 10.3 mg/dL 09.6  9.1  04.5 R 9.6  9.3 R  Total Protein 6.5 - 8.1 g/dL 8.1    8.0    Albumin 3.5 - 5.0 g/dL 4.4    4.4    AST 15 - 41 U/L 18    22    ALT 0 - 44 U/L 18    22 CM   Alkaline Phosphatase 38 - 126 U/L 54    51    Total Bilirubin 0.3 - 1.2 mg/dL 0.5    4.0JWJX     GFR calc non Af Amer >60 mL/min >60  >60   >60    GFR calc Af Amer >60 mL/min >60  >60 CM  >60 CM   Comment: (NOTE)       Ferritin 270 Iron/TIBC 105/306 Iron saturation 34% LDH 134 Reticulocyte count 0.7% Hemoglobin electrophoresis: Normal adult hemoglobin Haptoglobin 128  Diagnostic/Imaging Studies: March 27, 2018 CERVICAL SPINE - COMPLETE 4+ VIEW  COMPARISON:  None.  FINDINGS: There is mild loss of the normal cervical lordosis. The cervical vertebral bodies are preserved in height. The disc space heights are well maintained. The odontoid is intact. There is no spinous process fracture. The prevertebral soft tissue spaces are normal. The neural foramina on the right appear widely patent. Those on the left cannot be adequately assessed due to suboptimal positioning of the patient for this view.  IMPRESSION: There is no acute or significant chronic bony abnormality of the visualized portions of the cervical spine. Mild loss of the normal cervical lordosis may reflect muscle spasm.  David  [place] M.D. 03/27/2018 10:21  Summary/Assessment: In the setting of hypochromic and microcytic red blood cells with slightly elevated red blood cell mass; without evidence of anemia, he presents now for the results of his preliminary laboratory  evaluation and recommendations  From as far back as October 18, 2012 Finnbar has had an abnormal blood count. The results of that study showed hemoglobin 13.4 hematocrit 43.1 MCV 70.9 MCH 22.0 RDW 15.0 WBC 5.9; platelets 295,000.  Nearly 4 months ago, a complete blood count showed hemoglobin 14.9 hematocrit 45.5 MCV 69.1 MCH 22.6 WBC 4.8; platelets 277,000.  2 weeks earlier a complete blood count on April 16, 2018 showed RBC 6.62 hemoglobin 14.5 hematocrit 47.2 MCV 71.3 MCH 21.9 RDW 14.8 WBC 4.9 with 54% neutrophils 36% lymphocytes 8% monocytes 2% eosinophils 1% basophil; platelets 331,000.  Since his teens he has had gastroesophageal reflux disease.  He currently takes esomeprazole: 20 mg as needed. He has dull aching, tingling pain in his left shoulder radiating to his hand for the past month.  He was prescribed physical therapy with modest benefit.  He is followed by orthopedic surgery.  He has no prior bleeding disorder or easy bruisability. There is no blood disorder in his immediate family.  He reports no recent travel outside the country. Jerin is single, never married.  He works in Production designer, theatre/television/film. His exercise regimen has been limited due to his left arm pain.  At the time of his initial visit, we discussed in detail the results of his previous laboratory studies dating back at least 5 years.  While his hypochromic and microcytic anemia are not new, his slight elevation in  red blood cell mass was of concern. That too has not changed significantly over the past 5 years. Laboratory studies were obtained today to exclude an underlying iron deficiency, hemoglobinopathy, or hemolytic process.  In the interim since his last visit, he reports no new problems or complaints.  He denies any energy deficit.  He has no appetite or decrease in weight.  There are no visual changes or hearing deficit.  He reports no unusual headache, dizziness, lightheadedness, syncope, or near syncopal episodes.  There is no rash or  itching.  He reports no cough, sore throat, or orthopnea.  He has no dyspnea either at rest or on exertion. There is no pain or difficulty in swallowing.  No fever, shaking chills, sweats, or flulike symptoms are evident.  There is no chest pain.  He has occasional dyspepsia when he does not take esomeprazole.  There is no nausea, vomiting, diarrhea, or constipation.  He has no melena or bright red blood per rectum.  He has no urinary frequency, urgency, hematuria, or dysuria.  There are no new arthralgias or myalgias.  He has no bleeding tendency or easy bruisability.  Except for intermittent tingling of his left arm associated with aching pain he reports no paresthesias in the either the right arm or feet.    His other comorbid problems include primary hypertension; prediabetes mellitus; allergic rhinitis; gastroesophageal reflux disease since the age of 18 years; anxiety/panic disorder.  His primary care provider is Jarold Motto.  Recommendation/Plan: We discussed in detail the results of his previous laboratory studies from November 5.  There is no evidence of iron deficiency, hemoglobinopathy, or hemolytic process.  We discussed the more remote possibility of alpha thalassemia, copper deficiency, or an underlying myeloproliferative process.  With that in mind, gene mutation for alpha thalassemia, erythropoietin level, and serum copper levels were requested. Those results are pending.  Barring any unforeseen complications, his next scheduled doctor visit to discuss those results is on December 9.  Her was advised to call us in the interim should any new or untoward problems arise.  This note was dictated using voice activated technology/software.  Unfortunately, typographical errors are not uncommon, and transcription is subject to mistakes and regrettably misinterpretation.  If necessary, clarification of the above information can be discussed with me at any time.  FOLLOW UP: AS  DIRECTED   cc:         Jarold Motto, PA-C   Toni Arthurs, MD  Hematology/Oncology [facility] [address] [place], [place] [address] Office: [phone] Main: [phone]

## 2018-05-25 LAB — ALPHA-THALASSEMIA GENOTYPR

## 2018-06-06 ENCOUNTER — Inpatient Hospital Stay: Payer: BLUE CROSS/BLUE SHIELD | Admitting: Hematology and Oncology

## 2018-06-09 ENCOUNTER — Encounter: Payer: Self-pay | Admitting: Sports Medicine

## 2018-06-12 ENCOUNTER — Other Ambulatory Visit: Payer: BLUE CROSS/BLUE SHIELD

## 2018-06-12 ENCOUNTER — Inpatient Hospital Stay: Payer: BLUE CROSS/BLUE SHIELD | Admitting: Nurse Practitioner

## 2018-06-12 ENCOUNTER — Telehealth: Payer: Self-pay | Admitting: Hematology and Oncology

## 2018-06-12 NOTE — Telephone Encounter (Signed)
R/s appt per 12/17 sch message - pt is aware of new appt date and time

## 2018-06-16 IMAGING — DX DG CHEST 2V
2 series · 2 of 2 positions shown · non-contrast
Comparison: None in PACs

CLINICAL DATA: Three days of cough, chest heaviness, intermittent
shortness of breath. History of hypertension and gastroesophageal
reflux. Nonsmoker.

EXAM:
CHEST  2 VIEW

[chest pa]
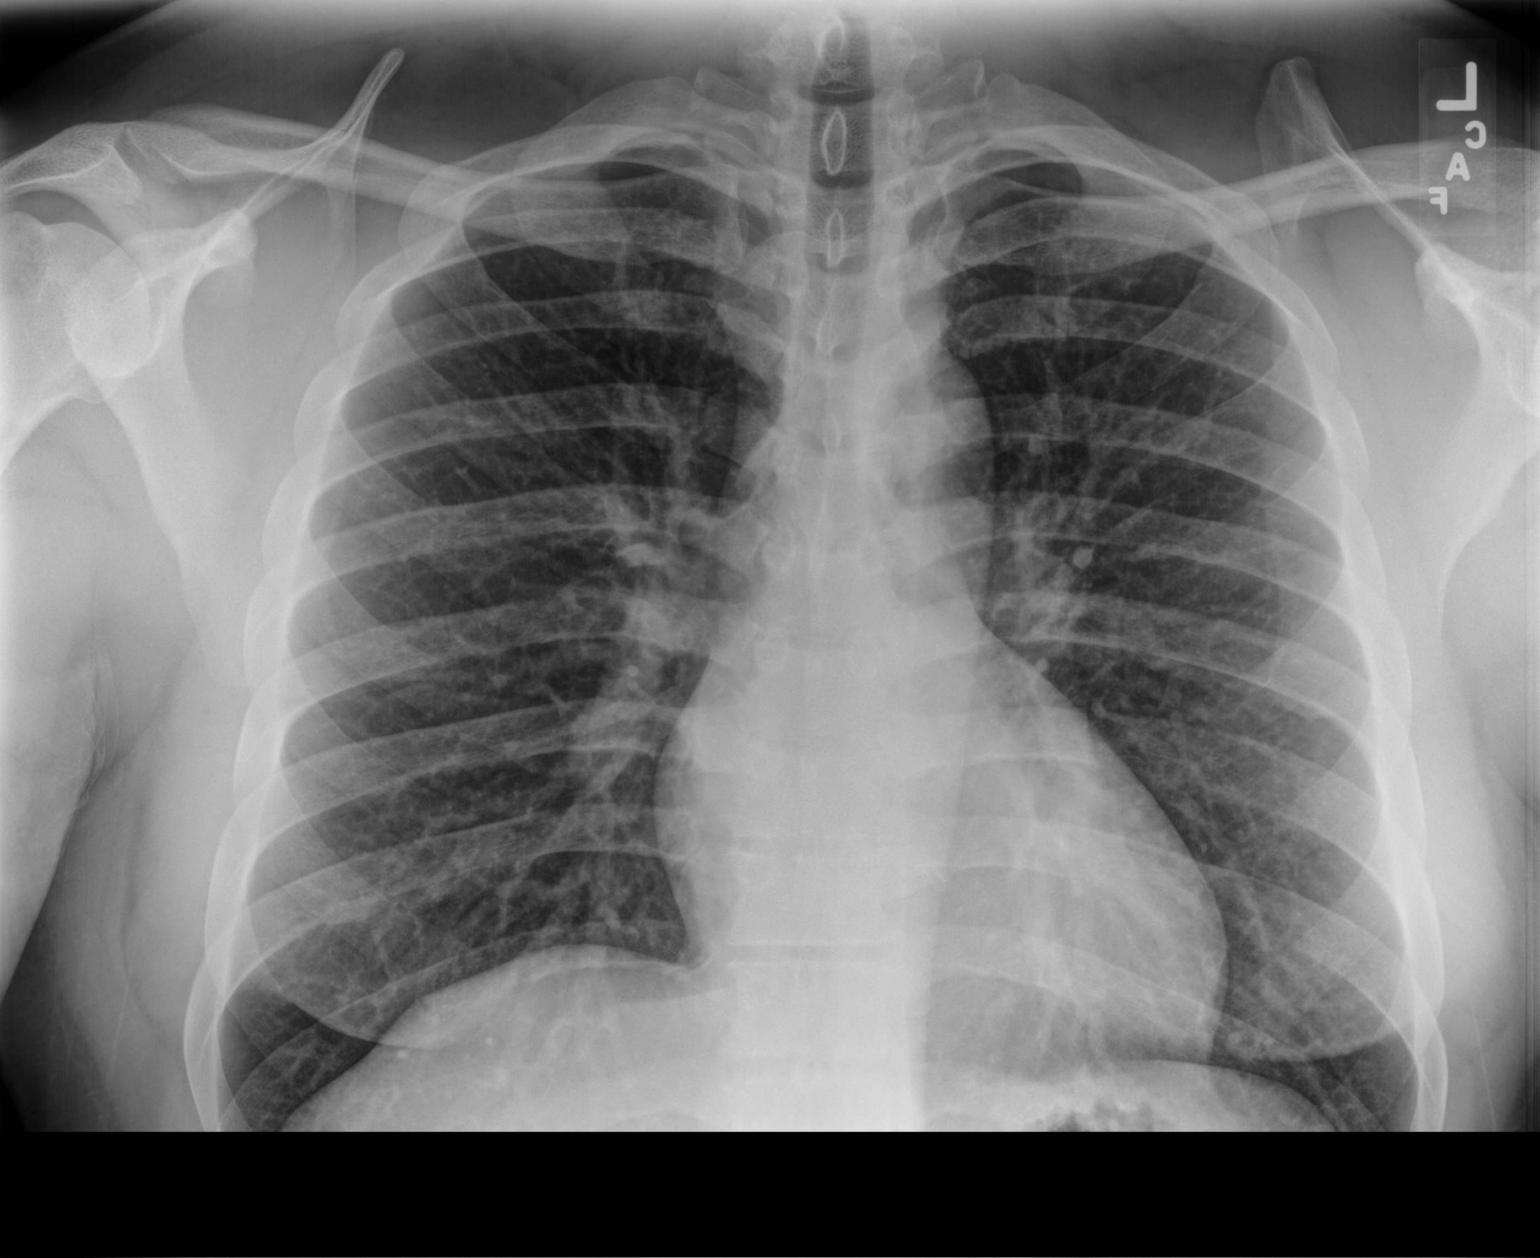

[chest lat]
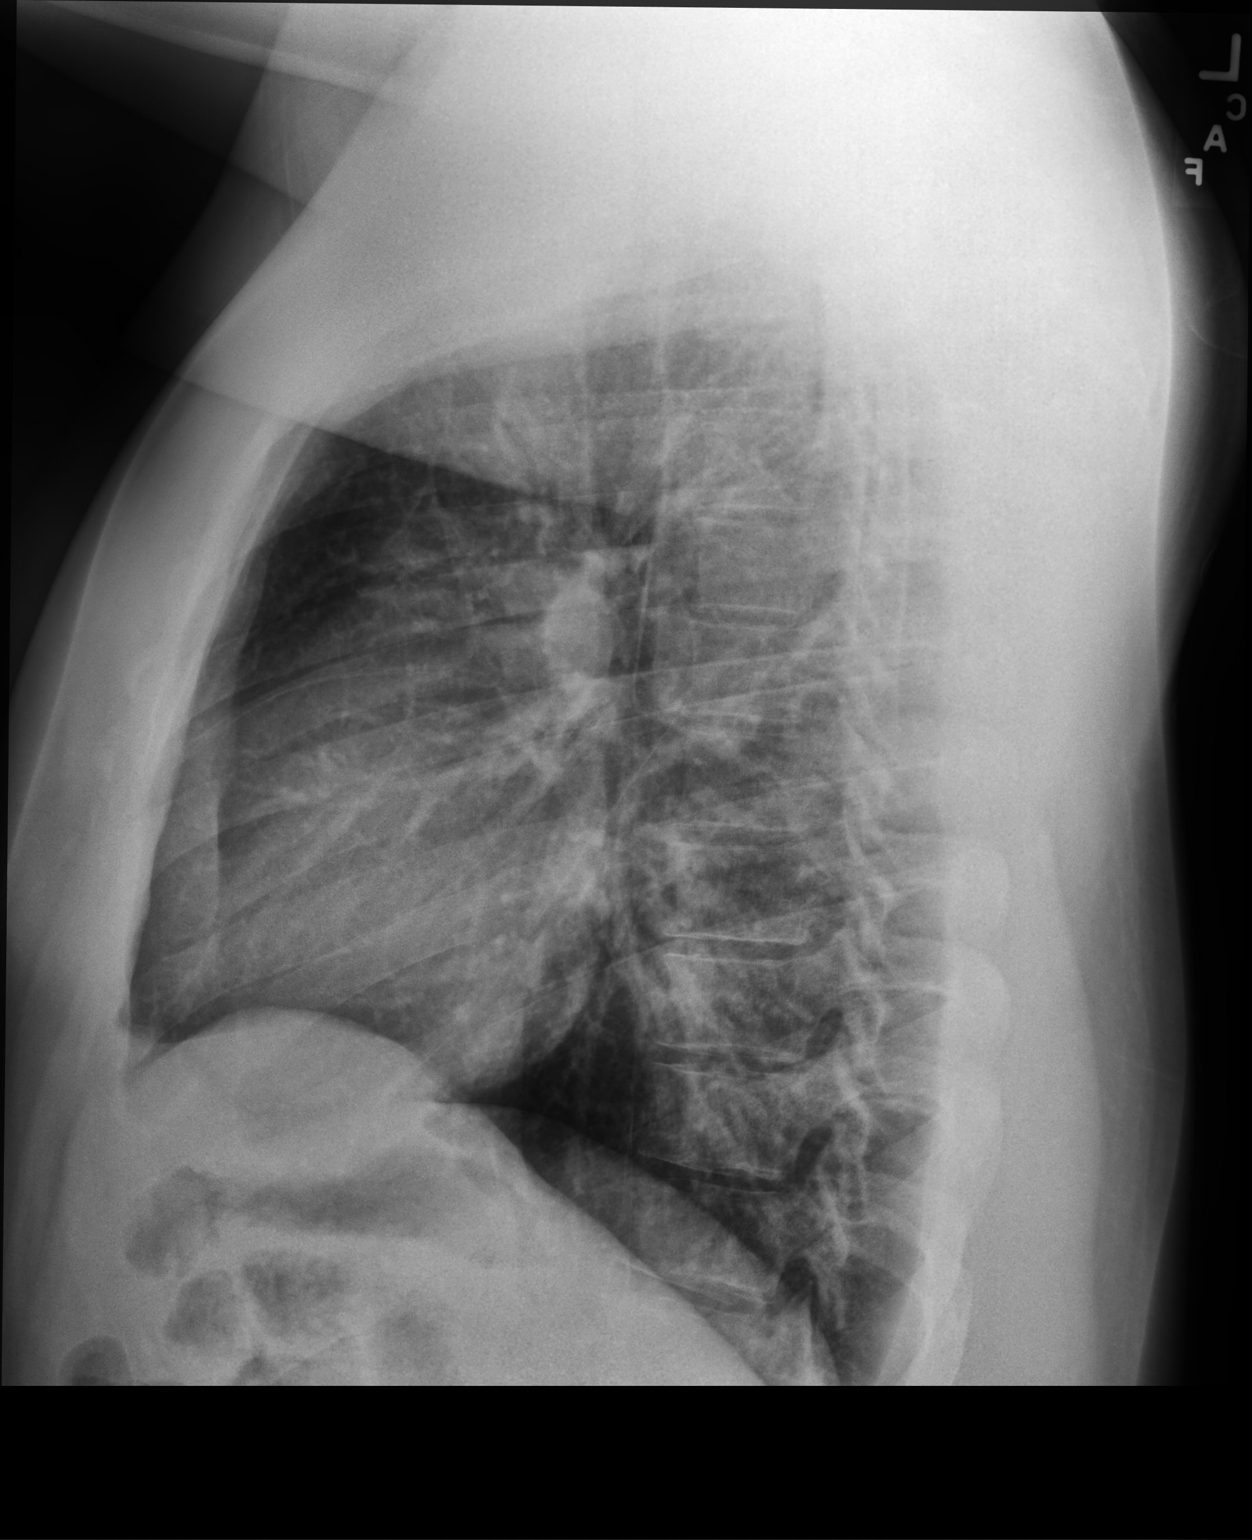

[2 of 2 positions shown; findings below may reference images not displayed]

FINDINGS: The lungs are adequately inflated. There is no focal infiltrate. The
interstitial markings are mildly prominent bilaterally. There is no
pleural effusion. The heart and pulmonary vascularity are normal.
The mediastinum is normal in width. The trachea is midline. The bony
thorax exhibits no acute abnormality.
IMPRESSION: There is no pneumonia nor CHF. Mild interstitial prominence may be
normal for the patient or could reflect acute bronchitis.

## 2018-06-28 ENCOUNTER — Other Ambulatory Visit: Payer: Self-pay | Admitting: Hematology

## 2018-06-28 DIAGNOSIS — D563 Thalassemia minor: Secondary | ICD-10-CM

## 2018-06-28 NOTE — Progress Notes (Signed)
[facility] OFFICE PROGRESS NOTE  Patient Care Team: Ryan Rowe, Ryan Rowe, [place] as PCP - General (Physician Assistant)  HEME/ONC OVERVIEW: 1. Alpha thalassemia minor -Alpha gene analysis (for microcytosis) showed a-/a-, consistent w/ alpha thalassemia minor; no anemia  ASSESSMENT & PLAN:   Alpha thalassemia minor -I reviewed the alpha gene analysis results in detail with the patient, including the genetic inheritance pattern -As the patient does not have any anemia, there is no indication for further work-up or treatment -I encouraged the patient to take folic acid 1mg  daily due to increased RBC turnover -I also discussed with the patient the patient on the role of genetic counseling prior to having a child, as the child may be at risk for developing more severe anemia if the spouse also carries thalassemia gene  All questions were answered. The patient knows to call the clinic with any problems, questions or concerns. No barriers to learning was detected.  A total of more than 15 minutes were spent face-to-face with the patient during this encounter and over half of that time was spent on counseling and coordination of care as outlined above.   Return as needed.   Ryan HolmsYan Agastya Meister, MD 07/03/2018 10:31 AM  CHIEF COMPLAINT: "I am here for test results"  INTERVAL HISTORY: Ryan Rowe returns to clinic for follow-up of recent alpha thalassemia gene testing.  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  He denies any complaint today.  REVIEW OF SYSTEMS:   Constitutional: ( - ) fevers, ( - )  chills , ( - ) night sweats Eyes: ( - ) blurriness of vision, ( - ) double vision, ( - ) watery eyes Ears, nose, mouth, throat, and face: ( - ) mucositis, ( - ) sore throat Respiratory: ( - ) cough, ( - ) dyspnea, ( - ) wheezes Cardiovascular: ( - ) palpitation, ( - ) chest discomfort, ( - ) lower extremity swelling Gastrointestinal:  ( -  ) nausea, ( - ) heartburn, ( - ) change in bowel habits Skin: ( - ) abnormal skin rashes Lymphatics: ( - ) new lymphadenopathy, ( - ) easy bruising Neurological: ( - ) numbness, ( - ) tingling, ( - ) new weaknesses Behavioral/Psych: ( - ) mood change, ( - ) new changes  All other systems were reviewed with the patient and are negative.  I have reviewed the past medical history, past surgical history, social history and family history with the patient and they are unchanged from previous note.  ALLERGIES:  has No Known Allergies.  MEDICATIONS:  Current Outpatient Medications  Medication Sig Dispense Refill  . lisinopril (PRINIVIL,ZESTRIL) 20 MG tablet TAKE 1 TABLET BY MOUTH EVERY DAY 30 tablet 1  . metFORMIN (GLUCOPHAGE) 500 MG tablet TAKE 1 TABLET BY MOUTH DAILY WITH BREAKFAST 90 tablet 0  . albuterol (PROVENTIL HFA;VENTOLIN HFA) 108 (90 Base) MCG/ACT inhaler Inhale 2 puffs into the lungs every 4 (four) hours as needed for wheezing or shortness of breath (cough, shortness of breath or wheezing.). (Patient not taking: Reported on 07/03/2018) 1 Inhaler 6  . Butalbital-APAP-Caffeine 50-300-40 MG CAPS Take 1 capsule by mouth every 6 (six) hours. (Patient not taking: Reported on 07/03/2018) 30 capsule 1  . Fexofenadine HCl (ALLEGRA ALLERGY PO) Take 1 tablet by mouth daily as needed (allergies).    . hydrOXYzine (ATARAX/VISTARIL) 25 MG tablet Take 1 tablet (25 mg total) by mouth at bedtime as needed for anxiety (insomnia). Take one  25 mg tablet as needed for anxiety. May increase to 2 tabs. (Patient not taking: Reported on 07/03/2018) 60 tablet 0  . ranitidine (ZANTAC) 150 MG tablet Take 1 tablet (150 mg total) by mouth 2 (two) times daily. (Patient not taking: Reported on 07/03/2018) 60 tablet 1   No current facility-administered medications for this visit.     PHYSICAL EXAMINATION: ECOG PERFORMANCE STATUS: 0 - Asymptomatic  Today's Vitals   07/03/18 1009 07/03/18 1012  BP: 138/85   Pulse: 75    Resp: 20   Temp: 97.8 F (36.6 C)   TempSrc: Oral   SpO2: 100%   Weight: 245 lb 11.2 oz (111.4 kg)   Height: 5' 10.5" (1.791 m)   PainSc:  3    Body mass index is 34.76 kg/m.  Filed Weights   07/03/18 1009  Weight: 245 lb 11.2 oz (111.4 kg)    GENERAL: alert, no distress and comfortable, well-appearing SKIN: skin color, texture, turgor are normal, no rashes or significant lesions EYES: conjunctiva are pink and non-injected, sclera clear OROPHARYNX: no exudate, no erythema; lips, buccal mucosa, and tongue normal  NECK: supple, non-tender LYMPH:  no palpable lymphadenopathy in the cervical or axillary  LUNGS: clear to auscultation and percussion with normal breathing effort HEART: regular rate & rhythm and no murmurs and no lower extremity edema ABDOMEN: soft, non-tender, non-distended, normal bowel sounds Musculoskeletal: no cyanosis of digits and no clubbing  PSYCH: alert & oriented x 3, fluent speech NEURO: no focal motor/sensory deficits  LABORATORY DATA:  I have reviewed the data as listed    Component Value Date/Time   NA 140 05/01/2018 1034   K 4.1 05/01/2018 1034   CL 105 05/01/2018 1034   CO2 27 05/01/2018 1034   GLUCOSE 99 05/01/2018 1034   BUN 14 05/01/2018 1034   CREATININE 1.17 05/01/2018 1034   CREATININE 1.14 09/22/2015 1235   CALCIUM 10.0 05/01/2018 1034   PROT 8.1 05/01/2018 1034   ALBUMIN 4.4 05/01/2018 1034   AST 18 05/01/2018 1034   ALT 18 05/01/2018 1034   ALKPHOS 54 05/01/2018 1034   BILITOT 0.5 05/01/2018 1034   GFRNONAA >60 05/01/2018 1034   GFRNONAA 86 09/22/2015 1235   GFRAA >60 05/01/2018 1034   GFRAA >89 09/22/2015 1235    No results found for: SPEP, UPEP  Lab Results  Component Value Date   WBC 4.7 07/03/2018   NEUTROABS 2.8 07/03/2018   HGB 14.3 07/03/2018   HCT 46.3 07/03/2018   MCV 70.8 (L) 07/03/2018   PLT 269 07/03/2018      Chemistry      Component Value Date/Time   NA 140 05/01/2018 1034   K 4.1 05/01/2018 1034    CL 105 05/01/2018 1034   CO2 27 05/01/2018 1034   BUN 14 05/01/2018 1034   CREATININE 1.17 05/01/2018 1034   CREATININE 1.14 09/22/2015 1235      Component Value Date/Time   CALCIUM 10.0 05/01/2018 1034   ALKPHOS 54 05/01/2018 1034   AST 18 05/01/2018 1034   ALT 18 05/01/2018 1034   BILITOT 0.5 05/01/2018 1034

## 2018-07-03 ENCOUNTER — Encounter: Payer: Self-pay | Admitting: *Deleted

## 2018-07-03 ENCOUNTER — Inpatient Hospital Stay (HOSPITAL_BASED_OUTPATIENT_CLINIC_OR_DEPARTMENT_OTHER): Payer: BLUE CROSS/BLUE SHIELD | Admitting: Hematology

## 2018-07-03 ENCOUNTER — Encounter: Payer: Self-pay | Admitting: Hematology

## 2018-07-03 ENCOUNTER — Inpatient Hospital Stay: Payer: BLUE CROSS/BLUE SHIELD | Attending: Hematology and Oncology

## 2018-07-03 VITALS — BP 138/85 | HR 75 | Temp 97.8°F | Resp 20 | Ht 70.5 in | Wt 245.7 lb

## 2018-07-03 DIAGNOSIS — Z79899 Other long term (current) drug therapy: Secondary | ICD-10-CM | POA: Diagnosis not present

## 2018-07-03 DIAGNOSIS — D563 Thalassemia minor: Secondary | ICD-10-CM

## 2018-07-03 LAB — CBC WITH DIFFERENTIAL (CANCER CENTER ONLY)
Abs Immature Granulocytes: 0.02 10*3/uL (ref 0.00–0.07)
Basophils Absolute: 0 10*3/uL (ref 0.0–0.1)
Basophils Relative: 1 %
Eosinophils Absolute: 0.1 10*3/uL (ref 0.0–0.5)
Eosinophils Relative: 1 %
HCT: 46.3 % (ref 39.0–52.0)
Hemoglobin: 14.3 g/dL (ref 13.0–17.0)
Immature Granulocytes: 0 %
Lymphocytes Relative: 33 %
Lymphs Abs: 1.6 10*3/uL (ref 0.7–4.0)
MCH: 21.9 pg — ABNORMAL LOW (ref 26.0–34.0)
MCHC: 30.9 g/dL (ref 30.0–36.0)
MCV: 70.8 fL — ABNORMAL LOW (ref 80.0–100.0)
Monocytes Absolute: 0.3 10*3/uL (ref 0.1–1.0)
Monocytes Relative: 7 %
Neutro Abs: 2.8 10*3/uL (ref 1.7–7.7)
Neutrophils Relative %: 58 %
Platelet Count: 269 10*3/uL (ref 150–400)
RBC: 6.54 MIL/uL — ABNORMAL HIGH (ref 4.22–5.81)
RDW: 14.7 % (ref 11.5–15.5)
WBC Count: 4.7 10*3/uL (ref 4.0–10.5)
nRBC: 0 % (ref 0.0–0.2)

## 2018-07-04 ENCOUNTER — Telehealth: Payer: Self-pay | Admitting: Hematology

## 2018-07-04 NOTE — Telephone Encounter (Signed)
Per 01/07 los, return as needed.

## 2018-07-08 ENCOUNTER — Other Ambulatory Visit: Payer: Self-pay | Admitting: Physician Assistant

## 2018-07-13 ENCOUNTER — Ambulatory Visit: Payer: BLUE CROSS/BLUE SHIELD | Admitting: Physician Assistant

## 2018-07-13 ENCOUNTER — Other Ambulatory Visit (HOSPITAL_COMMUNITY)
Admission: RE | Admit: 2018-07-13 | Discharge: 2018-07-13 | Disposition: A | Discharge status: Home / Self Care | Payer: BLUE CROSS/BLUE SHIELD | Source: Ambulatory Visit | Attending: Physician Assistant | Admitting: Physician Assistant

## 2018-07-13 ENCOUNTER — Encounter: Payer: Self-pay | Admitting: Physician Assistant

## 2018-07-13 VITALS — BP 120/82 | HR 71 | Temp 98.8°F | Ht 70.5 in | Wt 245.5 lb

## 2018-07-13 DIAGNOSIS — R300 Dysuria: Secondary | ICD-10-CM | POA: Diagnosis not present

## 2018-07-13 DIAGNOSIS — R350 Frequency of micturition: Secondary | ICD-10-CM | POA: Diagnosis not present

## 2018-07-13 LAB — POCT URINALYSIS DIPSTICK
Bilirubin, UA: NEGATIVE
Blood, UA: NEGATIVE
Glucose, UA: NEGATIVE
Ketones, UA: NEGATIVE
Leukocytes, UA: NEGATIVE
Nitrite, UA: NEGATIVE
Protein, UA: NEGATIVE
Spec Grav, UA: 1.015 (ref 1.010–1.025)
Urobilinogen, UA: 0.2 E.U./dL
pH, UA: 6 (ref 5.0–8.0)

## 2018-07-13 MED ORDER — METRONIDAZOLE 500 MG PO TABS: 500.0000 mg | ORAL_TABLET | Freq: Two times a day (BID) | ORAL | 0 refills | Status: DC

## 2018-07-13 NOTE — Patient Instructions (Signed)
It was great to see you!  Start the flagyl medication.   We will call with results.  Take care,  Jarold MottoSamantha Aeden Matranga PA-C

## 2018-07-13 NOTE — Progress Notes (Signed)
Ryan Rowe is a 33 y.o. male here for a new problem.  I acted as a Neurosurgeon for [employer], PA-C Corky Mull, LPN History of Present Illness:   Chief Complaint  Patient presents with  . Urinary Tract Infection  . Dysuria    Urinary Tract Infection   This is a new problem. Episode onset: Started yesterday, Girlfriend was at the doctor today and was told she has a bacterial infection in her IUD and needs to be removed. Pt did have intercourse this past weekend. The problem occurs every urination. The problem has been gradually worsening. The quality of the pain is described as burning. The pain is at a severity of 3/10. The pain is mild. There has been no fever. He is sexually active. There is no history of pyelonephritis. Associated symptoms include frequency. Pertinent negatives include no chills, discharge, flank pain, hematuria, nausea or urgency. He has tried nothing for the symptoms.   He states that his girlfriend is on diflucan and flagyl.   Past Medical History:  Diagnosis Date  . GERD (gastroesophageal reflux disease)   . Hypertension   . Pre-diabetes      Social History   Socioeconomic History  . Marital status: Single    Spouse name: Not on file  . Number of children: Not on file  . Years of education: Not on file  . Highest education level: Not on file  Occupational History  . Not on file  Social Needs  . Financial resource strain: Not on file  . Food insecurity:    Worry: Not on file    Inability: Not on file  . Transportation needs:    Medical: Not on file    Non-medical: Not on file  Tobacco Use  . Smoking status: Never Smoker  . Smokeless tobacco: Never Used  Substance and Sexual Activity  . Alcohol use: Yes    Comment: Socially  . Drug use: No  . Sexual activity: Yes  Lifestyle  . Physical activity:    Days per week: Not on file    Minutes per session: Not on file  . Stress: Not on file  Relationships  . Social connections:     Talks on phone: Not on file    Gets together: Not on file    Attends religious service: Not on file    Active member of club or organization: Not on file    Attends meetings of clubs or organizations: Not on file    Relationship status: Not on file  . Intimate partner violence:    Fear of current or ex partner: Not on file    Emotionally abused: Not on file    Physically abused: Not on file    Forced sexual activity: Not on file  Other Topics Concern  . 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  Exercise - regularly. Diet - healthy.       Past Surgical History:  Procedure Laterality Date  . FRACTURE SURGERY Right    wrist    Family History  Problem Relation Age of Onset  . Depression Mother   . Diabetes Mother        controlled w/o medication  . Hypertension Father   . Diabetes Other   . Cancer Maternal Aunt        breast  . ALS Maternal Grandfather   . Diabetes Maternal Grandfather   . Cancer  Maternal Aunt        breast  . Heart disease Cousin 50  . Hypertension Paternal Grandmother   . Hypertension Paternal Grandfather   . Stroke Neg Hx     No Known Allergies  Current Medications:   Current Outpatient Medications:  .  albuterol (PROVENTIL HFA;VENTOLIN HFA) 108 (90 Base) MCG/ACT inhaler, Inhale 2 puffs into the lungs every 4 (four) hours as needed for wheezing or shortness of breath (cough, shortness of breath or wheezing.)., Disp: 1 Inhaler, Rfl: 6 .  Butalbital-APAP-Caffeine 50-300-40 MG CAPS, Take 1 capsule by mouth every 6 (six) hours., Disp: 30 capsule, Rfl: 1 .  Fexofenadine HCl (ALLEGRA ALLERGY PO), Take 1 tablet by mouth daily as needed (allergies)., Disp: , Rfl:  .  hydrOXYzine (ATARAX/VISTARIL) 25 MG tablet, Take 1 tablet (25 mg total) by mouth at bedtime as needed for anxiety (insomnia). Take one 25 mg tablet as needed for anxiety. May increase to 2 tabs., Disp: 60 tablet, Rfl:  0 .  lisinopril (PRINIVIL,ZESTRIL) 20 MG tablet, TAKE 1 TABLET BY MOUTH EVERY DAY, Disp: 30 tablet, Rfl: 1 .  metFORMIN (GLUCOPHAGE) 500 MG tablet, TAKE 1 TABLET BY MOUTH DAILY WITH BREAKFAST, Disp: 90 tablet, Rfl: 0 .  ranitidine (ZANTAC) 150 MG tablet, Take 1 tablet (150 mg total) by mouth 2 (two) times daily., Disp: 60 tablet, Rfl: 1 .  metroNIDAZOLE (FLAGYL) 500 MG tablet, Take 1 tablet (500 mg total) by mouth 2 (two) times daily., Disp: 21 tablet, Rfl: 0   Review of Systems:   Review of Systems  Constitutional: Negative for chills.  Gastrointestinal: Negative for nausea.  Genitourinary: Positive for frequency. Negative for flank pain, hematuria and urgency.    Vitals:   Vitals:   07/13/18 1155  BP: 120/82  Pulse: 71  Temp: 98.8 F (37.1 C)  TempSrc: Oral  SpO2: 98%  Weight: 245 lb 8 oz (111.4 kg)  Height: 5' 10.5" (1.791 m)     Body mass index is 34.73 kg/m.  Physical Exam:   Physical Exam Vitals signs and nursing note reviewed.  Constitutional:      General: He is not in acute distress.    Appearance: He is well-developed. He is not ill-appearing or toxic-appearing.  Cardiovascular:     Rate and Rhythm: Normal rate and regular rhythm.     Pulses: Normal pulses.     Heart sounds: Normal heart sounds, S1 normal and S2 normal.     Comments: No LE edema Pulmonary:     Effort: Pulmonary effort is normal.     Breath sounds: Normal breath sounds.  Skin:    General: Skin is warm and dry.  Neurological:     Mental Status: He is alert.     GCS: GCS eye subscore is 4. GCS verbal subscore is 5. GCS motor subscore is 6.  Psychiatric:        Speech: Speech normal.        Behavior: Behavior normal. Behavior is cooperative.    Declined prostate exam.  Results for orders placed or performed in visit on 07/13/18  POCT urinalysis dipstick  Result Value Ref Range   Color, UA yellow    Clarity, UA clear    Glucose, UA Negative Negative   Bilirubin, UA Negative     Ketones, UA Negative    Spec Grav, UA 1.015 1.010 - 1.025   Blood, UA Negative    pH, UA 6.0 5.0 - 8.0   Protein, UA Negative Negative  Urobilinogen, UA 0.2 0.2 or 1.0 E.U./dL   Nitrite, UA Negative    Leukocytes, UA Negative Negative   Appearance     Odor       Assessment and Plan:   Ryan Rowe was seen today for urinary tract infection and dysuria.  Diagnoses and all orders for this visit:  Dysuria and Frequency of urination No red flags on exam. UA negative. Declined DRE. Discussed waiting for results to treat however he would like to treat for "possible infection that my girlfriend has". Will provide patient with flagyl prescription, advised to not drink with this. Will send UA for culture and also check urine cytology. -     POCT urinalysis dipstick -     Urine cytology ancillary only -     Urine Culture  Other orders -     metroNIDAZOLE (FLAGYL) 500 MG tablet; Take 1 tablet (500 mg total) by mouth 2 (two) times daily.   . Reviewed expectations re: course of current medical issues. . Discussed self-management of symptoms. . Outlined signs and symptoms indicating need for more acute intervention. . Patient verbalized understanding and all questions were answered. . See orders for this visit as documented in the electronic medical record. . Patient received an After-Visit Summary.  CMA or LPN served as scribe during this visit. History, Physical, and Plan performed by medical provider. The above documentation has been reviewed and is accurate and complete.   Jarold MottoSamantha Oliveah Zwack, PA-C

## 2018-07-14 LAB — URINE CULTURE
MICRO NUMBER:: 71189
Result:: NO GROWTH
SPECIMEN QUALITY:: ADEQUATE

## 2018-07-16 LAB — URINE CYTOLOGY ANCILLARY ONLY
Chlamydia: NEGATIVE
Neisseria Gonorrhea: NEGATIVE
Trichomonas: NEGATIVE

## 2018-07-18 ENCOUNTER — Encounter: Payer: Self-pay | Admitting: Physician Assistant

## 2018-07-18 ENCOUNTER — Ambulatory Visit: Payer: BLUE CROSS/BLUE SHIELD | Admitting: Physician Assistant

## 2018-07-18 VITALS — BP 120/82 | HR 67 | Temp 98.6°F | Ht 70.5 in | Wt 246.4 lb

## 2018-07-18 DIAGNOSIS — N4889 Other specified disorders of penis: Secondary | ICD-10-CM

## 2018-07-18 DIAGNOSIS — R310 Gross hematuria: Secondary | ICD-10-CM

## 2018-07-18 LAB — CBC WITH DIFFERENTIAL/PLATELET
Basophils Absolute: 0 10*3/uL (ref 0.0–0.1)
Basophils Relative: 1.1 % (ref 0.0–3.0)
Eosinophils Absolute: 0.1 10*3/uL (ref 0.0–0.7)
Eosinophils Relative: 1.7 % (ref 0.0–5.0)
HCT: 43.1 % (ref 39.0–52.0)
Hemoglobin: 13.9 g/dL (ref 13.0–17.0)
Lymphocytes Relative: 40.9 % (ref 12.0–46.0)
Lymphs Abs: 1.5 10*3/uL (ref 0.7–4.0)
MCHC: 32.2 g/dL (ref 30.0–36.0)
MCV: 70.3 fl — ABNORMAL LOW (ref 78.0–100.0)
Monocytes Absolute: 0.4 10*3/uL (ref 0.1–1.0)
Monocytes Relative: 12.4 % — ABNORMAL HIGH (ref 3.0–12.0)
Neutro Abs: 1.6 10*3/uL (ref 1.4–7.7)
Neutrophils Relative %: 43.9 % (ref 43.0–77.0)
Platelets: 279 10*3/uL (ref 150.0–400.0)
RBC: 6.13 Mil/uL — ABNORMAL HIGH (ref 4.22–5.81)
RDW: 14.9 % (ref 11.5–15.5)
WBC: 3.6 10*3/uL — ABNORMAL LOW (ref 4.0–10.5)

## 2018-07-18 LAB — URINALYSIS, ROUTINE W REFLEX MICROSCOPIC
Bilirubin Urine: NEGATIVE
Hgb urine dipstick: NEGATIVE
Ketones, ur: NEGATIVE
Leukocytes, UA: NEGATIVE
Nitrite: NEGATIVE
RBC / HPF: NONE SEEN (ref 0–?)
Specific Gravity, Urine: 1.015 (ref 1.000–1.030)
Total Protein, Urine: NEGATIVE
Urine Glucose: NEGATIVE
Urobilinogen, UA: 0.2 (ref 0.0–1.0)
pH: 6.5 (ref 5.0–8.0)

## 2018-07-18 LAB — BASIC METABOLIC PANEL
BUN: 14 mg/dL (ref 6–23)
CO2: 31 mEq/L (ref 19–32)
Calcium: 9.4 mg/dL (ref 8.4–10.5)
Chloride: 100 mEq/L (ref 96–112)
Creatinine, Ser: 1.12 mg/dL (ref 0.40–1.50)
GFR: 91.35 mL/min (ref >60.00)
Glucose, Bld: 108 mg/dL — ABNORMAL HIGH (ref 70–99)
Potassium: 4.4 mEq/L (ref 3.5–5.1)
Sodium: 137 mEq/L (ref 135–145)

## 2018-07-18 LAB — URINE CYTOLOGY ANCILLARY ONLY
Bacterial vaginitis: NEGATIVE
Candida vaginitis: NEGATIVE

## 2018-07-18 LAB — URIC ACID: Uric Acid, Serum: 6.1 mg/dL (ref 4.0–7.8)

## 2018-07-18 LAB — PSA: PSA: 0.55 ng/mL (ref 0.10–4.00)

## 2018-07-18 MED ORDER — TAMSULOSIN HCL 0.4 MG PO CAPS: 0.4000 mg | ORAL_CAPSULE | Freq: Every day | ORAL | 0 refills | Status: DC

## 2018-07-18 NOTE — Progress Notes (Signed)
Ryan Rowe is a 33 y.o. male is here to follow up on urine results due to Hematuria over the weekend.  I acted as a Neurosurgeonscribe for [employer]antha Niyam Bisping, PA-C Corky Mullonna Orphanos, LPN  History of Present Illness:   Chief Complaint  Patient presents with  . Hematuria    Hematuria  This is a new problem. Episode onset: Pt had one episode of blood in urine on Sunday.  The problem has been resolved since onset. He describes the hematuria as gross hematuria. The hematuria occurs during the initial portion of his urinary stream. He reports no clotting in his urine stream. His pain is at a severity of 5/10. The pain is moderate. He describes his urine color as yellow. Irritative symptoms do not include frequency, nocturia or urgency. Obstructive symptoms include an intermittent stream and a slower stream. Obstructive symptoms do not include dribbling, incomplete emptying, straining or a weak stream. Associated symptoms include dysuria. Pertinent negatives include no abdominal pain, chills, fever, flank pain, inability to urinate, nausea or vomiting. (Tip of penis is tender) He is sexually active. His past medical history is significant for hypertension. There is no history of STDs.   Patient was seen on 07/13/18 and the following was performed: Results for orders placed or performed in visit on 07/13/18  Urine Culture  Result Value Ref Range   MICRO NUMBER: [id number]    SPECIMEN QUALITY: Adequate    Sample Source URINE    STATUS: FINAL    Result: No Growth   POCT urinalysis dipstick  Result Value Ref Range   Color, UA yellow    Clarity, UA clear    Glucose, UA Negative Negative   Bilirubin, UA Negative    Ketones, UA Negative    Spec Grav, UA 1.015 1.010 - 1.025   Blood, UA Negative    pH, UA 6.0 5.0 - 8.0   Protein, UA Negative Negative   Urobilinogen, UA 0.2 0.2 or 1.0 E.U./dL   Nitrite, UA Negative    Leukocytes, UA Negative Negative   Appearance     Odor    Urine cytology ancillary only   Result Value Ref Range   Chlamydia Negative    Neisseria gonorrhea Negative    Trichomonas Negative   Urine cytology ancillary only  Result Value Ref Range   Bacterial vaginitis Negative for Bacterial Vaginitis Microorganisms    Candida vaginitis Negative for Candida Vaginitis Microorganisms      There are no preventive care reminders to display for this patient.  Past Medical History:  Diagnosis Date  . GERD (gastroesophageal reflux disease)   . Hypertension   . Pre-diabetes      Social History   Socioeconomic History  . Marital status: Single    Spouse name: Not on file  . Number of children: Not on file  . Years of education: Not on file  . Highest education level: Not on file  Occupational History  . Not on file  Social Needs  . Financial resource strain: Not on file  . Food insecurity:    Worry: Not on file    Inability: Not on file  . Transportation needs:    Medical: Not on file    Non-medical: Not on file  Tobacco Use  . Smoking status: Never Smoker  . Smokeless tobacco: Never Used  Substance and Sexual Activity  . Alcohol use: Yes    Comment: Socially  . Drug use: No  . Sexual activity: Yes  Lifestyle  .  Physical activity:    Days per week: Not on file    Minutes per session: Not on file  . Stress: Not on file  Relationships  . Social connections:    Talks on phone: Not on file    Gets together: Not on file    Attends religious service: Not on file    Active member of club or organization: Not on file    Attends meetings of clubs or organizations: Not on file    Relationship status: Not on file  . Intimate partner violence:    Fear of current or ex partner: Not on file    Emotionally abused: Not on file    Physically abused: Not on file    Forced sexual activity: Not on file  Other Topics Concern  . 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  Exercise - regularly.  Diet - healthy.       Past Surgical History:  Procedure Laterality Date  . FRACTURE SURGERY Right    wrist    Family History  Problem Relation Age of Onset  . Depression Mother   . Diabetes Mother        controlled w/o medication  . Hypertension Father   . Diabetes Other   . Cancer Maternal Aunt        breast  . ALS Maternal Grandfather   . Diabetes Maternal Grandfather   . Cancer Maternal Aunt        breast  . Heart disease Cousin 50  . Hypertension Paternal Grandmother   . Hypertension Paternal Grandfather   . Stroke Neg Hx     PMHx, SurgHx, SocialHx, FamHx, Medications, and Allergies were reviewed in the Visit Navigator and updated as appropriate.   Patient Active Problem List   Diagnosis Date Noted  . Alpha thalassemia minor 05/01/2018  . Elevated red blood cell count 05/01/2018  . Vitamin D deficiency 04/16/2018  . Essential hypertension 12/27/2017  . Bilateral impacted cerumen 12/27/2017  . Mild intermittent asthma without complication 09/29/2016  . Pre-diabetes 09/29/2016  . Obesity (BMI 35.0-39.9 without comorbidity) 09/29/2016  . GERD (gastroesophageal reflux disease) 02/21/2015    Social History   Tobacco Use  . Smoking status: Never Smoker  . Smokeless tobacco: Never Used  Substance Use Topics  . Alcohol use: Yes    Comment: Socially  . Drug use: No    Current Medications and Allergies:    Current Outpatient Medications:  .  albuterol (PROVENTIL HFA;VENTOLIN HFA) 108 (90 Base) MCG/ACT inhaler, Inhale 2 puffs into the lungs every 4 (four) hours as needed for wheezing or shortness of breath (cough, shortness of breath or wheezing.)., Disp: 1 Inhaler, Rfl: 6 .  Butalbital-APAP-Caffeine 50-300-40 MG CAPS, Take 1 capsule by mouth every 6 (six) hours., Disp: 30 capsule, Rfl: 1 .  Fexofenadine HCl (ALLEGRA ALLERGY PO), Take 1 tablet by mouth daily as needed (allergies)., Disp: , Rfl:  .  hydrOXYzine (ATARAX/VISTARIL) 25 MG tablet, Take 1 tablet (25 mg  total) by mouth at bedtime as needed for anxiety (insomnia). Take one 25 mg tablet as needed for anxiety. May increase to 2 tabs., Disp: 60 tablet, Rfl: 0 .  lisinopril (PRINIVIL,ZESTRIL) 20 MG tablet, TAKE 1 TABLET BY MOUTH EVERY DAY, Disp: 30 tablet, Rfl: 1 .  metFORMIN (GLUCOPHAGE) 500 MG tablet, TAKE 1 TABLET BY MOUTH DAILY WITH BREAKFAST, Disp: 90 tablet, Rfl: 0 .  metroNIDAZOLE (FLAGYL) 500  MG tablet, Take 1 tablet (500 mg total) by mouth 2 (two) times daily. (Patient not taking: Reported on 07/18/2018), Disp: 21 tablet, Rfl: 0 .  tamsulosin (FLOMAX) 0.4 MG CAPS capsule, Take 1 capsule (0.4 mg total) by mouth daily., Disp: 30 capsule, Rfl: 0  No Known Allergies  Review of Systems   Review of Systems  Constitutional: Negative for chills and fever.  Gastrointestinal: Negative for abdominal pain, nausea and vomiting.  Genitourinary: Positive for dysuria and hematuria. Negative for flank pain, frequency, incomplete emptying, nocturia and urgency.    Vitals:   Vitals:   07/18/18 0747  BP: 120/82  Pulse: 67  Temp: 98.6 F (37 C)  TempSrc: Oral  SpO2: 98%  Weight: 246 lb 6.1 oz (111.8 kg)  Height: 5' 10.5" (1.791 m)     Body mass index is 34.85 kg/m.   Physical Exam:    Physical Exam Vitals signs and nursing note reviewed. Exam conducted with a chaperone present.  Constitutional:      Appearance: He is well-developed.  HENT:     Head: Normocephalic.  Eyes:     Conjunctiva/sclera: Conjunctivae normal.     Pupils: Pupils are equal, round, and reactive to light.  Neck:     Musculoskeletal: Normal range of motion.  Pulmonary:     Effort: Pulmonary effort is normal.  Genitourinary:    Comments: Slight erythema to urethral meatus; no tenderness to palpation of penis or scrotum Musculoskeletal: Normal range of motion.  Skin:    General: Skin is warm and dry.  Neurological:     Mental Status: He is alert and oriented to person, place, and time.  Psychiatric:         Behavior: Behavior normal.        Thought Content: Thought content normal.        Judgment: Judgment normal.      Assessment and Plan:    Ryan Rowe was seen today for hematuria.  Diagnoses and all orders for this visit:  Gross hematuria; Penile pain Last visit's urine studies were all normal. Exam normal today. Unclear etiology of symptoms. We will trial flomax to see if that helps with his urine stream, in case he is passing a stone. Referral to urology for further work-up if symptoms persist. Will also check the following labs: -     PSA -     Urinalysis, Routine w reflex microscopic -     Urine Culture -     Uric acid -     Basic metabolic panel -     CBC with Differential/Platelet  Other orders -     tamsulosin (FLOMAX) 0.4 MG CAPS capsule; Take 1 capsule (0.4 mg total) by mouth daily.  . Reviewed expectations re: course of current medical issues. . Discussed self-management of symptoms. . Outlined signs and symptoms indicating need for more acute intervention. . Patient verbalized understanding and all questions were answered. . See orders for this visit as documented in the electronic medical record. . Patient received an After Visit Summary.  CMA or LPN served as scribe during this visit. History, Physical, and Plan performed by medical provider. The above documentation has been reviewed and is accurate and complete.  Jarold Motto, PA-C [place], [place] 07/18/2018  Follow-up: No follow-ups on file.

## 2018-07-18 NOTE — Patient Instructions (Signed)
It was great to see you!  May apply vaseline to the area of discomfort. Avoid sex until discomfort resolves.  Start Flomax daily. This will help you pass urine more effectively.  I will be in touch with your urine results.  If you have worsening symptoms, let me know.  I have put in a referral to urology.   Take care,  Jarold Motto PA-C

## 2018-07-19 LAB — URINE CULTURE
MICRO NUMBER:: 89647
Result:: NO GROWTH
SPECIMEN QUALITY:: ADEQUATE

## 2018-07-27 DIAGNOSIS — L02211 Cutaneous abscess of abdominal wall: Secondary | ICD-10-CM | POA: Diagnosis not present

## 2018-07-27 DIAGNOSIS — R030 Elevated blood-pressure reading, without diagnosis of hypertension: Secondary | ICD-10-CM | POA: Diagnosis not present

## 2018-08-22 ENCOUNTER — Ambulatory Visit: Payer: BLUE CROSS/BLUE SHIELD | Admitting: Physician Assistant

## 2018-09-06 DIAGNOSIS — R102 Pelvic and perineal pain: Secondary | ICD-10-CM | POA: Diagnosis not present

## 2018-09-06 DIAGNOSIS — R310 Gross hematuria: Secondary | ICD-10-CM | POA: Diagnosis not present

## 2018-09-08 ENCOUNTER — Other Ambulatory Visit: Payer: Self-pay | Admitting: Physician Assistant

## 2018-09-10 ENCOUNTER — Other Ambulatory Visit: Payer: Self-pay | Admitting: Physician Assistant

## 2018-09-23 ENCOUNTER — Encounter: Payer: Self-pay | Admitting: Physician Assistant

## 2018-09-24 ENCOUNTER — Other Ambulatory Visit: Payer: Self-pay

## 2018-09-24 ENCOUNTER — Encounter: Payer: Self-pay | Admitting: Physician Assistant

## 2018-09-24 ENCOUNTER — Ambulatory Visit (INDEPENDENT_AMBULATORY_CARE_PROVIDER_SITE_OTHER): Payer: BLUE CROSS/BLUE SHIELD | Admitting: Physician Assistant

## 2018-09-24 DIAGNOSIS — R21 Rash and other nonspecific skin eruption: Secondary | ICD-10-CM

## 2018-09-24 NOTE — Progress Notes (Signed)
Virtual Visit via Video   I connected with Ryan Rowe on 09/24/18 at  8:00 AM EDT by a video enabled telemedicine application and verified that I am speaking with the correct person using two identifiers. Location patient: Home Location provider: [employer], Office Persons participating in the virtual visit: [place], [place], [place], Jarold Motto, [place]   I discussed the limitations of evaluation and management by telemedicine and the availability of in person appointments. The patient expressed understanding and agreed to proceed.  Interactive audio and video telecommunications were attempted between this provider and patient, however failed, due to having technical difficulties. We continued and completed visit with audio only.  Subjective:   HPI: Rash Pt c/o rash on penis, noticed on Saturday night after having intercourse with his girlfriend. He has had these flesh like bumps that are clustered together which he had before with her after having intercourse, but they went away fairly quickly. Denies pain but  they feel a little uncomfortable. He noticed that they did pop and leaked a little fluid. He denies discharge from his penis, burning with urination, fever.   ROS: See pertinent positives and negatives per HPI.  Patient Active Problem List   Diagnosis Date Noted  . Alpha thalassemia minor 05/01/2018  . Elevated red blood cell count 05/01/2018  . Vitamin D deficiency 04/16/2018  . Essential hypertension 12/27/2017  . Bilateral impacted cerumen 12/27/2017  . Mild intermittent asthma without complication 09/29/2016  . Pre-diabetes 09/29/2016  . Obesity (BMI 35.0-39.9 without comorbidity) 09/29/2016  . GERD (gastroesophageal reflux disease) 02/21/2015    Social History   Tobacco Use  . Smoking status: Never Smoker  . Smokeless tobacco: Never Used  Substance Use Topics  . Alcohol use: Yes    Comment: Socially    Current Outpatient Medications:   .  albuterol (PROVENTIL HFA;VENTOLIN HFA) 108 (90 Base) MCG/ACT inhaler, Inhale 2 puffs into the lungs every 4 (four) hours as needed for wheezing or shortness of breath (cough, shortness of breath or wheezing.)., Disp: 1 Inhaler, Rfl: 6 .  Butalbital-APAP-Caffeine 50-300-40 MG CAPS, Take 1 capsule by mouth every 6 (six) hours., Disp: 30 capsule, Rfl: 1 .  Fexofenadine HCl (ALLEGRA ALLERGY PO), Take 1 tablet by mouth daily as needed (allergies)., Disp: , Rfl:  .  hydrOXYzine (ATARAX/VISTARIL) 25 MG tablet, Take 1 tablet (25 mg total) by mouth at bedtime as needed for anxiety (insomnia). Take one 25 mg tablet as needed for anxiety. May increase to 2 tabs., Disp: 60 tablet, Rfl: 0 .  lisinopril (PRINIVIL,ZESTRIL) 20 MG tablet, TAKE 1 TABLET BY MOUTH EVERY DAY, Disp: 90 tablet, Rfl: 0 .  metFORMIN (GLUCOPHAGE) 500 MG tablet, TAKE 1 TABLET BY MOUTH DAILY WITH BREAKFAST, Disp: 90 tablet, Rfl: 0 .  tamsulosin (FLOMAX) 0.4 MG CAPS capsule, Take 1 capsule (0.4 mg total) by mouth daily., Disp: 30 capsule, Rfl: 0  No Known Allergies  Objective:   VITALS: Per patient if applicable, see vitals. GENERAL: Alert, appears well and in no acute distress. HEENT: Atraumatic, conjunctiva clear, no obvious abnormalities on inspection of external nose and ears. NECK: Normal movements of the head and neck. CARDIOPULMONARY: No increased WOB. Speaking in clear sentences. I:E ratio WNL.  MS: Moves all visible extremities without noticeable abnormality. PSYCH: Pleasant and cooperative, well-groomed. Speech normal rate and rhythm. Affect is appropriate. Insight and judgement are appropriate. Attention is focused, linear, and appropriate.  NEURO: CN grossly intact. Oriented as arrived to appointment on time with  no prompting. Moves both UE equally.  SKIN: No obvious lesions, wounds, erythema, or cyanosis noted on face or hands. Per picture --> clustered flesh colored vesicles at base      Assessment and Plan:   Donzell  was seen today for bumps on penis.  Diagnoses and all orders for this visit:  Rash and nonspecific skin eruption   Unclear etiology. He denies any pain or burning -- doubt herpetic etiology. Possible skin irritant or sebaceous gland irritation.  Trial OTC hydrocortisone cream to area. If no improvement, will refer to urology.   . Reviewed expectations re: course of current medical issues. . Discussed self-management of symptoms. . Outlined signs and symptoms indicating need for more acute intervention. . Patient verbalized understanding and all questions were answered. Marland Kitchen Health Maintenance issues including appropriate healthy diet, exercise, and smoking avoidance were discussed with patient. . See orders for this visit as documented in the electronic medical record.  [place], [place] 09/24/2018

## 2018-12-06 ENCOUNTER — Other Ambulatory Visit: Payer: Self-pay | Admitting: Physician Assistant

## 2018-12-12 ENCOUNTER — Other Ambulatory Visit: Payer: Self-pay | Admitting: Physician Assistant

## 2018-12-13 ENCOUNTER — Other Ambulatory Visit: Payer: Self-pay | Admitting: *Deleted

## 2018-12-13 ENCOUNTER — Encounter: Payer: Self-pay | Admitting: Physician Assistant

## 2018-12-13 DIAGNOSIS — R21 Rash and other nonspecific skin eruption: Secondary | ICD-10-CM

## 2018-12-13 NOTE — Progress Notes (Signed)
Spoke to pt, told him Ryan Rowe said I can put referral in for Urology no need for appt due to same problem. Pt verbalized understanding. Told pt someone will contact you about an appt. Pt verbalized understanding. Referral put in Epic.

## 2018-12-14 ENCOUNTER — Ambulatory Visit: Payer: BLUE CROSS/BLUE SHIELD | Admitting: Physician Assistant

## 2019-01-16 ENCOUNTER — Ambulatory Visit (INDEPENDENT_AMBULATORY_CARE_PROVIDER_SITE_OTHER): Payer: BC Managed Care – PPO | Admitting: Physician Assistant

## 2019-01-16 ENCOUNTER — Encounter: Payer: Self-pay | Admitting: Physician Assistant

## 2019-01-16 ENCOUNTER — Other Ambulatory Visit: Payer: Self-pay

## 2019-01-16 VITALS — BP 130/90 | HR 77 | Temp 98.5°F | Ht 70.5 in | Wt 259.0 lb

## 2019-01-16 DIAGNOSIS — R7303 Prediabetes: Secondary | ICD-10-CM

## 2019-01-16 DIAGNOSIS — I10 Essential (primary) hypertension: Secondary | ICD-10-CM

## 2019-01-16 LAB — POCT GLYCOSYLATED HEMOGLOBIN (HGB A1C): Hemoglobin A1C: 6 % — AB (ref 4.0–5.6)

## 2019-01-16 NOTE — Patient Instructions (Signed)
It was great to see you!  Continue your current medication regimen.  Work on getting down to 245 lb!  Keep checking your blood pressure at work. If it doesn't get under 140/90 in a week or so, I need to know.  Let's follow-up in 3 months, sooner if you have concerns.  Take care,  Inda Coke PA-C

## 2019-01-16 NOTE — Progress Notes (Signed)
Ryan Rowe is a 33 y.o. male is here to follow up on Hypertension and Pre-diabetes.  I acted as a Neurosurgeonscribe for Energy East CorporationSamantha Lucylle Foulkes, PA-C Corky Mullonna Orphanos, LPN  History of Present Illness:   Chief Complaint  Patient presents with  . Hypertension  . f/u on Pre-Diabetes    HPI   Hypertension Pt following up today. Currently taking Lisinopril 20 mg daily -- which he just restarted yesterday, he had not been taking for several weeks. Pt has not been checking blood pressure and was only taking medication 3 times a week. Pt had headache yesterday and some dizziness check pressure 147/97 then took medication. Slight headache today no dizziness. Denies SOB, Chest pain or LLE.   Pre-Diabetes Pt following up today. Currently taking Metformin 500 mg daily. Pt has not been taking medication started back on this past week. Has gained about 15 lb since COVID-19 started. Eating more junk food and has increased his alcohol a bit. Exercising less but working on getting back into the gym.   There are no preventive care reminders to display for this patient.  Past Medical History:  Diagnosis Date  . GERD (gastroesophageal reflux disease)   . Hypertension   . Pre-diabetes      Social History   Socioeconomic History  . Marital status: Single    Spouse name: Not on file  . Number of children: Not on file  . Years of education: Not on file  . Highest education level: Not on file  Occupational History  . Not on file  Social Needs  . Financial resource strain: Not on file  . Food insecurity    Worry: Not on file    Inability: Not on file  . Transportation needs    Medical: Not on file    Non-medical: Not on file  Tobacco Use  . Smoking status: Never Smoker  . Smokeless tobacco: Never Used  Substance and Sexual Activity  . Alcohol use: Yes    Comment: Socially  . Drug use: No  . Sexual activity: Yes  Lifestyle  . Physical activity    Days per week: Not on file    Minutes per  session: Not on file  . Stress: Not on file  Relationships  . Social Musicianconnections    Talks on phone: Not on file    Gets together: Not on file    Attends religious service: Not on file    Active member of club or organization: Not on file    Attends meetings of clubs or organizations: Not on file    Relationship status: Not on file  . Intimate partner violence    Fear of current or ex partner: Not on file    Emotionally abused: Not on file    Physically abused: Not on file    Forced sexual activity: Not on file  Other Topics Concern  . 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  Single, no children.  Exercise - regularly. Diet - healthy.       Past Surgical History:  Procedure Laterality Date  . FRACTURE SURGERY Right    wrist    Family History  Problem Relation Age of Onset  . Depression Mother   . Diabetes Mother        controlled w/o medication  . Hypertension Father   . Diabetes Other   . Cancer Maternal Aunt        breast  .  ALS Maternal Grandfather   . Diabetes Maternal Grandfather   . Cancer Maternal Aunt        breast  . Heart disease Cousin 61  . Hypertension Paternal Grandmother   . Hypertension Paternal Grandfather   . Stroke Neg Hx     PMHx, SurgHx, SocialHx, FamHx, Medications, and Allergies were reviewed in the Visit Navigator and updated as appropriate.   Patient Active Problem List   Diagnosis Date Noted  . Alpha thalassemia minor 05/01/2018  . Elevated red blood cell count 05/01/2018  . Vitamin D deficiency 04/16/2018  . Essential hypertension 12/27/2017  . Bilateral impacted cerumen 12/27/2017  . Mild intermittent asthma without complication 64/33/2951  . Pre-diabetes 09/29/2016  . Obesity (BMI 35.0-39.9 without comorbidity) 09/29/2016  . GERD (gastroesophageal reflux disease) 02/21/2015    Social History   Tobacco Use  . Smoking status: Never Smoker  . Smokeless tobacco: Never Used   Substance Use Topics  . Alcohol use: Yes    Comment: Socially  . Drug use: No    Current Medications and Allergies:    Current Outpatient Medications:  .  albuterol (PROVENTIL HFA;VENTOLIN HFA) 108 (90 Base) MCG/ACT inhaler, Inhale 2 puffs into the lungs every 4 (four) hours as needed for wheezing or shortness of breath (cough, shortness of breath or wheezing.)., Disp: 1 Inhaler, Rfl: 6 .  Butalbital-APAP-Caffeine 50-300-40 MG CAPS, Take 1 capsule by mouth every 6 (six) hours., Disp: 30 capsule, Rfl: 1 .  Fexofenadine HCl (ALLEGRA ALLERGY PO), Take 1 tablet by mouth daily as needed (allergies)., Disp: , Rfl:  .  hydrOXYzine (ATARAX/VISTARIL) 25 MG tablet, Take 1 tablet (25 mg total) by mouth at bedtime as needed for anxiety (insomnia). Take one 25 mg tablet as needed for anxiety. May increase to 2 tabs., Disp: 60 tablet, Rfl: 0 .  lisinopril (ZESTRIL) 20 MG tablet, TAKE 1 TABLET BY MOUTH EVERY DAY, Disp: 30 tablet, Rfl: 0 .  metFORMIN (GLUCOPHAGE) 500 MG tablet, TAKE 1 TABLET BY MOUTH DAILY WITH BREAKFAST, Disp: 90 tablet, Rfl: 0  No Known Allergies  Review of Systems   ROS  Negative unless otherwise specified per HPI.  Vitals:   Vitals:   01/16/19 1445  BP: 130/90  Pulse: 77  Temp: 98.5 F (36.9 C)  TempSrc: Oral  SpO2: 97%  Weight: 259 lb (117.5 kg)  Height: 5' 10.5" (1.791 m)     Body mass index is 36.64 kg/m.   Physical Exam:    Physical Exam Vitals signs and nursing note reviewed.  Constitutional:      General: He is not in acute distress.    Appearance: He is well-developed. He is not ill-appearing or toxic-appearing.  Cardiovascular:     Rate and Rhythm: Normal rate and regular rhythm.     Pulses: Normal pulses.     Heart sounds: Normal heart sounds, S1 normal and S2 normal.     Comments: No LE edema Pulmonary:     Effort: Pulmonary effort is normal.     Breath sounds: Normal breath sounds.  Skin:    General: Skin is warm and dry.  Neurological:      Mental Status: He is alert.     GCS: GCS eye subscore is 4. GCS verbal subscore is 5. GCS motor subscore is 6.  Psychiatric:        Speech: Speech normal.        Behavior: Behavior normal. Behavior is cooperative.    Results for orders placed or performed in  visit on 01/16/19  POCT glycosylated hemoglobin (Hb A1C)  Result Value Ref Range   Hemoglobin A1C 6.0 (A) 4.0 - 5.6 %      Assessment and Plan:    Ryan Rowe was seen today for hypertension and f/u on pre-diabetes.  Diagnoses and all orders for this visit:  Essential hypertension Continue resuming Lisinopril 20 mg daily. Check BP at work and follow-up soon if numbers consistently >140/90 or if symptoms develop. Otherwise f/u in 3 months. Work on weight loss to get back down to 245 lb. Avoid salt and excessive ETOH.  -     Basic metabolic panel  Pre-diabetes HgbA1c is actually down from last check. Continue Metformin 500 mg daily. Follow-up in 3-6 months. -     POCT glycosylated hemoglobin (Hb A1C)  . Reviewed expectations re: course of current medical issues. . Discussed self-management of symptoms. . Outlined signs and symptoms indicating need for more acute intervention. . Patient verbalized understanding and all questions were answered. . See orders for this visit as documented in the electronic medical record. . Patient received an After Visit Summary.  CMA or LPN served as scribe during this visit. History, Physical, and Plan performed by medical provider. The above documentation has been reviewed and is accurate and complete.   Ryan MottoSamantha Jamyla Ard, PA-C [place], [place] 01/16/2019  Follow-up: No follow-ups on file.

## 2019-01-17 ENCOUNTER — Telehealth: Payer: BLUE CROSS/BLUE SHIELD | Admitting: Physician Assistant

## 2019-01-17 LAB — BASIC METABOLIC PANEL
BUN: 12 mg/dL (ref 6–23)
CO2: 28 mEq/L (ref 19–32)
Calcium: 9.9 mg/dL (ref 8.4–10.5)
Chloride: 103 mEq/L (ref 96–112)
Creatinine, Ser: 1.09 mg/dL (ref 0.40–1.50)
GFR: 93.97 mL/min (ref >60.00)
Glucose, Bld: 114 mg/dL — ABNORMAL HIGH (ref 70–99)
Potassium: 4 mEq/L (ref 3.5–5.1)
Sodium: 139 mEq/L (ref 135–145)

## 2019-01-20 ENCOUNTER — Encounter (HOSPITAL_COMMUNITY): Payer: Self-pay

## 2019-01-20 ENCOUNTER — Other Ambulatory Visit: Payer: Self-pay

## 2019-01-20 ENCOUNTER — Ambulatory Visit (HOSPITAL_COMMUNITY)
Admission: EM | Admit: 2019-01-20 | Discharge: 2019-01-20 | Disposition: A | Discharge status: Home / Self Care | Payer: BC Managed Care – PPO | Attending: Family Medicine | Admitting: Family Medicine

## 2019-01-20 DIAGNOSIS — Z20822 Contact with and (suspected) exposure to covid-19: Secondary | ICD-10-CM

## 2019-01-20 DIAGNOSIS — Z20828 Contact with and (suspected) exposure to other viral communicable diseases: Secondary | ICD-10-CM | POA: Diagnosis not present

## 2019-01-20 NOTE — ED Triage Notes (Signed)
Pt states he has been exposed to Covid on his jobs. Pt states his coworker on his job has [place].

## 2019-01-20 NOTE — Discharge Instructions (Addendum)
Person Under Monitoring Name: Ryan Rowe [facility]  Location: [address] [place] [address]   Infection Prevention Recommendations for Individuals Confirmed to have, or Being Evaluated for, 2019 Novel Coronavirus (COVID-19) Infection Who Receive Care at Home  Individuals who are confirmed to have, or are being evaluated for, COVID-19 should follow the prevention steps below until a healthcare provider or local or state health department says they can return to normal activities.  Stay home except to get medical care You should restrict activities outside your home, except for getting medical care. Do not go to work, school, or public areas, and do not use public transportation or taxis.  Call ahead before visiting your doctor Before your medical appointment, call the healthcare provider and tell them that you have, or are being evaluated for, COVID-19 infection. This will help the healthcare providers office take steps to keep other people from getting infected. Ask your healthcare provider to call the local or state health department.  Monitor your symptoms Seek prompt medical attention if your illness is worsening (e.g., difficulty breathing). Before going to your medical appointment, call the healthcare provider and tell them that you have, or are being evaluated for, COVID-19 infection. Ask your healthcare provider to call the local or state health department.  Wear a facemask You should wear a facemask that covers your nose and mouth when you are in the same room with other people and when you visit a healthcare provider. People who live with or visit you should also wear a facemask while they are in the same room with you.  Separate yourself from other people in your home As much as possible, you should stay in a different room from other people in your home. Also, you should use a separate bathroom, if available.  Avoid sharing household  items You should not share dishes, drinking glasses, cups, eating utensils, towels, bedding, or other items with other people in your home. After using these items, you should wash them thoroughly with soap and water.  Cover your coughs and sneezes Cover your mouth and nose with a tissue when you cough or sneeze, or you can cough or sneeze into your sleeve. Throw used tissues in a lined trash can, and immediately wash your hands with soap and water for at least 20 seconds or use an alcohol-based hand rub.  Wash your [facility] your hands often and thoroughly with soap and water for at least 20 seconds. You can use an alcohol-based hand sanitizer if soap and water are not available and if your hands are not visibly dirty. Avoid touching your eyes, nose, and mouth with unwashed hands.   Prevention Steps for Caregivers and Household Members of Individuals Confirmed to have, or Being Evaluated for, COVID-19 Infection Being Cared for in the Home  If you live with, or provide care at home for, a person confirmed to have, or being evaluated for, COVID-19 infection please follow these guidelines to prevent infection:  Follow healthcare providers instructions Make sure that you understand and can help the patient follow any healthcare provider instructions for all care.  Provide for the patients basic needs You should help the patient with basic needs in the home and provide support for getting groceries, prescriptions, and other personal needs.  Monitor the patients symptoms If they are getting sicker, call his or her medical provider and tell them that the patient has, or is being evaluated for, COVID-19 infection. This will help the  healthcare providers office take steps to keep other people from getting infected. Ask the healthcare provider to call the local or state health department.  Limit the number of people who have contact with the patient If possible, have only one caregiver  for the patient. Other household members should stay in another home or place of residence. If this is not possible, they should stay in another room, or be separated from the patient as much as possible. Use a separate bathroom, if available. Restrict visitors who do not have an essential need to be in the home.  Keep older adults, very young children, and other sick people away from the patient Keep older adults, very young children, and those who have compromised immune systems or chronic health conditions away from the patient. This includes people with chronic heart, lung, or kidney conditions, diabetes, and cancer.  Ensure good ventilation Make sure that shared spaces in the home have good air flow, such as from an air conditioner or an opened window, weather permitting.  Wash your hands often Wash your hands often and thoroughly with soap and water for at least 20 seconds. You can use an alcohol based hand sanitizer if soap and water are not available and if your hands are not visibly dirty. Avoid touching your eyes, nose, and mouth with unwashed hands. Use disposable paper towels to dry your hands. If not available, use dedicated cloth towels and replace them when they become wet.  Wear a facemask and gloves Wear a disposable facemask at all times in the room and gloves when you touch or have contact with the patients blood, body fluids, and/or secretions or excretions, such as sweat, saliva, sputum, nasal mucus, vomit, urine, or feces.  Ensure the mask fits over your nose and mouth tightly, and do not touch it during use. Throw out disposable facemasks and gloves after using them. Do not reuse. Wash your hands immediately after removing your facemask and gloves. If your personal clothing becomes contaminated, carefully remove clothing and launder. Wash your hands after handling contaminated clothing. Place all used disposable facemasks, gloves, and other waste in a lined container  before disposing them with other household waste. Remove gloves and wash your hands immediately after handling these items.  Do not share dishes, glasses, or other household items with the patient Avoid sharing household items. You should not share dishes, drinking glasses, cups, eating utensils, towels, bedding, or other items with a patient who is confirmed to have, or being evaluated for, COVID-19 infection. After the person uses these items, you should wash them thoroughly with soap and water.  Wash laundry thoroughly Immediately remove and wash clothes or bedding that have blood, body fluids, and/or secretions or excretions, such as sweat, saliva, sputum, nasal mucus, vomit, urine, or feces, on them. Wear gloves when handling laundry from the patient. Read and follow directions on labels of laundry or clothing items and detergent. In general, wash and dry with the warmest temperatures recommended on the label.  Clean all areas the individual has used often Clean all touchable surfaces, such as counters, tabletops, doorknobs, bathroom fixtures, toilets, phones, keyboards, tablets, and bedside tables, every day. Also, clean any surfaces that may have blood, body fluids, and/or secretions or excretions on them. Wear gloves when cleaning surfaces the patient has come in contact with. Use a diluted bleach solution (e.g., dilute bleach with 1 part bleach and 10 parts water) or a household disinfectant with a label that says EPA-registered for coronaviruses. To  make a bleach solution at home, add 1 tablespoon of bleach to 1 quart (4 cups) of water. For a larger supply, add  cup of bleach to 1 gallon (16 cups) of water. Read labels of cleaning products and follow recommendations provided on product labels. Labels contain instructions for safe and effective use of the cleaning product including precautions you should take when applying the product, such as wearing gloves or eye protection and making  sure you have good ventilation during use of the product. Remove gloves and wash hands immediately after cleaning.  Monitor yourself for signs and symptoms of illness Caregivers and household members are considered close contacts, should monitor their health, and will be asked to limit movement outside of the home to the extent possible. Follow the monitoring steps for close contacts listed on the symptom monitoring form.   ? If you have additional questions, contact your local health department or call the epidemiologist on call at [phone] (available 24/7). ? This guidance is subject to change. For the most up-to-date guidance from [facility], please refer to their website: https://www.cdc.gov/coronavirus/2019-ncov/hcp/guidance-prevent-spread.html\

## 2019-01-20 NOTE — ED Provider Notes (Signed)
[facility]    CSN: [id number] Arrival date & time: 01/20/19  1028      History   Chief Complaint Chief Complaint  Patient presents with  . Covid test    HPI Ryan Rowe is a 33 y.o. male history of hypertension, GERD, presenting today for evaluation of possible COVID exposure.  Patient states that he has been exposed to COVID with from a coworker at his job.  He has denied any symptoms, he has had normal energy level.  He denies any URI symptoms.  Denies fevers chills or body aches.  Denies nausea vomiting or diarrhea.  Feels normal, wanting to ensure he is not positive.  HPI  Past Medical History:  Diagnosis Date  . GERD (gastroesophageal reflux disease)   . Hypertension   . Pre-diabetes     Patient Active Problem List   Diagnosis Date Noted  . Alpha thalassemia minor 05/01/2018  . Elevated red blood cell count 05/01/2018  . Vitamin D deficiency 04/16/2018  . Essential hypertension 12/27/2017  . Bilateral impacted cerumen 12/27/2017  . Mild intermittent asthma without complication 09/29/2016  . Pre-diabetes 09/29/2016  . Obesity (BMI 35.0-39.9 without comorbidity) 09/29/2016  . GERD (gastroesophageal reflux disease) 02/21/2015    Past Surgical History:  Procedure Laterality Date  . FRACTURE SURGERY Right    wrist       Home Medications    Prior to Admission medications   Medication Sig Start Date End Date Taking? Authorizing Provider  albuterol (PROVENTIL HFA;VENTOLIN HFA) 108 (90 Base) MCG/ACT inhaler Inhale 2 puffs into the lungs every 4 (four) hours as needed for wheezing or shortness of breath (cough, shortness of breath or wheezing.). 05/07/16   Elvina Sidle, MD  Butalbital-APAP-Caffeine 50-300-40 MG CAPS Take 1 capsule by mouth every 6 (six) hours. 12/29/17   Jarold Motto, PA  Fexofenadine HCl (ALLEGRA ALLERGY PO) Take 1 tablet by mouth daily as needed (allergies).    [provider]  hydrOXYzine (ATARAX/VISTARIL) 25 MG  tablet Take 1 tablet (25 mg total) by mouth at bedtime as needed for anxiety (insomnia). Take one 25 mg tablet as needed for anxiety. May increase to 2 tabs. 04/16/18   Jarold Motto, PA  lisinopril (ZESTRIL) 20 MG tablet TAKE 1 TABLET BY MOUTH EVERY DAY 12/12/18   Jarold Motto, PA  metFORMIN (GLUCOPHAGE) 500 MG tablet TAKE 1 TABLET BY MOUTH DAILY WITH BREAKFAST 12/06/18   Jarold Motto, PA    Family History Family History  Problem Relation Age of Onset  . Depression Mother   . Diabetes Mother        controlled w/o medication  . Hypertension Father   . Diabetes Other   . Cancer Maternal Aunt        breast  . ALS Maternal Grandfather   . Diabetes Maternal Grandfather   . Cancer Maternal Aunt        breast  . Heart disease Cousin 50  . Hypertension Paternal Grandmother   . Hypertension Paternal Grandfather   . Stroke Neg Hx     Social History Social History   Tobacco Use  . Smoking status: Never Smoker  . Smokeless tobacco: Never Used  Substance Use Topics  . Alcohol use: Yes    Comment: Socially  . Drug use: No     Allergies   Patient has no known allergies.   Review of Systems Review of Systems  Constitutional: Negative for activity change, appetite change, chills, fatigue and fever.  HENT: Negative for  congestion, ear pain, rhinorrhea, sinus pressure, sore throat and trouble swallowing.   Eyes: Negative for discharge and redness.  Respiratory: Negative for cough, chest tightness and shortness of breath.   Cardiovascular: Negative for chest pain.  Gastrointestinal: Negative for abdominal pain, diarrhea, nausea and vomiting.  Musculoskeletal: Negative for myalgias.  Skin: Negative for rash.  Neurological: Negative for dizziness, light-headedness and headaches.     Physical Exam Triage Vital Signs ED Triage Vitals  Enc Vitals Group     BP --      Pulse Rate 01/20/19 1106 90     Resp 01/20/19 1106 18     Temp 01/20/19 1106 98.6 F (37 C)     Temp  Source 01/20/19 1106 Oral     SpO2 01/20/19 1106 90 %     Weight --      Height --      Head Circumference --      Peak Flow --      Pain Score 01/20/19 1111 0     Pain Loc --      Pain Edu? --      Excl. in GC? --    No data found.  Updated Vital Signs Pulse 90   Temp 98.6 F (37 C) (Oral)   Resp 18   SpO2 90%  Oxygen rechecked on evaluation, 97% Visual Acuity Right Eye Distance:   Left Eye Distance:   Bilateral Distance:    Right Eye Near:   Left Eye Near:    Bilateral Near:     Physical Exam Vitals signs and nursing note reviewed.  Constitutional:      Appearance: He is well-developed.     Comments: Well-appearing, no acute distress  HENT:     Head: Normocephalic and atraumatic.     Mouth/Throat:     Comments: Oral mucosa pink and moist, no tonsillar enlargement or exudate. Posterior pharynx patent and nonerythematous, no uvula deviation or swelling. Normal phonation. Eyes:     Conjunctiva/sclera: Conjunctivae normal.  Neck:     Musculoskeletal: Neck supple.  Cardiovascular:     Rate and Rhythm: Normal rate and regular rhythm.     Heart sounds: No murmur.  Pulmonary:     Effort: Pulmonary effort is normal. No respiratory distress.     Breath sounds: Normal breath sounds.     Comments: Breathing comfortably at rest, CTABL, no wheezing, rales or other adventitious sounds auscultated Abdominal:     Palpations: Abdomen is soft.     Tenderness: There is no abdominal tenderness.  Skin:    General: Skin is warm and dry.  Neurological:     Mental Status: He is alert.      UC Treatments / Results  Labs (all labs ordered are listed, but only abnormal results are displayed) Labs Reviewed  NOVEL CORONAVIRUS, NAA (HOSPITAL ORDER, SEND-OUT TO REF LAB)    EKG   Radiology No results found.  Procedures Procedures (including critical care time)  Medications Ordered in UC Medications - No data to display  Initial Impression / Assessment and Plan / UC  Course  I have reviewed the triage vital signs and the nursing notes.  Pertinent labs & imaging results that were available during my care of the patient were reviewed by me and considered in my medical decision making (see chart for details).     COVID swab obtained.  Currently asymptomatic, vital signs stable, O2 rechecked and was 97% with good waveform, lungs clear, breathing comfortably.  Will have  remain home until results return.Discussed strict return precautions. Patient verbalized understanding and is agreeable with plan.  Final Clinical Impressions(s) / UC Diagnoses   Final diagnoses:  Exposure to Covid-19 Virus     Discharge Instructions        Person Under Monitoring Name: Ryan Rowe [facility]  Location: [address] [place] [address]   Infection Prevention Recommendations for Individuals Confirmed to have, or Being Evaluated for, 2019 Novel Coronavirus (COVID-19) Infection Who Receive Care at Home  Individuals who are confirmed to have, or are being evaluated for, COVID-19 should follow the prevention steps below until a healthcare provider or local or state health department says they can return to normal activities.  Stay home except to get medical care You should restrict activities outside your home, except for getting medical care. Do not go to work, school, or public areas, and do not use public transportation or taxis.  Call ahead before visiting your doctor Before your medical appointment, call the healthcare provider and tell them that you have, or are being evaluated for, COVID-19 infection. This will help the healthcare provider's office take steps to keep other people from getting infected. Ask your healthcare provider to call the local or state health department.  Monitor your symptoms Seek prompt medical attention if your illness is worsening (e.g., difficulty breathing). Before going to your medical appointment, call the  healthcare provider and tell them that you have, or are being evaluated for, COVID-19 infection. Ask your healthcare provider to call the local or state health department.  Wear a facemask You should wear a facemask that covers your nose and mouth when you are in the same room with other people and when you visit a healthcare provider. People who live with or visit you should also wear a facemask while they are in the same room with you.  Separate yourself from other people in your home As much as possible, you should stay in a different room from other people in your home. Also, you should use a separate bathroom, if available.  Avoid sharing household items You should not share dishes, drinking glasses, cups, eating utensils, towels, bedding, or other items with other people in your home. After using these items, you should wash them thoroughly with soap and water.  Cover your coughs and sneezes Cover your mouth and nose with a tissue when you cough or sneeze, or you can cough or sneeze into your sleeve. Throw used tissues in a lined trash can, and immediately wash your hands with soap and water for at least 20 seconds or use an alcohol-based hand rub.  Wash your [employer] your hands often and thoroughly with soap and water for at least 20 seconds. You can use an alcohol-based hand sanitizer if soap and water are not available and if your hands are not visibly dirty. Avoid touching your eyes, nose, and mouth with unwashed hands.   Prevention Steps for Caregivers and Household Members of Individuals Confirmed to have, or Being Evaluated for, COVID-19 Infection Being Cared for in the Home  If you live with, or provide care at home for, a person confirmed to have, or being evaluated for, COVID-19 infection please follow these guidelines to prevent infection:  Follow healthcare provider's instructions Make sure that you understand and can help the patient follow any healthcare  provider instructions for all care.  Provide for the patient's basic needs You should help the patient with basic needs in the home and  provide support for getting groceries, prescriptions, and other personal needs.  Monitor the patient's symptoms If they are getting sicker, call his or her medical provider and tell them that the patient has, or is being evaluated for, COVID-19 infection. This will help the healthcare provider's office take steps to keep other people from getting infected. Ask the healthcare provider to call the local or state health department.  Limit the number of people who have contact with the patient  If possible, have only one caregiver for the patient.  Other household members should stay in another home or place of residence. If this is not possible, they should stay  in another room, or be separated from the patient as much as possible. Use a separate bathroom, if available.  Restrict visitors who do not have an essential need to be in the home.  Keep older adults, very young children, and other sick people away from the patient Keep older adults, very young children, and those who have compromised immune systems or chronic health conditions away from the patient. This includes people with chronic heart, lung, or kidney conditions, diabetes, and cancer.  Ensure good ventilation Make sure that shared spaces in the home have good air flow, such as from an air conditioner or an opened window, weather permitting.  Wash your hands often  Wash your hands often and thoroughly with soap and water for at least 20 seconds. You can use an alcohol based hand sanitizer if soap and water are not available and if your hands are not visibly dirty.  Avoid touching your eyes, nose, and mouth with unwashed hands.  Use disposable paper towels to dry your hands. If not available, use dedicated cloth towels and replace them when they become wet.  Wear a facemask and gloves   Wear a disposable facemask at all times in the room and gloves when you touch or have contact with the patient's blood, body fluids, and/or secretions or excretions, such as sweat, saliva, sputum, nasal mucus, vomit, urine, or feces.  Ensure the mask fits over your nose and mouth tightly, and do not touch it during use.  Throw out disposable facemasks and gloves after using them. Do not reuse.  Wash your hands immediately after removing your facemask and gloves.  If your personal clothing becomes contaminated, carefully remove clothing and launder. Wash your hands after handling contaminated clothing.  Place all used disposable facemasks, gloves, and other waste in a lined container before disposing them with other household waste.  Remove gloves and wash your hands immediately after handling these items.  Do not share dishes, glasses, or other household items with the patient  Avoid sharing household items. You should not share dishes, drinking glasses, cups, eating utensils, towels, bedding, or other items with a patient who is confirmed to have, or being evaluated for, COVID-19 infection.  After the person uses these items, you should wash them thoroughly with soap and water.  Wash laundry thoroughly  Immediately remove and wash clothes or bedding that have blood, body fluids, and/or secretions or excretions, such as sweat, saliva, sputum, nasal mucus, vomit, urine, or feces, on them.  Wear gloves when handling laundry from the patient.  Read and follow directions on labels of laundry or clothing items and detergent. In general, wash and dry with the warmest temperatures recommended on the label.  Clean all areas the individual has used often  Clean all touchable surfaces, such as counters, tabletops, doorknobs, bathroom fixtures, toilets, phones, keyboards, tablets,  and bedside tables, every day. Also, clean any surfaces that may have blood, body fluids, and/or secretions or  excretions on them.  Wear gloves when cleaning surfaces the patient has come in contact with.  Use a diluted bleach solution (e.g., dilute bleach with 1 part bleach and 10 parts water) or a household disinfectant with a label that says EPA-registered for coronaviruses. To make a bleach solution at home, add 1 tablespoon of bleach to 1 quart (4 cups) of water. For a larger supply, add  cup of bleach to 1 gallon (16 cups) of water.  Read labels of cleaning products and follow recommendations provided on product labels. Labels contain instructions for safe and effective use of the cleaning product including precautions you should take when applying the product, such as wearing gloves or eye protection and making sure you have good ventilation during use of the product.  Remove gloves and wash hands immediately after cleaning.  Monitor yourself for signs and symptoms of illness Caregivers and household members are considered close contacts, should monitor their health, and will be asked to limit movement outside of the home to the extent possible. Follow the monitoring steps for close contacts listed on the symptom monitoring form.   ? If you have additional questions, contact your local health department or call the epidemiologist on call at [phone] (available 24/7). ? This guidance is subject to change. For the most up-to-date guidance from [facility], please refer to their website: https://www.cdc.gov/coronavirus/2019-ncov/hcp/guidance-prevent-spread.html\   ED Prescriptions    None     Controlled Substance Prescriptions [place] Controlled Substance Registry consulted? Not Applicable   Lew DawesWieters, Bev Drennen C, [place] JerseyPA-C 01/20/19 1207

## 2019-01-22 DIAGNOSIS — R310 Gross hematuria: Secondary | ICD-10-CM | POA: Diagnosis not present

## 2019-01-22 DIAGNOSIS — L662 Folliculitis decalvans: Secondary | ICD-10-CM | POA: Diagnosis not present

## 2019-01-23 LAB — NOVEL CORONAVIRUS, NAA (HOSP ORDER, SEND-OUT TO REF LAB; TAT 18-24 HRS): SARS-CoV-2, NAA: NOT DETECTED

## 2019-01-24 ENCOUNTER — Encounter (HOSPITAL_COMMUNITY): Payer: Self-pay

## 2019-03-04 ENCOUNTER — Other Ambulatory Visit: Payer: Self-pay | Admitting: Physician Assistant

## 2019-03-08 ENCOUNTER — Ambulatory Visit (INDEPENDENT_AMBULATORY_CARE_PROVIDER_SITE_OTHER): Payer: BC Managed Care – PPO | Admitting: Physician Assistant

## 2019-03-08 ENCOUNTER — Encounter: Payer: Self-pay | Admitting: Physician Assistant

## 2019-03-08 VITALS — Temp 97.6°F

## 2019-03-08 DIAGNOSIS — R197 Diarrhea, unspecified: Secondary | ICD-10-CM

## 2019-03-08 DIAGNOSIS — R112 Nausea with vomiting, unspecified: Secondary | ICD-10-CM

## 2019-03-08 NOTE — Progress Notes (Signed)
Virtual Visit via Video   I connected with Ryan Rowe on 03/08/19 at  1:00 PM EDT by a video enabled telemedicine application and verified that I am speaking with the correct person using two identifiers. Location patient: Home Location provider: [facility], Office Persons participating in the virtual visit: Daine Gravelarl Renard Stanbery, Janessa Mickle PA-C.   I discussed the limitations of evaluation and management by telemedicine and the availability of in person appointments. The patient expressed understanding and agreed to proceed.  I acted as a Neurosurgeonscribe for Energy East CorporationSamantha Christna Kulick, PA-C [employer]ly-ClarkDonna Orphanos, LPN  Subjective:   HPI:   Vomiting and diarrhea Pt started with vomiting and diarrhea Monday evening. He said he was at a cookout on Sunday. He said the last time he vomited was Wed and last episode of diarrhea was yesterday x 1. Denies fever, chills, cough, URI symptoms.  He has history of norovirus (works at an [employer]) and feels like this is a usual case of that for him.  He is eating regular foods today. Has been drinking gatorade and water, feels well hydrated.  ROS: See pertinent positives and negatives per HPI.  Patient Active Problem List   Diagnosis Date Noted  . Alpha thalassemia minor 05/01/2018  . Elevated red blood cell count 05/01/2018  . Vitamin D deficiency 04/16/2018  . Essential hypertension 12/27/2017  . Bilateral impacted cerumen 12/27/2017  . Mild intermittent asthma without complication 09/29/2016  . Pre-diabetes 09/29/2016  . Obesity (BMI 35.0-39.9 without comorbidity) 09/29/2016  . GERD (gastroesophageal reflux disease) 02/21/2015    Social History   Tobacco Use  . Smoking status: Never Smoker  . Smokeless tobacco: Never Used  Substance Use Topics  . Alcohol use: Yes    Comment: Socially    Current Outpatient Medications:  .  albuterol (PROVENTIL HFA;VENTOLIN HFA) 108 (90 Base) MCG/ACT inhaler, Inhale 2 puffs into the lungs every 4  (four) hours as needed for wheezing or shortness of breath (cough, shortness of breath or wheezing.)., Disp: 1 Inhaler, Rfl: 6 .  Butalbital-APAP-Caffeine 50-300-40 MG CAPS, Take 1 capsule by mouth every 6 (six) hours., Disp: 30 capsule, Rfl: 1 .  Fexofenadine HCl (ALLEGRA ALLERGY PO), Take 1 tablet by mouth daily as needed (allergies)., Disp: , Rfl:  .  hydrOXYzine (ATARAX/VISTARIL) 25 MG tablet, Take 1 tablet (25 mg total) by mouth at bedtime as needed for anxiety (insomnia). Take one 25 mg tablet as needed for anxiety. May increase to 2 tabs., Disp: 60 tablet, Rfl: 0 .  lisinopril (ZESTRIL) 20 MG tablet, TAKE 1 TABLET BY MOUTH EVERY DAY, Disp: 90 tablet, Rfl: 0 .  metFORMIN (GLUCOPHAGE) 500 MG tablet, TAKE 1 TABLET BY MOUTH DAILY WITH BREAKFAST, Disp: 90 tablet, Rfl: 0  No Known Allergies  Objective:   VITALS: Per patient if applicable, see vitals. GENERAL: Alert, appears well and in no acute distress. HEENT: Atraumatic, conjunctiva clear, no obvious abnormalities on inspection of external nose and ears. NECK: Normal movements of the head and neck. CARDIOPULMONARY: No increased WOB. Speaking in clear sentences. I:E ratio WNL.  MS: Moves all visible extremities without noticeable abnormality. PSYCH: Pleasant and cooperative, well-groomed. Speech normal rate and rhythm. Affect is appropriate. Insight and judgement are appropriate. Attention is focused, linear, and appropriate.  NEURO: CN grossly intact. Oriented as arrived to appointment on time with no prompting. Moves both UE equally.  SKIN: No obvious lesions, wounds, erythema, or cyanosis noted on face or hands.  Assessment and Plan:   Ryan Rowe  was seen today for emesis and diarrhea.  Diagnoses and all orders for this visit:  Non-intractable vomiting with nausea, unspecified vomiting type  Diarrhea of presumed infectious origin   Patient appears in no acute distress today.  He is eating regular foods and tolerating without any GI  symptoms at this time.  Suspect that he did have a bout of gastroenteritis.  Worsening precautions advised.  No needs identified at this time.  Did discuss that with his sx I cannot r/o COVID-19. Continue to follow CDC recommendations -- washing hands, wearing masks, avoiding non essential places. Close follow-up if any changes in symptoms. He declines COVID-19 testing at this time.  . Reviewed expectations re: course of current medical issues. . Discussed self-management of symptoms. . Outlined signs and symptoms indicating need for more acute intervention. . Patient verbalized understanding and all questions were answered. Marland Kitchen Health Maintenance issues including appropriate healthy diet, exercise, and smoking avoidance were discussed with patient. . See orders for this visit as documented in the electronic medical record.  I discussed the assessment and treatment plan with the patient. The patient was provided an opportunity to ask questions and all were answered. The patient agreed with the plan and demonstrated an understanding of the instructions.   The patient was advised to call back or seek an in-person evaluation if the symptoms worsen or if the condition fails to improve as anticipated.   CMA or LPN served as scribe during this visit. History, Physical, and Plan performed by medical provider. The above documentation has been reviewed and is accurate and complete.   [place], [place] 03/08/2019

## 2019-04-16 ENCOUNTER — Ambulatory Visit: Payer: BC Managed Care – PPO | Admitting: Physician Assistant

## 2019-04-16 NOTE — Progress Notes (Deleted)
Ryan Rowe is a 33 y.o. male is here to follow up on Hypertension and Pre-diabetes.  I acted as a Education administrator for [employer], PA-C [employer], LPN  History of Present Illness:   No chief complaint on file.   HPI  HTN Pt following up today on blood pressure. Currently taking Lisinopril 20 mg daily. Pt denies headaches, dizziness, blurred vision, chest pain, SOB or lower leg edema. Denies excessive caffeine intake, stimulant usage, excessive alcohol intake or increase in salt consumption.   Health Maintenance Due  Topic Date Due  . INFLUENZA VACCINE  01/26/2019    Past Medical History:  Diagnosis Date  . GERD (gastroesophageal reflux disease)   . Hypertension   . Pre-diabetes      Social History   Socioeconomic History  . Marital status: Single    Spouse name: Not on file  . Number of children: Not on file  . Years of education: Not on file  . Highest education level: Not on file  Occupational History  . Not on file  Social Needs  . Financial resource strain: Not on file  . Food insecurity    Worry: Not on file    Inability: Not on file  . Transportation needs    Medical: Not on file    Non-medical: Not on file  Tobacco Use  . Smoking status: Never Smoker  . Smokeless tobacco: Never Used  Substance and Sexual Activity  . Alcohol use: Yes    Comment: Socially  . Drug use: No  . Sexual activity: Yes  Lifestyle  . Physical activity    Days per week: Not on file    Minutes per session: Not on file  . Stress: Not on file  Relationships  . Social Herbalist on phone: Not on file    Gets together: Not on file    Attends religious service: Not on file    Active member of club or organization: Not on file    Attends meetings of clubs or organizations: Not on file    Relationship status: Not on file  . Intimate partner violence    Fear of current or ex partner: Not on file    Emotionally abused: Not on file    Physically abused: Not on  file    Forced sexual activity: Not on file  Other Topics Concern  . 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  Single, no children.  Exercise - regularly. Diet - healthy.       Past Surgical History:  Procedure Laterality Date  . FRACTURE SURGERY Right    wrist    Family History  Problem Relation Age of Onset  . Depression Mother   . Diabetes Mother        controlled w/o medication  . Hypertension Father   . Diabetes Other   . Cancer Maternal Aunt        breast  . ALS Maternal Grandfather   . Diabetes Maternal Grandfather   . Cancer Maternal Aunt        breast  . Heart disease Cousin 74  . Hypertension Paternal Grandmother   . Hypertension Paternal Grandfather   . Stroke Neg Hx     PMHx, SurgHx, SocialHx, FamHx, Medications, and Allergies were reviewed in the Visit Navigator and updated as appropriate.   Patient Active Problem List   Diagnosis Date Noted  . Alpha thalassemia minor  05/01/2018  . Elevated red blood cell count 05/01/2018  . Vitamin D deficiency 04/16/2018  . Essential hypertension 12/27/2017  . Bilateral impacted cerumen 12/27/2017  . Mild intermittent asthma without complication 09/29/2016  . Pre-diabetes 09/29/2016  . Obesity (BMI 35.0-39.9 without comorbidity) 09/29/2016  . GERD (gastroesophageal reflux disease) 02/21/2015    Social History   Tobacco Use  . Smoking status: Never Smoker  . Smokeless tobacco: Never Used  Substance Use Topics  . Alcohol use: Yes    Comment: Socially  . Drug use: No    Current Medications and Allergies:    Current Outpatient Medications:  .  albuterol (PROVENTIL HFA;VENTOLIN HFA) 108 (90 Base) MCG/ACT inhaler, Inhale 2 puffs into the lungs every 4 (four) hours as needed for wheezing or shortness of breath (cough, shortness of breath or wheezing.)., Disp: 1 Inhaler, Rfl: 6 .  Butalbital-APAP-Caffeine 50-300-40 MG CAPS, Take 1 capsule by mouth every 6  (six) hours., Disp: 30 capsule, Rfl: 1 .  Fexofenadine HCl (ALLEGRA ALLERGY PO), Take 1 tablet by mouth daily as needed (allergies)., Disp: , Rfl:  .  hydrOXYzine (ATARAX/VISTARIL) 25 MG tablet, Take 1 tablet (25 mg total) by mouth at bedtime as needed for anxiety (insomnia). Take one 25 mg tablet as needed for anxiety. May increase to 2 tabs., Disp: 60 tablet, Rfl: 0 .  lisinopril (ZESTRIL) 20 MG tablet, TAKE 1 TABLET BY MOUTH EVERY DAY, Disp: 90 tablet, Rfl: 0 .  metFORMIN (GLUCOPHAGE) 500 MG tablet, TAKE 1 TABLET BY MOUTH DAILY WITH BREAKFAST, Disp: 90 tablet, Rfl: 0  No Known Allergies  Review of Systems   ROS  Vitals:  There were no vitals filed for this visit.   There is no height or weight on file to calculate BMI.   Physical Exam:    Physical Exam   Assessment and Plan:    There are no diagnoses linked to this encounter.  . Reviewed expectations re: course of current medical issues. . Discussed self-management of symptoms. . Outlined signs and symptoms indicating need for more acute intervention. . Patient verbalized understanding and all questions were answered. . See orders for this visit as documented in the electronic medical record. . Patient received an After Visit Summary.  ***  Jarold Motto, PA-C [place] [place] [facility] 04/16/2019  Follow-up: No follow-ups on file.

## 2019-04-22 ENCOUNTER — Encounter: Payer: Self-pay | Admitting: Physician Assistant

## 2019-06-03 DIAGNOSIS — R5382 Chronic fatigue, unspecified: Secondary | ICD-10-CM | POA: Diagnosis not present

## 2019-06-03 DIAGNOSIS — Z20828 Contact with and (suspected) exposure to other viral communicable diseases: Secondary | ICD-10-CM | POA: Diagnosis not present

## 2019-06-03 DIAGNOSIS — Z1383 Encounter for screening for respiratory disorder NEC: Secondary | ICD-10-CM | POA: Diagnosis not present

## 2019-06-27 ENCOUNTER — Other Ambulatory Visit: Payer: Self-pay | Admitting: Physician Assistant

## 2019-07-15 ENCOUNTER — Encounter: Payer: Self-pay | Admitting: Physician Assistant

## 2019-07-15 ENCOUNTER — Other Ambulatory Visit: Payer: Self-pay | Admitting: Physician Assistant

## 2019-07-15 ENCOUNTER — Other Ambulatory Visit: Payer: Self-pay

## 2019-07-15 ENCOUNTER — Ambulatory Visit (INDEPENDENT_AMBULATORY_CARE_PROVIDER_SITE_OTHER): Payer: BC Managed Care – PPO | Admitting: Physician Assistant

## 2019-07-15 VITALS — BP 132/96 | HR 77 | Temp 98.7°F | Ht 70.5 in | Wt 264.4 lb

## 2019-07-15 DIAGNOSIS — Z13220 Encounter for screening for lipoid disorders: Secondary | ICD-10-CM | POA: Diagnosis not present

## 2019-07-15 DIAGNOSIS — H6123 Impacted cerumen, bilateral: Secondary | ICD-10-CM

## 2019-07-15 DIAGNOSIS — I10 Essential (primary) hypertension: Secondary | ICD-10-CM | POA: Diagnosis not present

## 2019-07-15 DIAGNOSIS — Z136 Encounter for screening for cardiovascular disorders: Secondary | ICD-10-CM

## 2019-07-15 DIAGNOSIS — R7303 Prediabetes: Secondary | ICD-10-CM

## 2019-07-15 DIAGNOSIS — E669 Obesity, unspecified: Secondary | ICD-10-CM

## 2019-07-15 DIAGNOSIS — Z0000 Encounter for general adult medical examination without abnormal findings: Secondary | ICD-10-CM

## 2019-07-15 LAB — CBC WITH DIFFERENTIAL/PLATELET
Basophils Absolute: 0 10*3/uL (ref 0.0–0.1)
Basophils Relative: 0.6 % (ref 0.0–3.0)
Eosinophils Absolute: 0.1 10*3/uL (ref 0.0–0.7)
Eosinophils Relative: 1.4 % (ref 0.0–5.0)
HCT: 45 % (ref 39.0–52.0)
Hemoglobin: 14.4 g/dL (ref 13.0–17.0)
Lymphocytes Relative: 42.1 % (ref 12.0–46.0)
Lymphs Abs: 2 10*3/uL (ref 0.7–4.0)
MCHC: 31.9 g/dL (ref 30.0–36.0)
MCV: 69.9 fl — ABNORMAL LOW (ref 78.0–100.0)
Monocytes Absolute: 0.3 10*3/uL (ref 0.1–1.0)
Monocytes Relative: 7.3 % (ref 3.0–12.0)
Neutro Abs: 2.3 10*3/uL (ref 1.4–7.7)
Neutrophils Relative %: 48.6 % (ref 43.0–77.0)
Platelets: 283 10*3/uL (ref 150.0–400.0)
RBC: 6.44 Mil/uL — ABNORMAL HIGH (ref 4.22–5.81)
RDW: 14.6 % (ref 11.5–15.5)
WBC: 4.6 10*3/uL (ref 4.0–10.5)

## 2019-07-15 LAB — LIPID PANEL
Cholesterol: 188 mg/dL (ref 0–200)
HDL: 40.2 mg/dL (ref >39.00)
LDL Cholesterol: 136 mg/dL — ABNORMAL HIGH (ref 0–99)
NonHDL: 148.02
Total CHOL/HDL Ratio: 5
Triglycerides: 62 mg/dL (ref 0.0–149.0)
VLDL: 12.4 mg/dL (ref 0.0–40.0)

## 2019-07-15 LAB — COMPREHENSIVE METABOLIC PANEL
ALT: 24 U/L (ref 0–53)
AST: 19 U/L (ref 0–37)
Albumin: 4.5 g/dL (ref 3.5–5.2)
Alkaline Phosphatase: 45 U/L (ref 39–117)
BUN: 11 mg/dL (ref 6–23)
CO2: 29 mEq/L (ref 19–32)
Calcium: 9.4 mg/dL (ref 8.4–10.5)
Chloride: 102 mEq/L (ref 96–112)
Creatinine, Ser: 1.06 mg/dL (ref 0.40–1.50)
GFR: 96.76 mL/min (ref >60.00)
Glucose, Bld: 120 mg/dL — ABNORMAL HIGH (ref 70–99)
Potassium: 4.1 mEq/L (ref 3.5–5.1)
Sodium: 137 mEq/L (ref 135–145)
Total Bilirubin: 0.4 mg/dL (ref 0.2–1.2)
Total Protein: 7.8 g/dL (ref 6.0–8.3)

## 2019-07-15 LAB — HEMOGLOBIN A1C: Hgb A1c MFr Bld: 6.2 % (ref 4.6–6.5)

## 2019-07-15 MED ORDER — METFORMIN HCL 500 MG PO TABS: 500.0000 mg | ORAL_TABLET | Freq: Every day | ORAL | 2 refills | Status: DC

## 2019-07-15 NOTE — Patient Instructions (Signed)
It was great to see you!  I do recommend the covid vaccine next time it's offered to you! Please work on getting more active!  Please go to the lab for blood work.   Our office will call you with your results unless you have chosen to receive results via MyChart.  If your blood work is normal we will follow-up each year for physicals and as scheduled for chronic medical problems.  If anything is abnormal we will treat accordingly and get you in for a follow-up.  Take care,  [facility] Maintenance, Male Adopting a healthy lifestyle and getting preventive care are important in promoting health and wellness. Ask your health care provider about:  The right schedule for you to have regular tests and exams.  Things you can do on your own to prevent diseases and keep yourself healthy. What should I know about diet, weight, and exercise? Eat a healthy diet   Eat a diet that includes plenty of vegetables, fruits, low-fat dairy products, and lean protein.  Do not eat a lot of foods that are high in solid fats, added sugars, or sodium. Maintain a healthy weight Body mass index (BMI) is a measurement that can be used to identify possible weight problems. It estimates body fat based on height and weight. Your health care provider can help determine your BMI and help you achieve or maintain a healthy weight. Get regular exercise Get regular exercise. This is one of the most important things you can do for your health. Most adults should:  Exercise for at least 150 minutes each week. The exercise should increase your heart rate and make you sweat (moderate-intensity exercise).  Do strengthening exercises at least twice a week. This is in addition to the moderate-intensity exercise.  Spend less time sitting. Even light physical activity can be beneficial. Watch cholesterol and blood lipids Have your blood tested for lipids and cholesterol at 34 years of age, then have this test  every 5 years. You may need to have your cholesterol levels checked more often if:  Your lipid or cholesterol levels are high.  You are older than 34 years of age.  You are at high risk for heart disease. What should I know about cancer screening? Many types of cancers can be detected early and may often be prevented. Depending on your health history and family history, you may need to have cancer screening at various ages. This may include screening for:  Colorectal cancer.  Prostate cancer.  Skin cancer.  Lung cancer. What should I know about heart disease, diabetes, and high blood pressure? Blood pressure and heart disease  High blood pressure causes heart disease and increases the risk of stroke. This is more likely to develop in people who have high blood pressure readings, are of African descent, or are overweight.  Talk with your health care provider about your target blood pressure readings.  Have your blood pressure checked: ? Every 3-5 years if you are 61-31 years of age. ? Every year if you are 51 years old or older.  If you are between the ages of 57 and 74 and are a current or former smoker, ask your health care provider if you should have a one-time screening for abdominal aortic aneurysm (AAA). Diabetes Have regular diabetes screenings. This checks your fasting blood sugar level. Have the screening done:  Once every three years after age 7 if you are at a normal weight and have a low risk  for diabetes.  More often and at a younger age if you are overweight or have a high risk for diabetes. What should I know about preventing infection? Hepatitis B If you have a higher risk for hepatitis B, you should be screened for this virus. Talk with your health care provider to find out if you are at risk for hepatitis B infection. Hepatitis C Blood testing is recommended for:  Everyone born from [birth year] through 1965.  Anyone with known risk factors for hepatitis  C. Sexually transmitted infections (STIs)  You should be screened each year for STIs, including gonorrhea and chlamydia, if: ? You are sexually active and are younger than 34 years of age. ? You are older than 34 years of age and your health care provider tells you that you are at risk for this type of infection. ? Your sexual activity has changed since you were last screened, and you are at increased risk for chlamydia or gonorrhea. Ask your health care provider if you are at risk.  Ask your health care provider about whether you are at high risk for HIV. Your health care provider may recommend a prescription medicine to help prevent HIV infection. If you choose to take medicine to prevent HIV, you should first get tested for HIV. You should then be tested every 3 months for as long as you are taking the medicine. Follow these instructions at home: Lifestyle  Do not use any products that contain nicotine or tobacco, such as cigarettes, e-cigarettes, and chewing tobacco. If you need help quitting, ask your health care provider.  Do not use street drugs.  Do not share needles.  Ask your health care provider for help if you need support or information about quitting drugs. Alcohol use  Do not drink alcohol if your health care provider tells you not to drink.  If you drink alcohol: ? Limit how much you have to 0-2 drinks a day. ? Be aware of how much alcohol is in your drink. In the [place] one drink equals one 12 oz bottle of beer (355 mL), one 5 oz glass of wine (148 mL), or one 1 oz glass of hard liquor (44 mL). General instructions  Schedule regular health, dental, and eye exams.  Stay current with your vaccines.  Tell your health care provider if: ? You often feel depressed. ? You have ever been abused or do not feel safe at home. Summary  Adopting a healthy lifestyle and getting preventive care are important in promoting health and wellness.  Follow your health care  provider's instructions about healthy diet, exercising, and getting tested or screened for diseases.  Follow your health care provider's instructions on monitoring your cholesterol and blood pressure. This information is not intended to replace advice given to you by your health care provider. Make sure you discuss any questions you have with your health care provider. Document Revised: 06/06/2018 Document Reviewed: 06/06/2018 Elsevier Patient Education  2020 [employer].

## 2019-07-15 NOTE — Progress Notes (Signed)
I acted as a Neurosurgeon for [employer], PA-C Corky Mull, LPN  Subjective:    Ryan Rowe is a 34 y.o. male and is here for a comprehensive physical exam.  HPI  There are no preventive care reminders to display for this patient.  Acute Concerns: Cerumen impaction -- bilateral ears. History of this. Has not tried anything to help with her symptoms.  Chronic Issues: HTN Currently taking Lisinopril 20 mg. At home blood pressure readings are: <140/90. Patient denies chest pain, SOB, blurred vision, dizziness, unusual headaches, lower leg swelling. Patient is compliant with medication. Denies excessive caffeine intake, stimulant usage, excessive alcohol intake, or increase in salt consumption.  BP Readings from Last 3 Encounters:  07/15/19 (!) 132/96  01/16/19 130/90  07/18/18 120/82   DM Not taking metformin regularly, because he can't always find time to eat and knows that he needs to take it with food.   Health Maintenance: Immunizations -- UTD Colonoscopy --N/A PSA --N/A Diet -- eats a wide variety of food groups Sleep habits -- denies issues Exercise -- none scheduled currently Weight -- Weight: 264 lb 6.1 oz (119.9 kg)  Weight history Wt Readings from Last 10 Encounters:  07/15/19 264 lb 6.1 oz (119.9 kg)  01/16/19 259 lb (117.5 kg)  07/18/18 246 lb 6.1 oz (111.8 kg)  07/13/18 245 lb 8 oz (111.4 kg)  07/03/18 245 lb 11.2 oz (111.4 kg)  05/01/18 240 lb 4.8 oz (109 kg)  04/16/18 240 lb 4 oz (109 kg)  03/27/18 239 lb (108.4 kg)  03/26/18 239 lb (108.4 kg)  03/22/18 246 lb (111.6 kg)  Body mass index is 37.4 kg/m. Mood -- good Tobacco use -- none Alcohol use --- no excessive intake  Depression screen PHQ 2/9 07/15/2019  Decreased Interest 0  Down, Depressed, Hopeless 0  PHQ - 2 Score 0     Other providers/specialists: Patient Care Team: Jarold Motto, [place] as PCP - General (Physician Assistant)   PMHx, SurgHx, SocialHx, Medications, and  Allergies were reviewed in the Visit Navigator and updated as appropriate.   Past Medical History:  Diagnosis Date  . GERD (gastroesophageal reflux disease)   . Hypertension   . Pre-diabetes      Past Surgical History:  Procedure Laterality Date  . FRACTURE SURGERY Right    wrist     Family History  Problem Relation Age of Onset  . Depression Mother   . Diabetes Mother        controlled w/o medication  . Hypertension Father   . Diabetes Other   . Cancer Maternal Aunt        breast  . ALS Maternal Grandfather   . Diabetes Maternal Grandfather   . Cancer Maternal Aunt        breast  . Heart disease Cousin 50  . Hypertension Paternal Grandmother   . Hypertension Paternal Grandfather   . Stroke Neg Hx     Social History   Tobacco Use  . Smoking status: Never Smoker  . Smokeless tobacco: Never Used  Substance Use Topics  . Alcohol use: Yes    Comment: Socially  . Drug use: No    Review of Systems:   Review of Systems  Constitutional: Negative for chills, fever, malaise/fatigue and weight loss.  HENT: Negative for hearing loss, sinus pain and sore throat.   Respiratory: Negative for cough and hemoptysis.   Cardiovascular: Negative for chest pain, palpitations, leg swelling and PND.  Gastrointestinal: Negative for abdominal pain, constipation,  diarrhea, heartburn, nausea and vomiting.  Genitourinary: Negative for dysuria, frequency and urgency.  Musculoskeletal: Negative for back pain, myalgias and neck pain.  Skin: Negative for itching and rash.  Neurological: Negative for dizziness, tingling, seizures and headaches.  Endo/Heme/Allergies: Negative for polydipsia.  Psychiatric/Behavioral: Negative for depression. The patient is not nervous/anxious.     Objective:   Vitals:   07/15/19 1042  BP: (!) 132/96  Pulse: 77  Temp: 98.7 F (37.1 C)  SpO2: 97%   Body mass index is 37.4 kg/m.  General Appearance:  Alert, cooperative, no distress, appears  stated age  Head:  Normocephalic, without obvious abnormality, atraumatic  Eyes:  PERRL, conjunctiva/corneas clear, EOM's intact, fundi benign, both eyes       Ears:  Bilateral ears with cerumen impaction  Nose: Nares normal, septum midline, mucosa normal, no drainage    or sinus tenderness  Throat: Lips, mucosa, and tongue normal; teeth and gums normal  Neck: Supple, symmetrical, trachea midline, no adenopathy; thyroid:  No enlargement/tenderness/nodules; no carotit bruit or JVD  Back:   Symmetric, no curvature, ROM normal, no CVA tenderness  Lungs:   Clear to auscultation bilaterally, respirations unlabored  Chest wall:  No tenderness or deformity  Heart:  Regular rate and rhythm, S1 and S2 normal, no murmur, rub   or gallop  Abdomen:   Soft, non-tender, bowel sounds active all four quadrants, no masses, no organomegaly  Extremities: Extremities normal, atraumatic, no cyanosis or edema  Prostate: Not done.   Skin: Skin color, texture, turgor normal, no rashes or lesions  Lymph nodes: Cervical, supraclavicular, and axillary nodes normal  Neurologic: CNII-XII grossly intact. Normal strength, sensation and reflexes throughout   Procedure: Cerumen Disimpaction Warm water was applied and gentle ear lavage performed on the bilateral sides. There were no complications and following the disimpaction the tympanic membrane was visible on both sides. Tympanic membranes are intact following the procedure.  Auditory canals are normal.  The patient reported relief of symptoms after removal of cerumen.    Assessment/Plan:   Ryan Rowe was seen today for annual exam.  Diagnoses and all orders for this visit:  Routine physical examination Today patient counseled on age appropriate routine health concerns for screening and prevention, each reviewed and up to date or declined. Immunizations reviewed and up to date or declined. Labs ordered and reviewed. Risk factors for depression reviewed and negative.  Hearing function and visual acuity are intact. ADLs screened and addressed as needed. Functional ability and level of safety reviewed and appropriate. Education, counseling and referrals performed based on assessed risks today. Patient provided with a copy of personalized plan for preventive services.  Essential hypertension Controlled at home per patient. Continue current medication of Lisinopril 20 mg daily. Follow-up in 6 months, sooner if any worsening symptoms. -     CBC with Differential/Platelet -     Comprehensive metabolic panel  Pre-diabetes Not always compliant with metformin. Will update HgbA1c and advice based on results. -     Hemoglobin A1c  Encounter for lipid screening for cardiovascular disease -     Lipid panel  Obesity (BMI 35.0-39.9 without comorbidity) Continue to work on diet and exercise. Has access to a gym and a track, working on motivation to use it.  Bilateral impacted cerumen Tolerated procedure well in office.  Well Adult Exam: Labs ordered: Yes. Patient counseling was done. See below for items discussed. Discussed the patient's BMI.  The BMI is not in the acceptable range; BMI management plan  is completed Follow up in 6 months.  Patient Counseling: [x]   Nutrition: Stressed importance of moderation in sodium/caffeine intake, saturated fat and cholesterol, caloric balance, sufficient intake of fresh fruits, vegetables, and fiber.  [x]   Stressed the importance of regular exercise.   []   Substance Abuse: Discussed cessation/primary prevention of tobacco, alcohol, or other drug use; driving or other dangerous activities under the influence; availability of treatment for abuse.   [x]   Injury prevention: Discussed safety belts, safety helmets, smoke detector, smoking near bedding or upholstery.   []   Sexuality: Discussed sexually transmitted diseases, partner selection, use of condoms, avoidance of unintended pregnancy  and contraceptive alternatives.   [x]   Dental  health: Discussed importance of regular tooth brushing, flossing, and dental visits.  [x]   Health maintenance and immunizations reviewed. Please refer to Health maintenance section.    CMA or LPN served as scribe during this visit. History, Physical, and Plan performed by medical provider. The above documentation has been reviewed and is accurate and complete.  Inda Coke, PA-C [place]

## 2019-08-19 ENCOUNTER — Ambulatory Visit: Payer: BC Managed Care – PPO

## 2019-09-22 ENCOUNTER — Other Ambulatory Visit: Payer: Self-pay | Admitting: Physician Assistant

## 2019-11-13 ENCOUNTER — Ambulatory Visit (INDEPENDENT_AMBULATORY_CARE_PROVIDER_SITE_OTHER): Payer: BC Managed Care – PPO | Admitting: Physician Assistant

## 2019-11-13 ENCOUNTER — Encounter: Payer: Self-pay | Admitting: Physician Assistant

## 2019-11-13 ENCOUNTER — Other Ambulatory Visit: Payer: Self-pay

## 2019-11-13 ENCOUNTER — Telehealth: Payer: Self-pay | Admitting: Physician Assistant

## 2019-11-13 VITALS — BP 140/90 | HR 72 | Temp 98.3°F | Ht 70.5 in | Wt 263.4 lb

## 2019-11-13 DIAGNOSIS — G43809 Other migraine, not intractable, without status migrainosus: Secondary | ICD-10-CM

## 2019-11-13 MED ORDER — ONDANSETRON HCL 4 MG PO TABS: 4.0000 mg | ORAL_TABLET | Freq: Three times a day (TID) | ORAL | 0 refills | Status: DC | PRN

## 2019-11-13 MED ORDER — KETOROLAC TROMETHAMINE 60 MG/2ML IM SOLN
60.0000 mg | Freq: Once | INTRAMUSCULAR | Status: AC
  Administered 2019-11-13: 60 mg via INTRAMUSCULAR

## 2019-11-13 NOTE — Progress Notes (Signed)
Ryan Rowe is a 34 y.o. male here for a new problem.  I acted as a Neurosurgeon for [employer], PA-C [employer], [place]  History of Present Illness:   Chief Complaint  Patient presents with  . Dizziness    HPI  Dizziness Pt c/o of dizziness that started yesterday. Symptoms worsened around 11 o'clock last night when he was sitting up in bed and turned his head quickly. Headaches in front and back of head. BP checked yesterday at home and was <140/90. Compliant with blood pressure medications. Taking lisinopril 20 mg daily. Denies SOB, chest pain, LE edema. Has had some light sensitivity.    Past Medical History:  Diagnosis Date  . GERD (gastroesophageal reflux disease)   . Hypertension   . Pre-diabetes      Social History   Socioeconomic History  . Marital status: Single    Spouse name: Not on file  . Number of children: Not on file  . Years of education: Not on file  . Highest education level: Not on file  Occupational History  . Not on file  Tobacco Use  . Smoking status: Never Smoker  . Smokeless tobacco: Never Used  Substance and Sexual Activity  . Alcohol use: Yes    Comment: Socially  . Drug use: No  . Sexual activity: Yes  Other Topics Concern  . 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  Exercise - regularly. Diet - healthy.      Social Determinants of Health   Financial Resource Strain:   . Difficulty of Paying Living Expenses:   Food Insecurity:   . Worried About Programme researcher, broadcasting/film/video in the Last Year:   . Barista in the Last Year:   Transportation Needs:   . Freight forwarder (Medical):   Marland Kitchen Lack of Transportation (Non-Medical):   Physical Activity:   . Days of Exercise per Week:   . Minutes of Exercise per Session:   Stress:   . Feeling of Stress :   Social Connections:   . Frequency of Communication with Friends and Family:   . Frequency of Social  Gatherings with Friends and Family:   . Attends Religious Services:   . Active Member of Clubs or Organizations:   . Attends Banker Meetings:   Marland Kitchen Marital Status:   Intimate Partner Violence:   . Fear of Current or Ex-Partner:   . Emotionally Abused:   Marland Kitchen Physically Abused:   . Sexually Abused:     Past Surgical History:  Procedure Laterality Date  . FRACTURE SURGERY Right    wrist    Family History  Problem Relation Age of Onset  . Depression Mother   . Diabetes Mother        controlled w/o medication  . Hypertension Father   . Diabetes Other   . Cancer Maternal Aunt        breast  . ALS Maternal Grandfather   . Diabetes Maternal Grandfather   . Cancer Maternal Aunt        breast  . Heart disease Cousin 50  . Hypertension Paternal Grandmother   . Hypertension Paternal Grandfather   . Stroke Neg Hx     No Known Allergies  Current Medications:   Current Outpatient Medications:  .  albuterol (PROVENTIL HFA;VENTOLIN HFA) 108 (90 Base) MCG/ACT inhaler, Inhale 2 puffs into the lungs every 4 (four)  hours as needed for wheezing or shortness of breath (cough, shortness of breath or wheezing.)., Disp: 1 Inhaler, Rfl: 6 .  Fexofenadine HCl (ALLEGRA ALLERGY PO), Take 1 tablet by mouth daily as needed (allergies)., Disp: , Rfl:  .  hydrOXYzine (ATARAX/VISTARIL) 25 MG tablet, Take 1 tablet (25 mg total) by mouth at bedtime as needed for anxiety (insomnia). Take one 25 mg tablet as needed for anxiety. May increase to 2 tabs., Disp: 60 tablet, Rfl: 0 .  lisinopril (ZESTRIL) 20 MG tablet, TAKE 1 TABLET BY MOUTH EVERY DAY, Disp: 90 tablet, Rfl: 0 .  metFORMIN (GLUCOPHAGE) 500 MG tablet, Take 1 tablet (500 mg total) by mouth daily with breakfast., Disp: 90 tablet, Rfl: 2 .  Butalbital-APAP-Caffeine 50-300-40 MG CAPS, Take 1 capsule by mouth every 6 (six) hours. (Patient not taking: Reported on 11/13/2019), Disp: 30 capsule, Rfl: 1 .  ondansetron (ZOFRAN) 4 MG tablet, Take 1  tablet (4 mg total) by mouth every 8 (eight) hours as needed for nausea or vomiting., Disp: 20 tablet, Rfl: 0   Review of Systems:   ROS  Vitals:   Vitals:   11/13/19 0906  BP: 140/90  Pulse: 72  Temp: 98.3 F (36.8 C)  TempSrc: Temporal  SpO2: 96%  Weight: 263 lb 6.4 oz (119.5 kg)  Height: 5' 10.5" (1.791 m)     Body mass index is 37.26 kg/m.  Physical Exam:   Physical Exam Vitals and nursing note reviewed.  Constitutional:      General: He is not in acute distress.    Appearance: He is well-developed. He is not ill-appearing or toxic-appearing.  Cardiovascular:     Rate and Rhythm: Normal rate and regular rhythm.     Pulses: Normal pulses.     Heart sounds: Normal heart sounds, S1 normal and S2 normal.     Comments: No LE edema Pulmonary:     Effort: Pulmonary effort is normal.     Breath sounds: Normal breath sounds.  Skin:    General: Skin is warm and dry.  Neurological:     General: No focal deficit present.     Mental Status: He is alert.     GCS: GCS eye subscore is 4. GCS verbal subscore is 5. GCS motor subscore is 6.     Cranial Nerves: Cranial nerves are intact.     Sensory: Sensation is intact.     Motor: Motor function is intact.     Coordination: Coordination is intact.  Psychiatric:        Speech: Speech normal.        Behavior: Behavior normal. Behavior is cooperative.         Assessment and Plan:   Ryan Rowe was seen today for dizziness.  Diagnoses and all orders for this visit:  Other migraine without status migrainosus, not intractable No red flags on discussion or exam. Neuro exam normal. Patient received toradol injection and tolerated without issues. Oral zofran script sent. Worsening precautions advised. If dizziness persists, will send to vestibular rehab for further evaluation. -     ketorolac (TORADOL) injection 60 mg  Other orders -     ondansetron (ZOFRAN) 4 MG tablet; Take 1 tablet (4 mg total) by mouth every 8 (eight) hours as  needed for nausea or vomiting.  . Reviewed expectations re: course of current medical issues. . Discussed self-management of symptoms. . Outlined signs and symptoms indicating need for more acute intervention. . Patient verbalized understanding and all questions were answered. Marland Kitchen  See orders for this visit as documented in the electronic medical record. . Patient received an After-Visit Summary.  CMA or LPN served as scribe during this visit. History, Physical, and Plan performed by medical provider. The above documentation has been reviewed and is accurate and complete.   Inda Coke, PA-C

## 2019-11-13 NOTE — Telephone Encounter (Signed)
Nurse Assessment Nurse: Ladona Ridgel, RN, Clydie Braun Date/Time Ryan Rowe Time): 11/13/2019 6:15:21 AM Confirm and document reason for call. If symptomatic, describe symptoms. ---He states that he has dizziness, few headaches. He is able to ambulate, has light sensitivity. No fever. Has the patient had close contact with a person known or suspected to have the novel coronavirus illness OR traveled / lives in area with major community spread (including international travel) in the last 14 days from the onset of symptoms? * If Asymptomatic, screen for exposure and travel within the last 14 days. ---No Does the patient have any new or worsening symptoms? ---Yes Will a triage be completed? ---Yes Related visit to physician within the last 2 weeks? ---No Does the PT have any chronic conditions? (i.e. diabetes, asthma, this includes High risk factors for pregnancy, etc.) ---No Is this a behavioral health or substance abuse call? ---No Guidelines Guideline Title Affirmed Question Affirmed Notes Nurse Date/Time (Eastern Time) Dizziness - Lightheadedness [1] MODERATE dizziness (e.g., interferes with normal activities) AND [2] has NOT been evaluated by physician for this (Exception: dizziness caused by heat exposure, sudden standing, or poor fluid intake) Ladona Ridgel, RN, Clydie Braun 11/13/2019 6:18:04 AMPLEASE NOTE: All timestamps contained within this report are represented as [place] Standard Time. CONFIDENTIALTY NOTICE: This fax transmission is intended only for the addressee. It contains information that is legally privileged, confidential or otherwise protected from use or disclosure. If you are not the intended recipient, you are strictly prohibited from reviewing, disclosing, copying using or disseminating any of this information or taking any action in reliance on or regarding this information. If you have received this fax in error, please notify us immediately by telephone so that we can arrange for its  return to [place]. Phone: [phone], Toll-Free: [phone], Fax: [phone] Page: 2 of 2 Call Id: [id number] Disp. Time Ryan Rowe Time) Disposition Final User 11/13/2019 6:20:23 AM See PCP within 24 Hours Yes Ladona Ridgel, RN, Pablo Ledger Disagree/Comply Comply Caller Understands Yes PreDisposition Go to Urgent Care/Walk-In Clinic

## 2019-11-13 NOTE — Patient Instructions (Signed)
It was great to see you!  Please let me know how you are doing later today. If your spinning persists, I will get you to a physical therapist that can correct your dizziness later this week.  Contact a doctor if:  Your symptoms are not helped by medicine.  You have a headache that feels different than the other headaches.  You feel sick to your stomach (nauseous) or you throw up (vomit).  You have a fever. Get help right away if:  Your headache gets very bad quickly.  Your headache gets worse after a lot of physical activity.  You keep throwing up.  You have a stiff neck.  You have trouble seeing.  You have trouble speaking.  You have pain in the eye or ear.  Your muscles are weak or you lose muscle control.  You lose your balance or have trouble walking.  You feel like you will pass out (faint) or you pass out.  You are mixed up (confused).  You have a seizure.

## 2019-11-13 NOTE — Telephone Encounter (Signed)
Please call pt and schedule an appt with Samantha. 

## 2019-11-13 NOTE — Telephone Encounter (Signed)
Patient is scheduled for 11/13/19 @ 9am

## 2019-11-14 ENCOUNTER — Ambulatory Visit: Payer: BC Managed Care – PPO

## 2019-12-07 IMAGING — CR DG CHEST 2V
2 series · 2 of 2 positions shown · non-contrast
Comparison: Chest radiograph dated 12/26/2017

CLINICAL DATA: 32-year-old male with chest pain.

EXAM:
CHEST - 2 VIEW

[w chest pa]
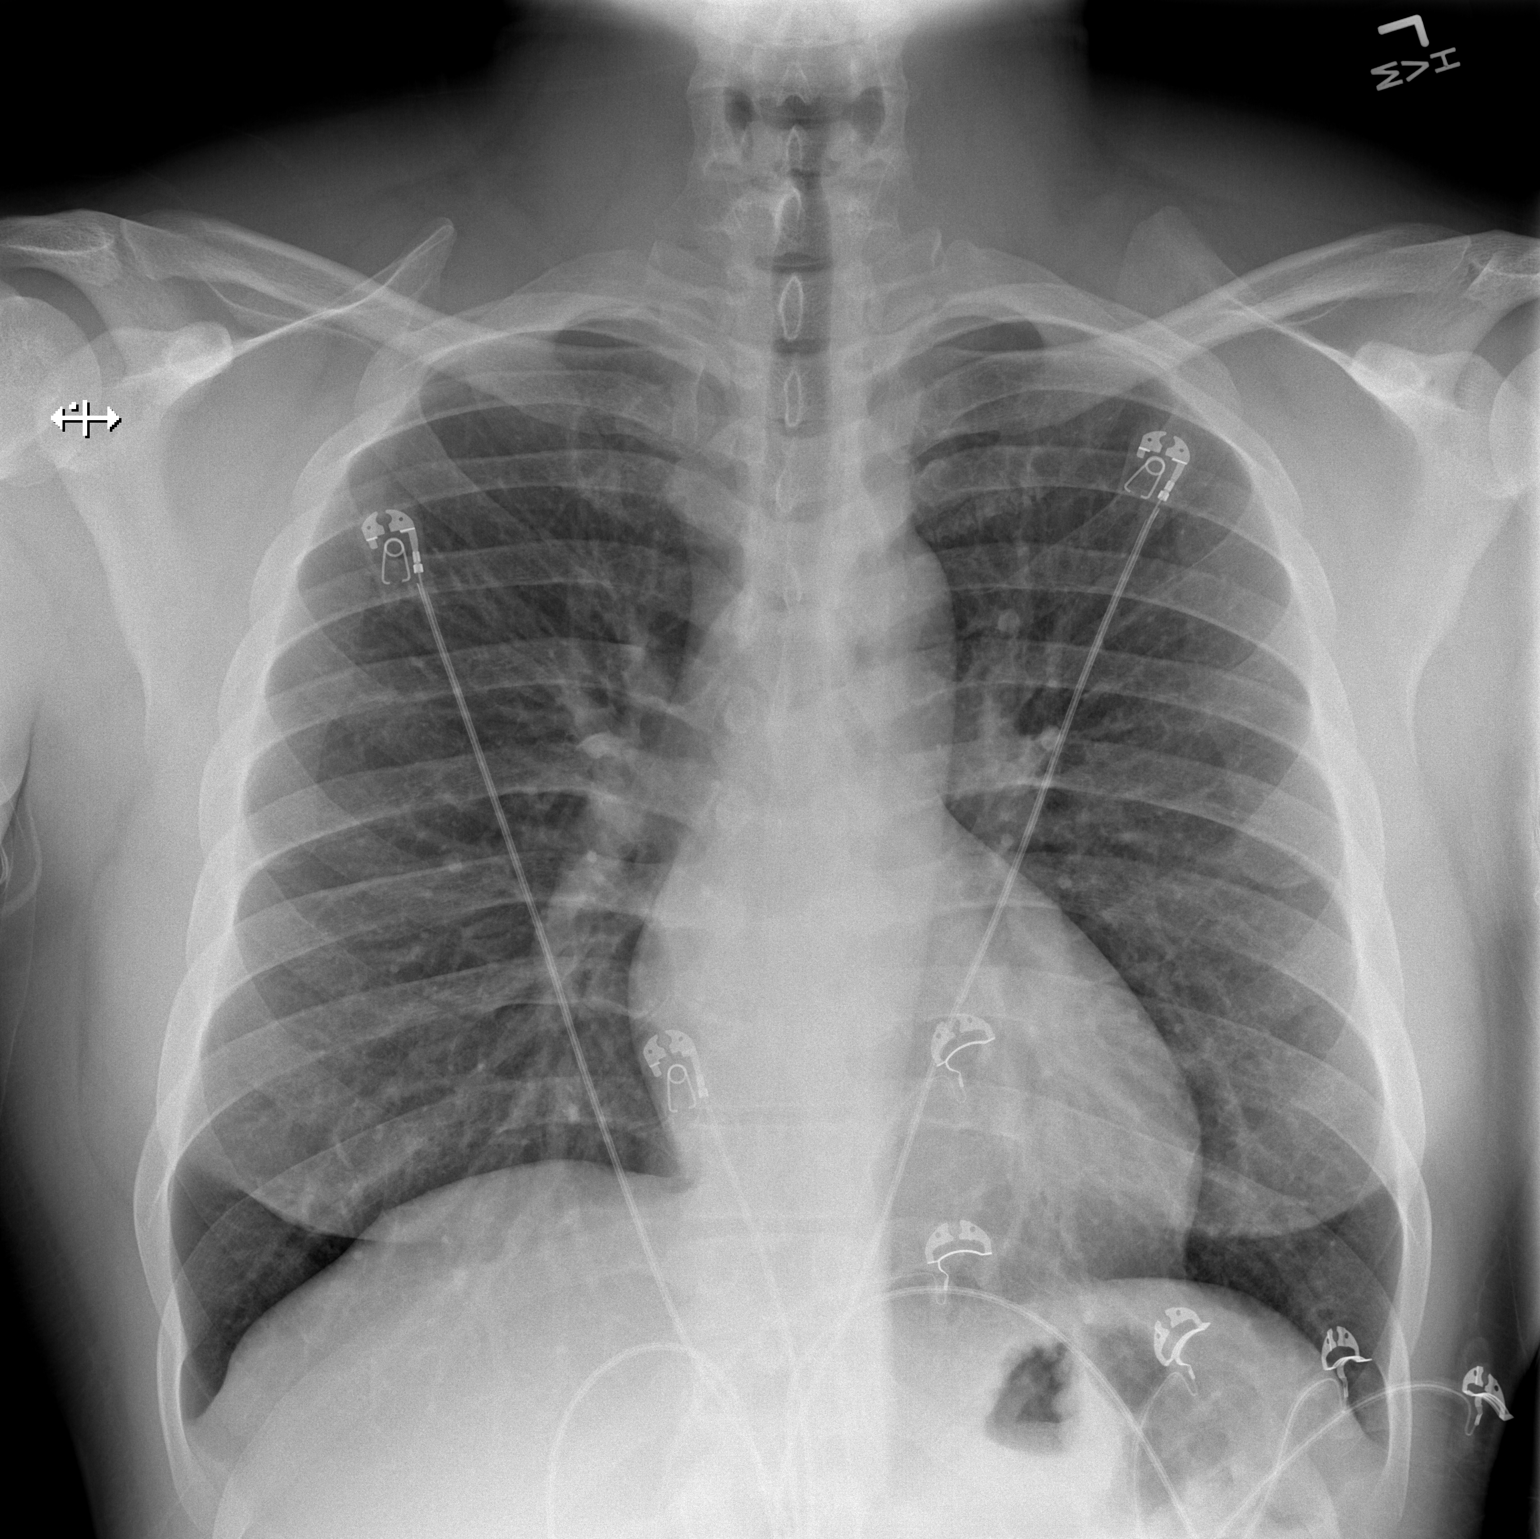

[w chest lat]
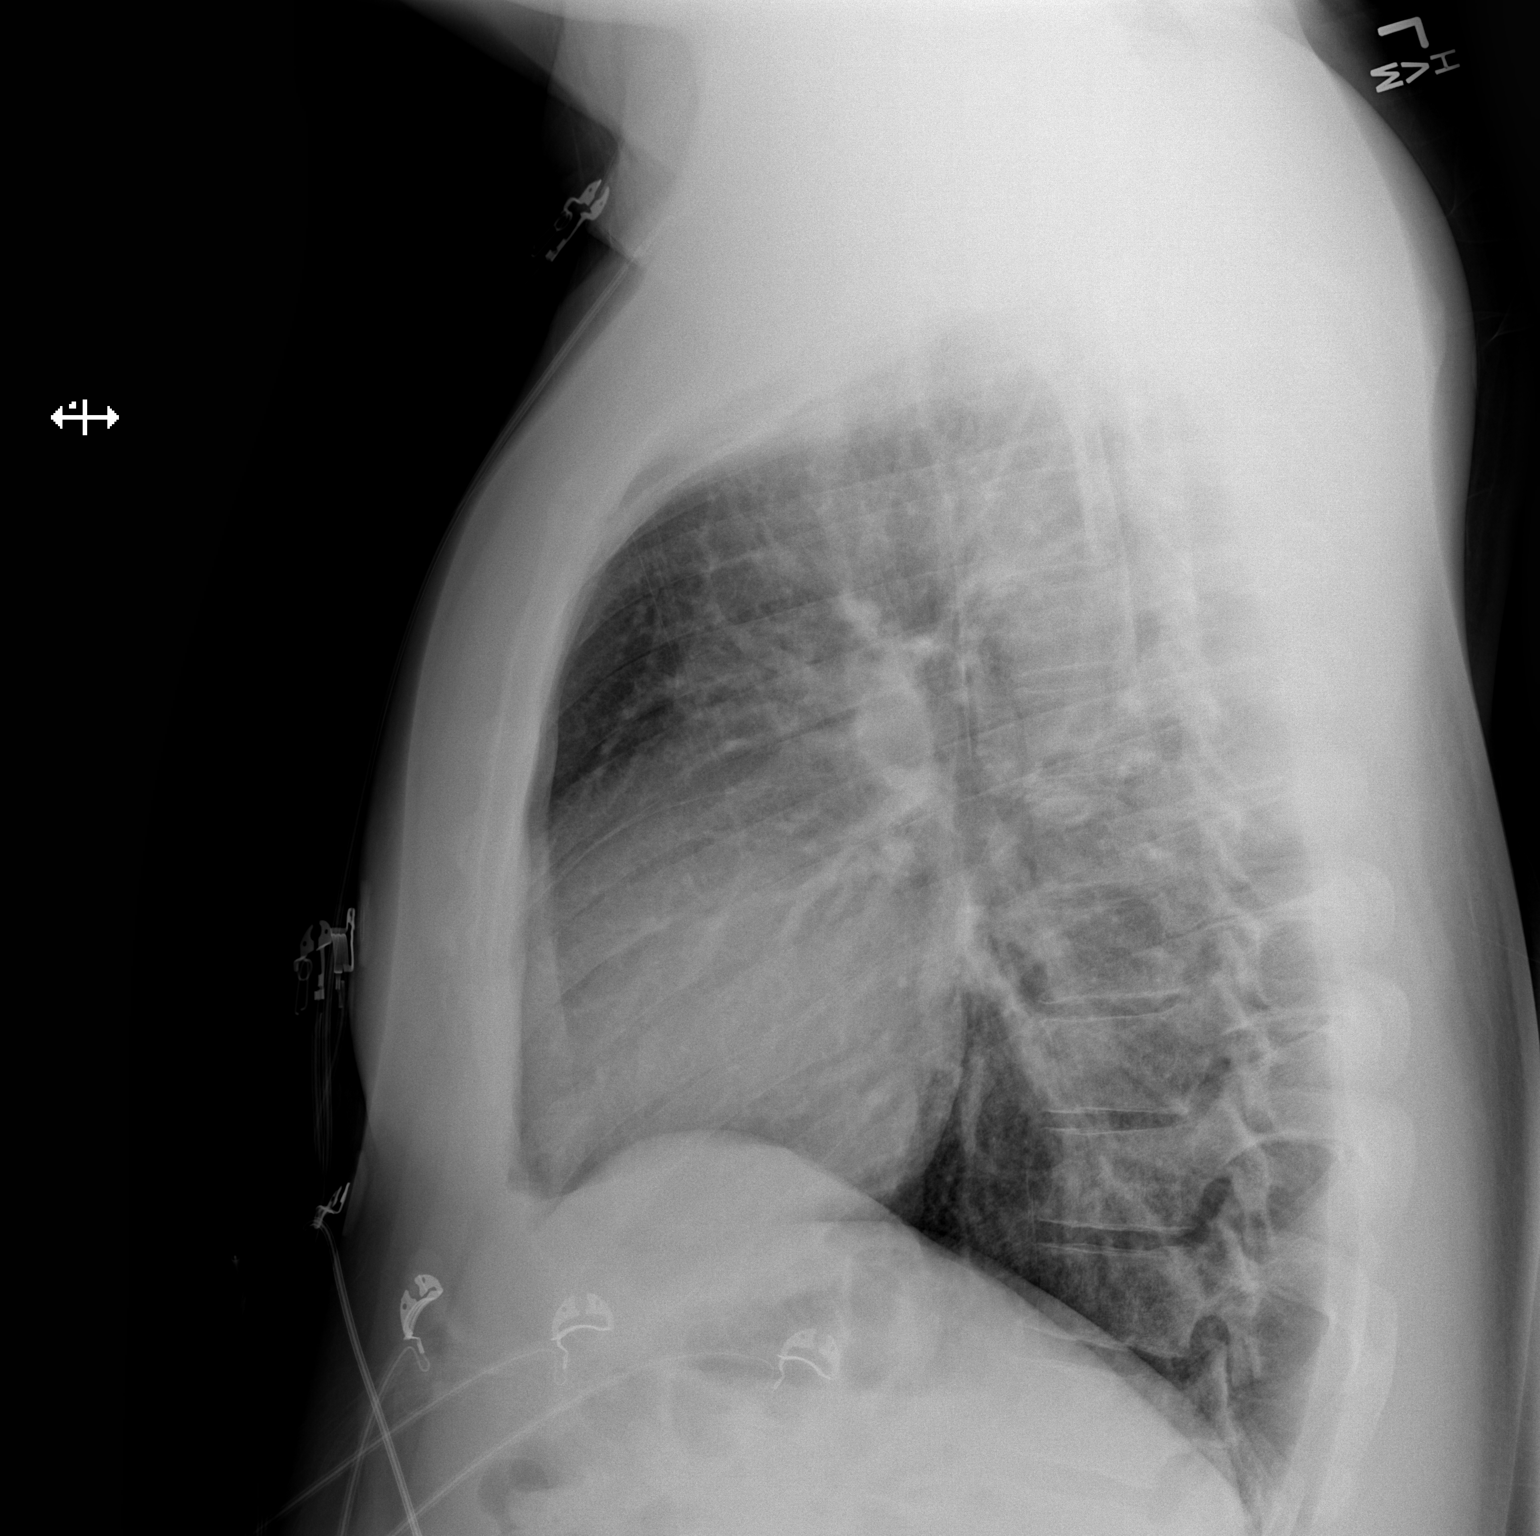

[2 of 2 positions shown; findings below may reference images not displayed]

FINDINGS: The heart size and mediastinal contours are within normal limits.
Both lungs are clear. The visualized skeletal structures are
unremarkable.
IMPRESSION: No active cardiopulmonary disease.

## 2019-12-12 IMAGING — DX DG CERVICAL SPINE COMPLETE 4+V
6 series · 6 of 6 positions shown · non-contrast
Comparison: None.

CLINICAL DATA: Chronic neck pain radiating down the left arm. No
known injury

EXAM:
CERVICAL SPINE - COMPLETE 4+ VIEW

[cervical spine lat (1 of 2)]
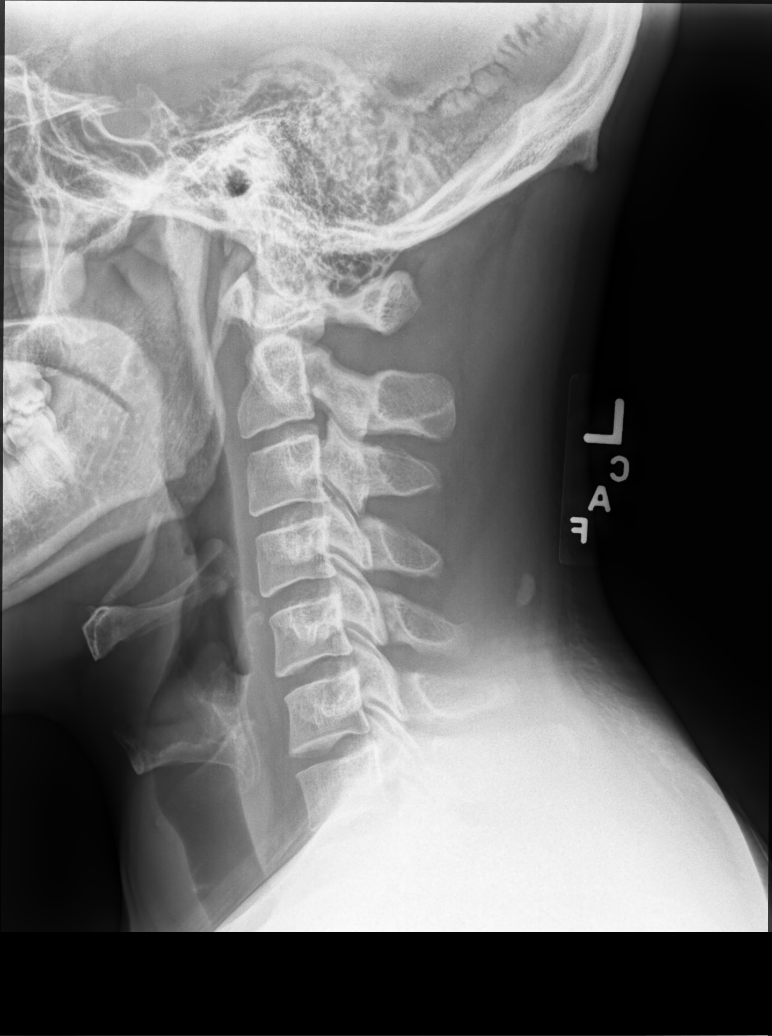

[cervical spine oblique (1 of 2)]
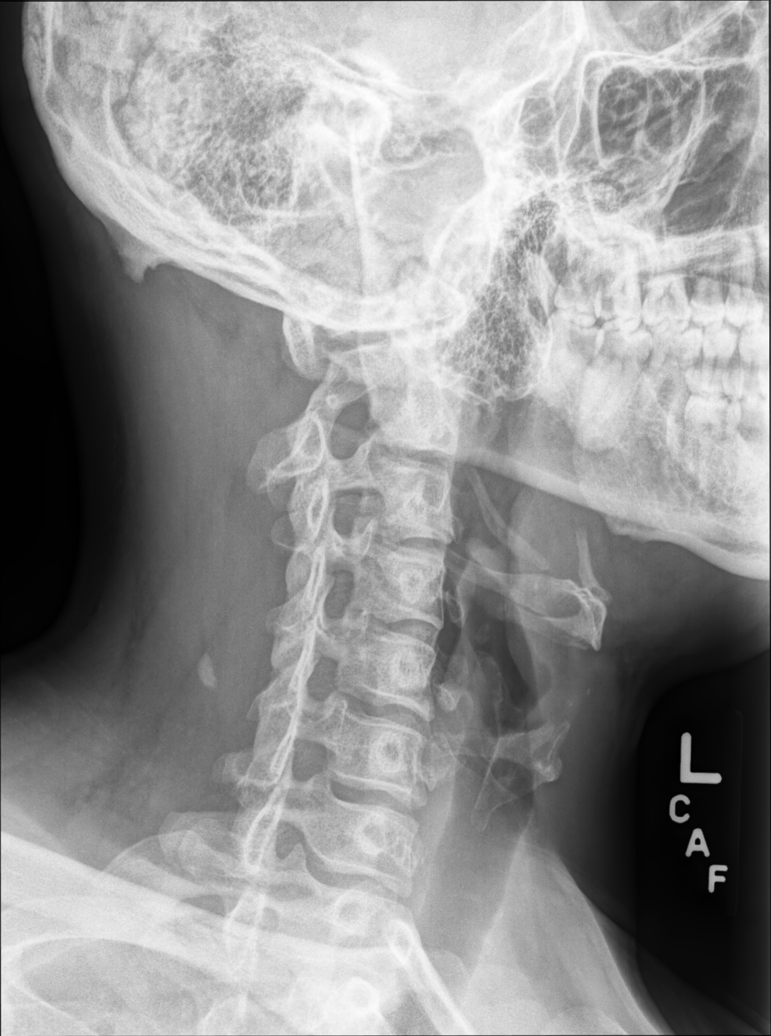

[cervical spine oblique (2 of 2)]
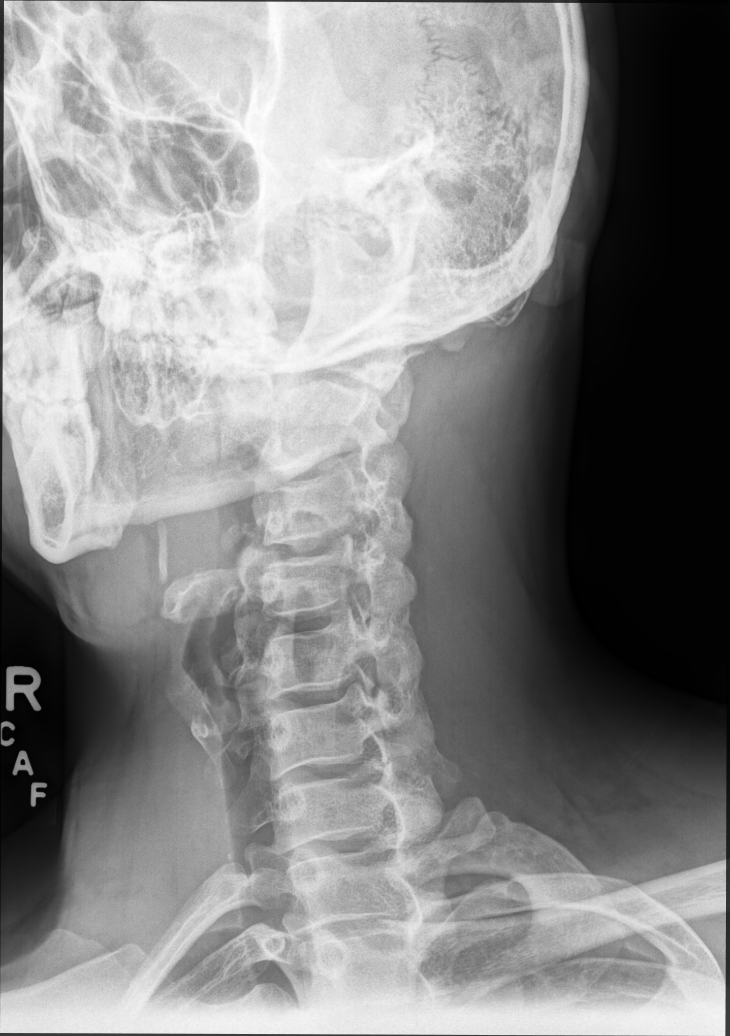

[cervical spine ap (1 of 2)]
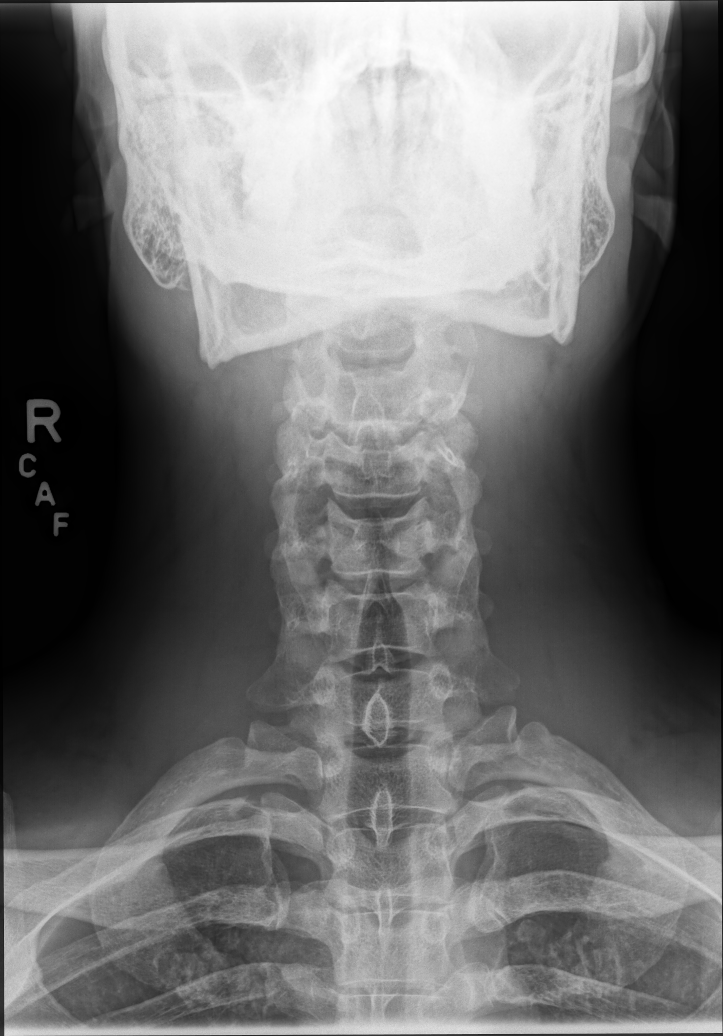

[cervical spine ap (2 of 2)]
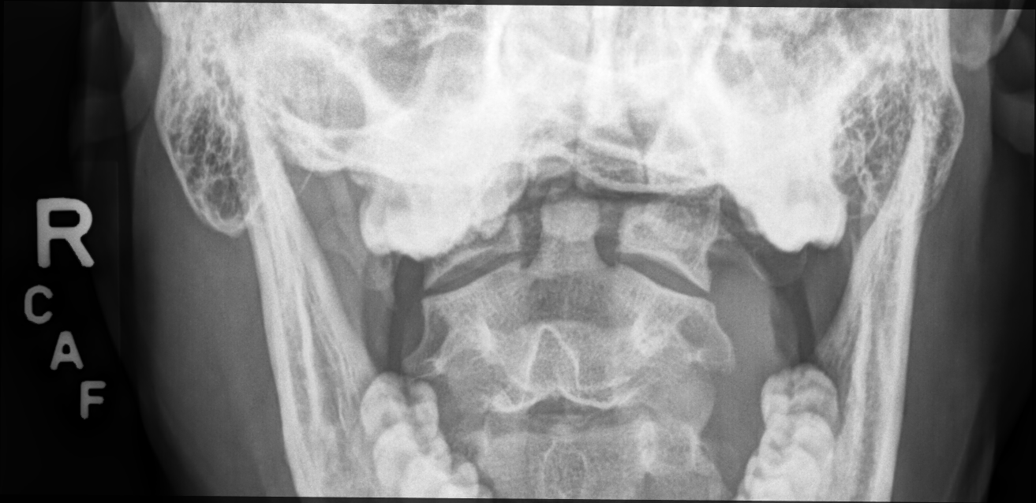

[cervical spine lat (2 of 2)]
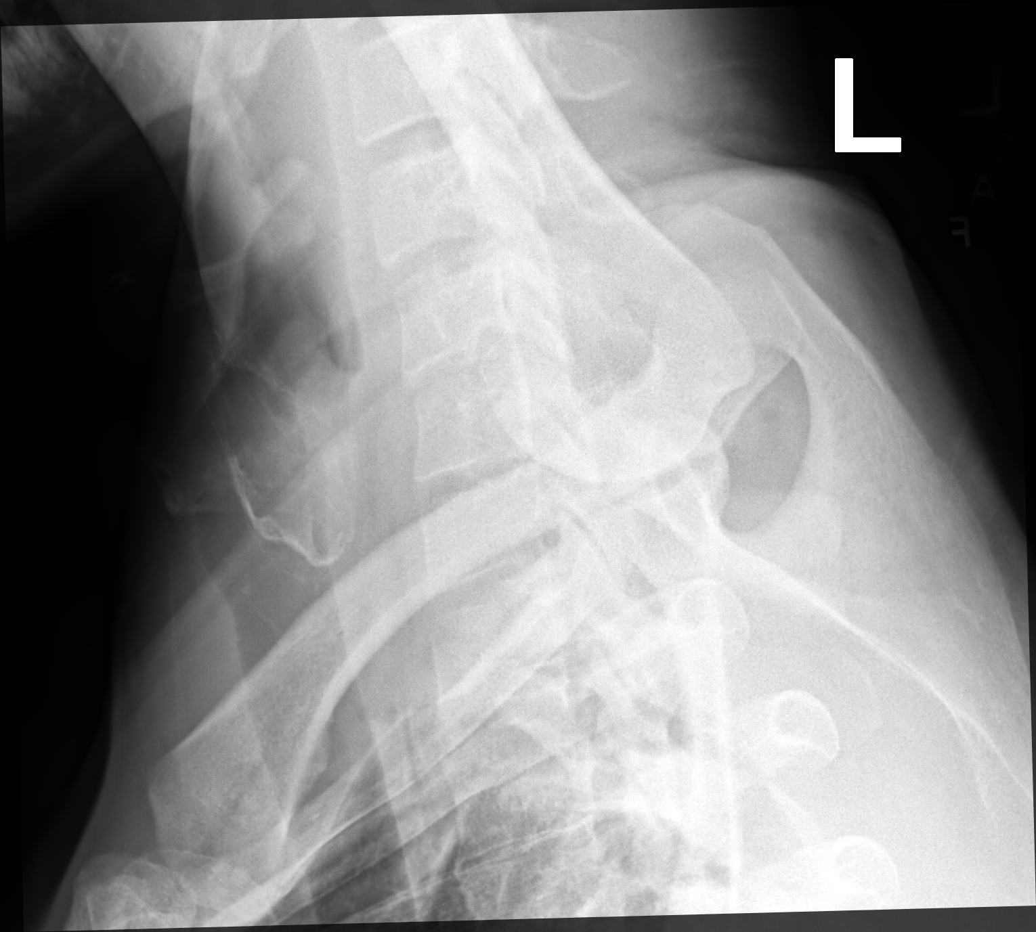

[6 of 6 positions shown; findings below may reference images not displayed]

FINDINGS: There is mild loss of the normal cervical lordosis. The cervical
vertebral bodies are preserved in height. The disc space heights are
well maintained. The odontoid is intact. There is no spinous process
fracture. The prevertebral soft tissue spaces are normal. The neural
foramina on the right appear widely patent. Those on the left cannot
be adequately assessed due to suboptimal positioning of the patient
for this view.
IMPRESSION: There is no acute or significant chronic bony abnormality of the
visualized portions of the cervical spine. Mild loss of the normal
cervical lordosis may reflect muscle spasm.

## 2019-12-22 ENCOUNTER — Other Ambulatory Visit: Payer: Self-pay | Admitting: Physician Assistant

## 2019-12-24 ENCOUNTER — Ambulatory Visit: Payer: BC Managed Care – PPO

## 2019-12-24 DIAGNOSIS — Z20822 Contact with and (suspected) exposure to covid-19: Secondary | ICD-10-CM | POA: Diagnosis not present

## 2019-12-24 DIAGNOSIS — J0100 Acute maxillary sinusitis, unspecified: Secondary | ICD-10-CM | POA: Diagnosis not present

## 2019-12-24 DIAGNOSIS — J3089 Other allergic rhinitis: Secondary | ICD-10-CM | POA: Diagnosis not present

## 2019-12-24 DIAGNOSIS — Z03818 Encounter for observation for suspected exposure to other biological agents ruled out: Secondary | ICD-10-CM | POA: Diagnosis not present

## 2019-12-31 ENCOUNTER — Ambulatory Visit: Payer: BC Managed Care – PPO

## 2020-01-07 ENCOUNTER — Ambulatory Visit: Payer: BC Managed Care – PPO | Attending: Family

## 2020-01-07 DIAGNOSIS — Z23 Encounter for immunization: Secondary | ICD-10-CM

## 2020-01-07 NOTE — Progress Notes (Signed)
   Covid-19 Vaccination Clinic  Name:  Ryan Rowe    MRN: [id number] DOB: 05-09-1986  01/07/2020  [name] was observed post Covid-19 immunization for 15 minutes without incident. He was provided with Vaccine Information Sheet and instruction to access the V-Safe system.   [name] was instructed to call 911 with any severe reactions post vaccine: Marland Kitchen Difficulty breathing  . Swelling of face and throat  . A fast heartbeat  . A bad rash all over body  . Dizziness and weakness   Immunizations Administered    Name Date Dose VIS Date Route   Pfizer COVID-19 Vaccine 01/07/2020  2:04 PM 0.3 mL 08/21/2018 Intramuscular   Manufacturer: [employer], [employer]   Lot: [id number]   NDC: [address]-0

## 2020-01-13 DIAGNOSIS — Z20828 Contact with and (suspected) exposure to other viral communicable diseases: Secondary | ICD-10-CM | POA: Diagnosis not present

## 2020-01-13 DIAGNOSIS — Z1383 Encounter for screening for respiratory disorder NEC: Secondary | ICD-10-CM | POA: Diagnosis not present

## 2020-01-28 ENCOUNTER — Ambulatory Visit: Payer: BC Managed Care – PPO

## 2020-01-28 DIAGNOSIS — Z1383 Encounter for screening for respiratory disorder NEC: Secondary | ICD-10-CM | POA: Diagnosis not present

## 2020-01-28 DIAGNOSIS — Z20828 Contact with and (suspected) exposure to other viral communicable diseases: Secondary | ICD-10-CM | POA: Diagnosis not present

## 2020-03-28 ENCOUNTER — Other Ambulatory Visit: Payer: Self-pay | Admitting: Physician Assistant

## 2020-07-05 ENCOUNTER — Other Ambulatory Visit: Payer: Self-pay | Admitting: Physician Assistant

## 2020-08-10 ENCOUNTER — Encounter: Payer: BC Managed Care – PPO | Admitting: Physician Assistant

## 2020-08-10 DIAGNOSIS — Z0289 Encounter for other administrative examinations: Secondary | ICD-10-CM

## 2020-08-18 ENCOUNTER — Encounter: Payer: Self-pay | Admitting: Physician Assistant

## 2020-11-26 ENCOUNTER — Other Ambulatory Visit: Payer: Self-pay | Admitting: Physician Assistant

## 2021-01-27 ENCOUNTER — Telehealth (INDEPENDENT_AMBULATORY_CARE_PROVIDER_SITE_OTHER): Payer: BC Managed Care – PPO | Admitting: Family Medicine

## 2021-01-27 ENCOUNTER — Encounter: Payer: Self-pay | Admitting: Family Medicine

## 2021-01-27 DIAGNOSIS — Z209 Contact with and (suspected) exposure to unspecified communicable disease: Secondary | ICD-10-CM

## 2021-01-27 MED ORDER — METRONIDAZOLE 500 MG PO TABS: 500.0000 mg | ORAL_TABLET | Freq: Two times a day (BID) | ORAL | 0 refills | Status: AC

## 2021-01-27 NOTE — Progress Notes (Signed)
Subjective:    Patient ID: Ryan Rowe, male    DOB: 03/24/1986, 35 y.o.   MRN: [id number]  HPI Virtual Visit via Video Note  I connected with the patient on 01/27/21 at  4:00 PM EDT by a video enabled telemedicine application and verified that I am speaking with the correct person using two identifiers.  Location patient: home Location provider:work or home office Persons participating in the virtual visit: patient, provider  I discussed the limitations of evaluation and management by telemedicine and the availability of in person appointments. The patient expressed understanding and agreed to proceed.   HPI: Here for exposure to Trichomonas. He visited a former girlfriend in [place] last weekend, and they had intercourse twice. He did not use a condom. Several days ago she developed swelling and itching in the vaginal area. She went to a medical clinic this morning, and she was diagnosed with Trichomonas. She was started on treatment. Currently Tasha has no symptoms at all.    ROS: See pertinent positives and negatives per HPI.  Past Medical History:  Diagnosis Date   GERD (gastroesophageal reflux disease)    Hypertension    Pre-diabetes     Past Surgical History:  Procedure Laterality Date   FRACTURE SURGERY Right    wrist    Family History  Problem Relation Age of Onset   Depression Mother    Diabetes Mother        controlled w/o medication   Hypertension Father    Diabetes Other    Cancer Maternal Aunt        breast   ALS Maternal Grandfather    Diabetes Maternal Grandfather    Cancer Maternal Aunt        breast   Heart disease Cousin 50   Hypertension Paternal Grandmother    Hypertension Paternal Grandfather    Stroke Neg Hx      Current Outpatient Medications:    albuterol (PROVENTIL HFA;VENTOLIN HFA) 108 (90 Base) MCG/ACT inhaler, Inhale 2 puffs into the lungs every 4 (four) hours as needed for wheezing or shortness of breath (cough, shortness of  breath or wheezing.)., Disp: 1 Inhaler, Rfl: 6   Butalbital-APAP-Caffeine 50-300-40 MG CAPS, Take 1 capsule by mouth every 6 (six) hours., Disp: 30 capsule, Rfl: 1   Fexofenadine HCl (ALLEGRA ALLERGY PO), Take 1 tablet by mouth daily as needed (allergies)., Disp: , Rfl:    hydrOXYzine (ATARAX/VISTARIL) 25 MG tablet, Take 1 tablet (25 mg total) by mouth at bedtime as needed for anxiety (insomnia). Take one 25 mg tablet as needed for anxiety. May increase to 2 tabs., Disp: 60 tablet, Rfl: 0   lisinopril (ZESTRIL) 20 MG tablet, TAKE 1 TABLET BY MOUTH EVERY DAY, Disp: 90 tablet, Rfl: 0   metFORMIN (GLUCOPHAGE) 500 MG tablet, Take 1 tablet (500 mg total) by mouth daily with breakfast., Disp: 90 tablet, Rfl: 2   metroNIDAZOLE (FLAGYL) 500 MG tablet, Take 1 tablet (500 mg total) by mouth 2 (two) times daily for 7 days., Disp: 14 tablet, Rfl: 0   ondansetron (ZOFRAN) 4 MG tablet, Take 1 tablet (4 mg total) by mouth every 8 (eight) hours as needed for nausea or vomiting., Disp: 20 tablet, Rfl: 0  EXAM:  VITALS per patient if applicable:  GENERAL: alert, oriented, appears well and in no acute distress  HEENT: atraumatic, conjunttiva clear, no obvious abnormalities on inspection of external nose and ears  NECK: normal movements of the head and neck  LUNGS: on inspection  no signs of respiratory distress, breathing rate appears normal, no obvious gross SOB, gasping or wheezing  CV: no obvious cyanosis  MS: moves all visible extremities without noticeable abnormality  PSYCH/NEURO: pleasant and cooperative, no obvious depression or anxiety, speech and thought processing grossly intact  ASSESSMENT AND PLAN: Exposure to Trichomonas, we will treat with 7 days of Metronidazole. Follow up as needed. Gershon Crane, MD  Discussed the following assessment and plan:  No diagnosis found.     I discussed the assessment and treatment plan with the patient. The patient was provided an opportunity to ask  questions and all were answered. The patient agreed with the plan and demonstrated an understanding of the instructions.   The patient was advised to call back or seek an in-person evaluation if the symptoms worsen or if the condition fails to improve as anticipated.      Review of Systems     Objective:   Physical Exam        Assessment & Plan:

## 2021-02-27 ENCOUNTER — Other Ambulatory Visit: Payer: Self-pay | Admitting: Family Medicine

## 2021-03-18 ENCOUNTER — Telehealth (INDEPENDENT_AMBULATORY_CARE_PROVIDER_SITE_OTHER): Payer: Self-pay | Admitting: Family Medicine

## 2021-03-18 VITALS — Ht 70.5 in | Wt 262.0 lb

## 2021-03-18 DIAGNOSIS — J301 Allergic rhinitis due to pollen: Secondary | ICD-10-CM

## 2021-03-18 DIAGNOSIS — H938X1 Other specified disorders of right ear: Secondary | ICD-10-CM

## 2021-03-18 NOTE — Progress Notes (Signed)
Virtual Visit via Video Note  Subjective  CC:  Chief Complaint  Patient presents with   Sore Throat    Taking mucinex and feeling better    Ear Fullness     I connected with [name] on 03/18/21 at  8:15 AM EDT by a video enabled telemedicine application and verified that I am speaking with the correct person using two identifiers. Location patient: Home Location provider: [facility] [address], Office Persons participating in the virtual visit: Navin Dogan, Willow Ora, MD Jacolyn Reedy, CMA  I discussed the limitations of evaluation and management by telemedicine and the availability of in person appointments. The patient expressed understanding and agreed to proceed. HPI: Ryan Rowe is a 35 y.o. male who was contacted today to address the problems listed above in the chief complaint. Right ear feels clogged for four days. Ear wax removal hasn't helped. Prior, had a mild sore throat about 3 days w/ mild nasal congestion. Has allergies w/ sneezing. No fevers, malaise, cough, slight headache. Negative covid test 2 days ago. Not taking any medications. He does have h/o cerumen impactions.   Assessment  1. Seasonal allergic rhinitis due to pollen   2. Clogged ear, right      Plan  Seasonal allergies vs URI w/ clogged ear:  discussed possibility of mild covid infection, other viral infection vs allergies. Could also have cerumen impaction. To test for covid again to be certain. If negative, supportive care and start allegra and flonase (pt has both at home). If not improving, need in person OV to check for cerumen impaction.   I discussed the assessment and treatment plan with the patient. The patient was provided an opportunity to ask questions and all were answered. The patient agreed with the plan and demonstrated an understanding of the instructions.   The patient was advised to call back or seek an in-person evaluation if the symptoms  worsen or if the condition fails to improve as anticipated. Follow up: prn  Visit date not found  No orders of the defined types were placed in this encounter.     I reviewed the patients updated PMH, FH, and SocHx.    Patient Active Problem List   Diagnosis Date Noted   Alpha thalassemia minor 05/01/2018   Elevated red blood cell count 05/01/2018   Vitamin D deficiency 04/16/2018   Essential hypertension 12/27/2017   Bilateral impacted cerumen 12/27/2017   Mild intermittent asthma without complication 09/29/2016   Pre-diabetes 09/29/2016   Obesity (BMI 35.0-39.9 without comorbidity) 09/29/2016   GERD (gastroesophageal reflux disease) 02/21/2015   Current Meds  Medication Sig   lisinopril (ZESTRIL) 20 MG tablet TAKE 1 TABLET BY MOUTH EVERY DAY    Allergies: Patient has No Known Allergies. Family History: Patient family history includes ALS in his maternal grandfather; Cancer in his maternal aunt and maternal aunt; Depression in his mother; Diabetes in his maternal grandfather, mother, and another family member; Heart disease (age of onset: 53) in his cousin; Hypertension in his father, paternal grandfather, and paternal grandmother. Social History:  Patient  reports that he has never smoked. He has never used smokeless tobacco. He reports current alcohol use. He reports that he does not use drugs.  Review of Systems: Constitutional: Negative for fever malaise or anorexia Cardiovascular: negative for chest pain Respiratory: negative for SOB or persistent cough Gastrointestinal: negative for abdominal pain  OBJECTIVE Vitals: Ht 5' 10.5" (1.791 m)  Wt 262 lb (118.8 kg)   BMI 37.06 kg/m  General: no acute distress , A&Ox3 Mild nasal congestion  Willow Ora, MD

## 2021-05-26 ENCOUNTER — Ambulatory Visit: Payer: No Typology Code available for payment source | Admitting: Physician Assistant

## 2021-05-26 ENCOUNTER — Encounter: Payer: Self-pay | Admitting: Physician Assistant

## 2021-05-26 ENCOUNTER — Other Ambulatory Visit: Payer: Self-pay

## 2021-05-26 VITALS — BP 136/90 | HR 83 | Temp 98.3°F | Ht 70.5 in | Wt 291.4 lb

## 2021-05-26 DIAGNOSIS — Z136 Encounter for screening for cardiovascular disorders: Secondary | ICD-10-CM

## 2021-05-26 DIAGNOSIS — R7303 Prediabetes: Secondary | ICD-10-CM | POA: Diagnosis not present

## 2021-05-26 DIAGNOSIS — I10 Essential (primary) hypertension: Secondary | ICD-10-CM | POA: Diagnosis not present

## 2021-05-26 DIAGNOSIS — Z13220 Encounter for screening for lipoid disorders: Secondary | ICD-10-CM

## 2021-05-26 DIAGNOSIS — Z23 Encounter for immunization: Secondary | ICD-10-CM | POA: Diagnosis not present

## 2021-05-26 LAB — COMPREHENSIVE METABOLIC PANEL
ALT: 37 U/L (ref 0–53)
AST: 25 U/L (ref 0–37)
Albumin: 4.7 g/dL (ref 3.5–5.2)
Alkaline Phosphatase: 52 U/L (ref 39–117)
BUN: 13 mg/dL (ref 6–23)
CO2: 28 mEq/L (ref 19–32)
Calcium: 9.9 mg/dL (ref 8.4–10.5)
Chloride: 101 mEq/L (ref 96–112)
Creatinine, Ser: 1.15 mg/dL (ref 0.40–1.50)
GFR: 82.24 mL/min (ref >60.00)
Glucose, Bld: 105 mg/dL — ABNORMAL HIGH (ref 70–99)
Potassium: 4.5 mEq/L (ref 3.5–5.1)
Sodium: 136 mEq/L (ref 135–145)
Total Bilirubin: 0.7 mg/dL (ref 0.2–1.2)
Total Protein: 8 g/dL (ref 6.0–8.3)

## 2021-05-26 LAB — CBC WITH DIFFERENTIAL/PLATELET
Basophils Absolute: 0 10*3/uL (ref 0.0–0.1)
Basophils Relative: 0.9 % (ref 0.0–3.0)
Eosinophils Absolute: 0 10*3/uL (ref 0.0–0.7)
Eosinophils Relative: 0.8 % (ref 0.0–5.0)
HCT: 44.7 % (ref 39.0–52.0)
Hemoglobin: 14.2 g/dL (ref 13.0–17.0)
Lymphocytes Relative: 32.3 % (ref 12.0–46.0)
Lymphs Abs: 1.6 10*3/uL (ref 0.7–4.0)
MCHC: 31.7 g/dL (ref 30.0–36.0)
MCV: 70.1 fl — ABNORMAL LOW (ref 78.0–100.0)
Monocytes Absolute: 0.5 10*3/uL (ref 0.1–1.0)
Monocytes Relative: 9.8 % (ref 3.0–12.0)
Neutro Abs: 2.9 10*3/uL (ref 1.4–7.7)
Neutrophils Relative %: 56.2 % (ref 43.0–77.0)
Platelets: 289 10*3/uL (ref 150.0–400.0)
RBC: 6.37 Mil/uL — ABNORMAL HIGH (ref 4.22–5.81)
RDW: 14.8 % (ref 11.5–15.5)
WBC: 5.1 10*3/uL (ref 4.0–10.5)

## 2021-05-26 LAB — LIPID PANEL
Cholesterol: 196 mg/dL (ref 0–200)
HDL: 35.1 mg/dL — ABNORMAL LOW (ref >39.00)
LDL Cholesterol: 147 mg/dL — ABNORMAL HIGH (ref 0–99)
NonHDL: 161.08
Total CHOL/HDL Ratio: 6
Triglycerides: 71 mg/dL (ref 0.0–149.0)
VLDL: 14.2 mg/dL (ref 0.0–40.0)

## 2021-05-26 LAB — HEMOGLOBIN A1C: Hgb A1c MFr Bld: 6.4 % (ref 4.6–6.5)

## 2021-05-26 MED ORDER — OZEMPIC (0.25 OR 0.5 MG/DOSE) 2 MG/1.5ML ~~LOC~~ SOPN
0.2500 mg | PEN_INJECTOR | SUBCUTANEOUS | 0 refills | Status: DC
Start: 2021-05-26 — End: 2021-07-07

## 2021-05-26 NOTE — Progress Notes (Signed)
Ryan Rowe is a 35 y.o. male here for a follow up of hypertension and obesity.  History of Present Illness:   Chief Complaint  Patient presents with   Hypertension   Obesity    HPI  HTN Patient denies chest pain, SOB, blurred vision, dizziness, unusual headaches, lower leg swelling. Patient is compliant with lisinopril 20 mg daily with no adverse effects. Denies excessive caffeine intake, stimulant usage, excessive alcohol intake, or increase in salt consumption.  BP Readings from Last 3 Encounters:  05/26/21 136/90  11/13/19 140/90  07/15/19 (!) 132/96   Obesity Ryan Rowe states that following his breakup with his previous partner of three years he began to eat more without realizing it. Upon noticing that he was becoming more fatigue upon exertion, he weighed himself and found he was 298 lbs. Following this, he began making healthier dietary choices as well as increasing his exercising. At this time he is interested in receiving help to get him back to at least 220 lbs.   Wt Readings from Last 3 Encounters:  05/26/21 291 lb 6.1 oz (132.2 kg)  03/18/21 262 lb (118.8 kg)  11/13/19 263 lb 6.4 oz (119.5 kg)   Pre-Diabetes  Ryan Rowe admits that he hasn't been compliant with his metformin 500 mg daily for about a year and a half. Due to seeing how family members and the rest of his community has undiagnosed health concerns, it really motivated him to take his health more seriously. He is interested in restarting medication if need, but would like to just adjust his diet and exercise.    Past Medical History:  Diagnosis Date   GERD (gastroesophageal reflux disease)    Hypertension    Pre-diabetes      Social History   Tobacco Use   Smoking status: Never   Smokeless tobacco: Never  Substance Use Topics   Alcohol use: Yes    Comment: Socially   Drug use: No    Past Surgical History:  Procedure Laterality Date   FRACTURE SURGERY Right    wrist    Family History   Problem Relation Age of Onset   Depression Mother    Diabetes Mother        controlled w/o medication   Hypertension Father    Diabetes Other    Cancer Maternal Aunt        breast   ALS Maternal Grandfather    Diabetes Maternal Grandfather    Cancer Maternal Aunt        breast   Heart disease Cousin 50   Hypertension Paternal Grandmother    Hypertension Paternal Grandfather    Stroke Neg Hx     No Known Allergies  Current Medications:   Current Outpatient Medications:    lisinopril (ZESTRIL) 20 MG tablet, TAKE 1 TABLET BY MOUTH EVERY DAY, Disp: 90 tablet, Rfl: 0   Review of Systems:   ROS Negative unless otherwise specified per HPI. Vitals:   Vitals:   05/26/21 1032  BP: 136/90  Pulse: 83  Temp: 98.3 F (36.8 C)  TempSrc: Temporal  SpO2: 96%  Weight: 291 lb 6.1 oz (132.2 kg)  Height: 5' 10.5" (1.791 m)     Body mass index is 41.22 kg/m.  Physical Exam:   Physical Exam Vitals and nursing note reviewed.  Constitutional:      General: He is not in acute distress.    Appearance: He is well-developed. He is not ill-appearing or toxic-appearing.  Cardiovascular:  Rate and Rhythm: Normal rate and regular rhythm.     Pulses: Normal pulses.     Heart sounds: Normal heart sounds, S1 normal and S2 normal.  Pulmonary:     Effort: Pulmonary effort is normal.     Breath sounds: Normal breath sounds.  Skin:    General: Skin is warm and dry.  Neurological:     Mental Status: He is alert.     GCS: GCS eye subscore is 4. GCS verbal subscore is 5. GCS motor subscore is 6.  Psychiatric:        Speech: Speech normal.        Behavior: Behavior normal. Behavior is cooperative.    Assessment and Plan:   Essential hypertension Controlled Continue lisinopril 20 mg daily  Monitor BP regularly at home Update lytes/renal function today Advised patient if BP reads >150/100 to reach out to our office and medication will be adjusted  accordingly  Pre-diabetes Suspect uncontrolled due to weight gain and medication non-compliance Start Ozempic 0.25 mg weekly injection -- sample given, recommend that he let us know in 2-4 weeks if he would like to continue this medication Will update labs today, adjust medication as indicated by lab results  Encouraged continued regular exercise and improvement of diet  Encounter for lipid screening for cardiovascular disease Will complete lipid panel today   Need for Influenza Vaccination Completed today    I,Ryan Rowe,acting as a scribe for [employer], PA.,have documented all relevant documentation on the behalf of Ryan Motto, PA,as directed by  Ryan Motto, PA while in the presence of Ryan Rowe, [place].  I, Ryan Rowe, [place], have reviewed all documentation for this visit. The documentation on 05/26/21 for the exam, diagnosis, procedures, and orders are all accurate and complete.  Time spent with patient today was 25 minutes which consisted of chart review, discussing diagnosis, work up, treatment answering questions and documentation.    Ryan Motto, PA-C

## 2021-05-26 NOTE — Patient Instructions (Signed)
It was great to see you!  Start ozempic 0.25 mg weekly Let me know in about 4 weeks after taking the medication if you would like this  Continue lisinopril 20 mg daily  Let's follow-up in 3 months, sooner if you have concerns.  Take care,  Jarold Motto PA-C

## 2021-06-14 ENCOUNTER — Other Ambulatory Visit: Payer: Self-pay | Admitting: Physician Assistant

## 2021-07-07 ENCOUNTER — Telehealth: Payer: Self-pay | Admitting: Physician Assistant

## 2021-07-07 MED ORDER — OZEMPIC (0.25 OR 0.5 MG/DOSE) 2 MG/1.5ML ~~LOC~~ SOPN: 0.2500 mg | PEN_INJECTOR | SUBCUTANEOUS | 0 refills | Status: DC

## 2021-07-12 MED ORDER — OZEMPIC (0.25 OR 0.5 MG/DOSE) 2 MG/1.5ML ~~LOC~~ SOPN: 0.5000 mg | PEN_INJECTOR | SUBCUTANEOUS | 2 refills | Status: DC

## 2021-07-12 NOTE — Telephone Encounter (Signed)
Patient is requesting script to be sent to [facility] on [address].  Please follow up with patient in regard at [phone].

## 2021-07-12 NOTE — Addendum Note (Signed)
Addended by: Marian Sorrow on: 07/12/2021 10:46 AM   Modules accepted: Orders

## 2021-07-12 NOTE — Telephone Encounter (Signed)
Spoke to pt told him Rx for Ozempic was sent to [facility] as requested. Pt verbalized understanding.

## 2021-07-13 ENCOUNTER — Telehealth: Payer: Self-pay | Admitting: *Deleted

## 2021-07-13 NOTE — Telephone Encounter (Signed)
(  Key: [id number]) Rx #: [id number] Ozempic (0.25 or 0.5 MG/DOSE) 2MG /1.5ML pen-injectors Waiting for determination

## 2021-07-14 ENCOUNTER — Telehealth: Payer: Self-pay | Admitting: *Deleted

## 2021-07-14 MED ORDER — METFORMIN HCL ER 750 MG PO TB24: 750.0000 mg | ORAL_TABLET | Freq: Every day | ORAL | 2 refills | Status: DC

## 2021-07-14 NOTE — Telephone Encounter (Signed)
Spoke to pt told him Ozempic was denied by insurance. Aldona Bar said we can just use Metformin if you are agreeable to this at this time. Pt verbalized understanding and said okay and asked how much would Ozempic be out of pocket? Told him I am not sure you would have to check with the pharmacy. Pt verbalized understanding. Told him will send Rx for Metformin XR 750 mg one tablet daily to the pharmacy. Pt verbalized understanding.

## 2021-07-14 NOTE — Telephone Encounter (Signed)
[id number]. OZEMPIC INJ 2/1.5ML is denied for not meeting the prior authorization requirement(

## 2021-09-17 ENCOUNTER — Other Ambulatory Visit: Payer: Self-pay | Admitting: Physician Assistant

## 2021-09-24 ENCOUNTER — Telehealth (INDEPENDENT_AMBULATORY_CARE_PROVIDER_SITE_OTHER): Payer: No Typology Code available for payment source | Admitting: Physician Assistant

## 2021-09-24 ENCOUNTER — Encounter: Payer: Self-pay | Admitting: Physician Assistant

## 2021-09-24 VITALS — Ht 70.5 in | Wt 284.0 lb

## 2021-09-24 DIAGNOSIS — E669 Obesity, unspecified: Secondary | ICD-10-CM

## 2021-09-24 DIAGNOSIS — R7303 Prediabetes: Secondary | ICD-10-CM

## 2021-09-24 DIAGNOSIS — I10 Essential (primary) hypertension: Secondary | ICD-10-CM

## 2021-09-24 MED ORDER — WEGOVY 0.25 MG/0.5ML ~~LOC~~ SOAJ: 0.2500 mg | SUBCUTANEOUS | 1 refills | Status: DC

## 2021-09-24 MED ORDER — LISINOPRIL 20 MG PO TABS: 20.0000 mg | ORAL_TABLET | Freq: Every day | ORAL | 1 refills | Status: DC

## 2021-09-24 NOTE — Progress Notes (Signed)
? ?Virtual Visit via Video Note  ? ?IJarold Rowe, connected with  Gabe Glace  ([id number], 1985/10/20) on 09/24/21 at 10:20 AM EDT by a video-enabled telemedicine application and verified that I am speaking with the correct person using two identifiers. ? ?Location: ?Patient: Home ?Provider: [facility] [place] office ?  ?I discussed the limitations of evaluation and management by telemedicine and the availability of in person appointments. The patient expressed understanding and agreed to proceed.   ? ?History of Present Illness: ?Ryan Rowe is a 36 y.o. who identifies as a male who was assigned male at birth, and is being seen today for HTN and obesity/pre-diabetes. ? ?HTN ?Currently taking lisinopril 20 mg. At home blood pressure readings are: not checked. Patient denies chest pain, SOB, blurred vision, dizziness, unusual headaches, lower leg swelling. Patient is compliant with medication. Denies excessive caffeine intake, stimulant usage, excessive alcohol intake, or increase in salt consumption. ? ?BP Readings from Last 3 Encounters:  ?05/26/21 136/90  ?11/13/19 140/90  ?07/15/19 (!) 132/96  ? ? ?Obesity/Pre-Diabetes ?Currently taking metformin xr 750 mg daily. Tolerating well. We initially had him on Ozempic but his insurance did not approve this. He tolerated this well and did like how he felt on this medication and liked that it helped him stay full and lose weight. ? ?Problems:  ?Patient Active Problem List  ? Diagnosis Date Noted  ? Alpha thalassemia minor 05/01/2018  ? Elevated red blood cell count 05/01/2018  ? Vitamin D deficiency 04/16/2018  ? Essential hypertension 12/27/2017  ? Bilateral impacted cerumen 12/27/2017  ? Mild intermittent asthma without complication 09/29/2016  ? Pre-diabetes 09/29/2016  ? Obesity (BMI 35.0-39.9 without comorbidity) 09/29/2016  ? GERD (gastroesophageal reflux disease) 02/21/2015  ?  ?Allergies: No Known Allergies ?Medications:  ?Current Outpatient  Medications:  ?  lisinopril (ZESTRIL) 20 MG tablet, TAKE 1 TABLET BY MOUTH EVERY DAY, Disp: 90 tablet, Rfl: 0 ?  metFORMIN (GLUCOPHAGE-XR) 750 MG 24 hr tablet, Take 1 tablet (750 mg total) by mouth daily with breakfast., Disp: 30 tablet, Rfl: 2 ?  Semaglutide,0.25 or 0.5MG /DOS, (OZEMPIC, 0.25 OR 0.5 MG/DOSE,) 2 MG/1.5ML SOPN, Inject 0.25 mg into the skin once a week., Disp: 1.5 mL, Rfl: 0 ?  Semaglutide,0.25 or 0.5MG /DOS, (OZEMPIC, 0.25 OR 0.5 MG/DOSE,) 2 MG/1.5ML SOPN, Inject 0.5 mg into the skin once a week., Disp: 1.5 mL, Rfl: 2 ? ?Observations/Objective: ?Patient is well-developed, well-nourished in no acute distress.  ?Resting comfortably  at home.  ?Head is normocephalic, atraumatic.  ?No labored breathing.  ?Speech is clear and coherent with logical content.  ?Patient is alert and oriented at baseline.  ? ? ?Assessment and Plan: ?1. Essential hypertension ?Suspect well controlled ?Recommend that he check BP 1-2 times per week ?Will maintain lisinopril 20 mg daily ?Follow-up in 3-6 months, sooner if concerns ? ?2. Obesity, unspecified classification, unspecified obesity type, unspecified whether serious comorbidity present; 3. Pre-diabetes ?Doing well overall but would like to restart GLP-1 if able ?Wegovy 0.25 mg weekly sent in ?Continue Metformin 750 mg xr for now ?Follow-up in 3-6 months, sooner if concerns ? ?Follow Up Instructions: ?I discussed the assessment and treatment plan with the patient. The patient was provided an opportunity to ask questions and all were answered. The patient agreed with the plan and demonstrated an understanding of the instructions.  A copy of instructions were sent to the patient via MyChart unless otherwise noted below.  ? ?The patient was advised to call back or seek  an in-person evaluation if the symptoms worsen or if the condition fails to improve as anticipated. ? ?Ryan Motto, PA ?

## 2021-09-27 ENCOUNTER — Telehealth: Payer: Self-pay | Admitting: *Deleted

## 2021-09-27 NOTE — Telephone Encounter (Signed)
Denial letter placed to be scan on pt chart  ?

## 2021-09-27 NOTE — Telephone Encounter (Signed)
This request was denied because you did not meet the following clinical requirements: ?Did an APPEAL and was denied  ? ?

## 2021-09-27 NOTE — Telephone Encounter (Signed)
(  Key: [id number]) ?Rx #: [id number] ?LFYBOF 0.25MG /0.5ML auto-injectors ?Waiting for determination  ?

## 2021-09-30 ENCOUNTER — Ambulatory Visit: Payer: No Typology Code available for payment source | Admitting: Physician Assistant

## 2021-09-30 ENCOUNTER — Encounter: Payer: Self-pay | Admitting: Physician Assistant

## 2021-09-30 VITALS — BP 147/91 | HR 76 | Temp 98.3°F | Ht 70.5 in | Wt 287.2 lb

## 2021-09-30 DIAGNOSIS — I10 Essential (primary) hypertension: Secondary | ICD-10-CM | POA: Diagnosis not present

## 2021-09-30 DIAGNOSIS — R079 Chest pain, unspecified: Secondary | ICD-10-CM

## 2021-09-30 MED ORDER — LISINOPRIL 40 MG PO TABS: 40.0000 mg | ORAL_TABLET | Freq: Every day | ORAL | 1 refills | Status: DC

## 2021-09-30 NOTE — Progress Notes (Signed)
Ryan Rowe is a 36 y.o. male here for HTN. ? ?History of Present Illness:  ? ?Chief Complaint  ?Patient presents with  ? Hypertension  ?  Pt stated that is head has been hurting for a few days and has been taking his medication as prescribed and his head has not stopped hurting and he has been feeling light headed with slight discomfort in his chest.  ? ? ?HPI ? ?HTN; Chest Pain ?Although Ryan Rowe has remained compliant with taking lisinopril 20 mg daily, he has been experiencing headaches for the past couple of days.  He reports that he ate two fast food meals last evening -- cheeseburger from [employer] and chicken from [employer] and he does not normally eat that much fast food.  ? ?Pt describes headache as constant dull sensation that is accompanied with lightheadedness and slight chest discomfort. Has slight discomfort now, denies any exertional component of symptoms or radiation of pain. ? ?BP Readings from Last 3 Encounters:  ?05/26/21 136/90  ?11/13/19 140/90  ?07/15/19 (!) 132/96  ? ? ?Past Medical History:  ?Diagnosis Date  ? GERD (gastroesophageal reflux disease)   ? Hypertension   ? Pre-diabetes   ? ?  ?Social History  ? ?Tobacco Use  ? Smoking status: Never  ? Smokeless tobacco: Never  ?Substance Use Topics  ? Alcohol use: Yes  ?  Comment: Socially  ? Drug use: No  ? ? ?Past Surgical History:  ?Procedure Laterality Date  ? FRACTURE SURGERY Right   ? wrist  ? ? ?Family History  ?Problem Relation Age of Onset  ? Depression Mother   ? Diabetes Mother   ?     -pre-diabetic  ? Hypertension Father   ? ALS Maternal Grandfather   ? Diabetes Maternal Grandfather   ? Hypertension Paternal Grandmother   ? Hypertension Paternal Grandfather   ? Cancer Maternal Aunt   ?     breast  ? Cancer Maternal Aunt   ?     breast  ? Heart disease Cousin 50  ? Diabetes Other   ? Stroke Neg Hx   ? ? ?No Known Allergies ? ?Current Medications:  ? ?Current Outpatient Medications:  ?  lisinopril (ZESTRIL) 20 MG tablet, Take 1 tablet  (20 mg total) by mouth daily., Disp: 90 tablet, Rfl: 1 ?  metFORMIN (GLUCOPHAGE-XR) 750 MG 24 hr tablet, Take 1 tablet (750 mg total) by mouth daily with breakfast., Disp: 30 tablet, Rfl: 2 ?  Semaglutide-Weight Management (WEGOVY) 0.25 MG/0.5ML SOAJ, Inject 0.25 mg into the skin once a week. (Patient not taking: Reported on 09/30/2021), Disp: 2 mL, Rfl: 1  ? ?Review of Systems:  ? ?ROS ?Negative unless otherwise specified per HPI. ? ?Vitals:  ? ?Vitals:  ? 09/30/21 1128  ?Pulse: 76  ?Temp: 98.3 ?F (36.8 ?C)  ?SpO2: 99%  ?Weight: 287 lb 3.2 oz (130.3 kg)  ?Height: 5' 10.5" (1.791 m)  ?   ?Body mass index is 40.63 kg/m?. ? ?Physical Exam:  ? ?Physical Exam ?Vitals and nursing note reviewed.  ?Constitutional:   ?   General: He is not in acute distress. ?   Appearance: He is well-developed. He is not ill-appearing or toxic-appearing.  ?Cardiovascular:  ?   Rate and Rhythm: Normal rate and regular rhythm.  ?   Pulses: Normal pulses.  ?   Heart sounds: Normal heart sounds, S1 normal and S2 normal.  ?Pulmonary:  ?   Effort: Pulmonary effort is normal.  ?   Breath  sounds: Normal breath sounds.  ?Skin: ?   General: Skin is warm and dry.  ?Neurological:  ?   Mental Status: He is alert.  ?   GCS: GCS eye subscore is 4. GCS verbal subscore is 5. GCS motor subscore is 6.  ?Psychiatric:     ?   Speech: Speech normal.     ?   Behavior: Behavior normal. Behavior is cooperative.  ? ? ?Assessment and Plan:  ? ?Chest pain, unspecified type ?EKG tracing is personally reviewed.  EKG notes NSR.  No acute changes.  ?Recommend avoidance of fast food ?Rest, hydrate ?No exertional component, doubt ACS ?Recommend ER evaluation if new/worsening symptoms ? ?Essential hypertension ?Remains above goal ?Increase lisinopril to 40 mg daily ?Follow-up in 1 month ?Keep log of BP ? ?Jarold Motto, PA-C ? ?

## 2021-09-30 NOTE — Patient Instructions (Signed)
It was great to see you! ? ?EKG is reassuring ? ?Please avoid high salt foods ? ?Let's increase your lisinopril to 40 mg daily. ? ?If worsening symptoms, please go to the ER. ? ?Follow-up in 1 month. ? ?Take care, ? ?Inda Coke PA-C  ?

## 2021-10-05 ENCOUNTER — Encounter: Payer: Self-pay | Admitting: Physician Assistant

## 2021-11-04 ENCOUNTER — Other Ambulatory Visit: Payer: Self-pay | Admitting: Physician Assistant

## 2022-03-21 ENCOUNTER — Encounter: Payer: Self-pay | Admitting: *Deleted

## 2022-05-05 ENCOUNTER — Encounter: Payer: Self-pay | Admitting: Physician Assistant

## 2022-05-05 ENCOUNTER — Telehealth (INDEPENDENT_AMBULATORY_CARE_PROVIDER_SITE_OTHER): Payer: Managed Care, Other (non HMO) | Admitting: Physician Assistant

## 2022-05-05 VITALS — HR 89 | Ht 70.5 in

## 2022-05-05 DIAGNOSIS — U071 COVID-19: Secondary | ICD-10-CM | POA: Diagnosis not present

## 2022-05-05 MED ORDER — NIRMATRELVIR/RITONAVIR (PAXLOVID)TABLET: 3.0000 | ORAL_TABLET | Freq: Two times a day (BID) | ORAL | 0 refills | Status: AC

## 2022-05-05 NOTE — Progress Notes (Signed)
Virtual Visit via Video   I connected with Ryan Rowe on 05/05/22 at  8:40 AM EST by a video enabled telemedicine application and verified that I am speaking with the correct person using two identifiers. Location patient: Home Location provider: [facility], Office Persons participating in the virtual visit: Kue, Fox PA-C  I discussed the limitations of evaluation and management by telemedicine and the availability of in person appointments. The patient expressed understanding and agreed to proceed.  Subjective:   HPI:   COVID-19 Positive Symptom onset: yesterday Travel/contacts: none that he is aware of  Vaccination status: Has had initial series and one booster Testing results: Tested positive yesterday  Patient endorses the following symptoms: Cough, congestion, headache, body aches, low-grade temperature  Patient denies the following symptoms:  chest pain, SOB  Treatments tried: Mucinex, ibuprofen  ROS: See pertinent positives and negatives per HPI.  Patient Active Problem List   Diagnosis Date Noted   Alpha thalassemia minor 05/01/2018   Elevated red blood cell count 05/01/2018   Vitamin D deficiency 04/16/2018   Essential hypertension 12/27/2017   Bilateral impacted cerumen 12/27/2017   Mild intermittent asthma without complication 09/29/2016   Pre-diabetes 09/29/2016   Obesity (BMI 35.0-39.9 without comorbidity) 09/29/2016   GERD (gastroesophageal reflux disease) 02/21/2015    Social History   Tobacco Use   Smoking status: Never   Smokeless tobacco: Never  Substance Use Topics   Alcohol use: Yes    Comment: Socially    Current Outpatient Medications:    lisinopril (ZESTRIL) 40 MG tablet, Take 1 tablet (40 mg total) by mouth daily., Disp: 90 tablet, Rfl: 1   nirmatrelvir/ritonavir EUA (PAXLOVID) 20 x 150 MG & 10 x 100MG  TABS, Take 3 tablets by mouth 2 (two) times daily for 5 days. (Take nirmatrelvir 150 mg two tablets twice  daily for 5 days and ritonavir 100 mg one tablet twice daily for 5 days), Disp: 30 tablet, Rfl: 0   Semaglutide-Weight Management (WEGOVY) 0.25 MG/0.5ML SOAJ, Inject 0.25 mg into the skin once a week., Disp: 2 mL, Rfl: 1   metFORMIN (GLUCOPHAGE-XR) 750 MG 24 hr tablet, TAKE 1 TABLET(750 MG) BY MOUTH DAILY WITH BREAKFAST (Patient not taking: Reported on 05/05/2022), Disp: 30 tablet, Rfl: 2  No Known Allergies  Objective:   VITALS: Per patient if applicable, see vitals. GENERAL: Alert, appears well and in no acute distress. HEENT: Atraumatic, conjunctiva clear, no obvious abnormalities on inspection of external nose and ears. NECK: Normal movements of the head and neck. CARDIOPULMONARY: No increased WOB. Speaking in clear sentences. I:E ratio WNL.  MS: Moves all visible extremities without noticeable abnormality. PSYCH: Pleasant and cooperative, well-groomed. Speech normal rate and rhythm. Affect is appropriate. Insight and judgement are appropriate. Attention is focused, linear, and appropriate.  NEURO: CN grossly intact. Oriented as arrived to appointment on time with no prompting. Moves both UE equally.  SKIN: No obvious lesions, wounds, erythema, or cyanosis noted on face or hands.  Assessment and Plan:   Ryan Rowe was seen today for covid positive.  Diagnoses and all orders for this visit:  COVID-19  Other orders -     nirmatrelvir/ritonavir EUA (PAXLOVID) 20 x 150 MG & 10 x 100MG  TABS; Take 3 tablets by mouth 2 (two) times daily for 5 days. (Take nirmatrelvir 150 mg two tablets twice daily for 5 days and ritonavir 100 mg one tablet twice daily for 5 days)    No red flags on discussion, patient is  not in any obvious distress during our visit. Discussed progression of most viral illnesses, and recommended supportive care at this point in time.  We decided to trial Paxlovid at this time. Discussed over the counter supportive care options, including Tylenol 500 mg q 8 hours, with  recommendations to push fluids and rest. Reviewed return precautions including new/worsening fever, SOB, new/worsening cough, sudden onset changes of symptoms. Recommended need to self-quarantine and practice social distancing until symptoms resolve. I recommend that patient follow-up if symptoms worsen or persist despite treatment x 7-10 days, sooner if needed.  I discussed the assessment and treatment plan with the patient. The patient was provided an opportunity to ask questions and all were answered. The patient agreed with the plan and demonstrated an understanding of the instructions.   The patient was advised to call back or seek an in-person evaluation if the symptoms worsen or if the condition fails to improve as anticipated.    [place], [place] 05/05/2022

## 2022-05-09 ENCOUNTER — Telehealth: Payer: Self-pay | Admitting: Physician Assistant

## 2022-05-09 NOTE — Telephone Encounter (Signed)
Spoke to pt told him needs to complete Paxlovid Rx even though you are testing negative now. Pt verbalized understanding.

## 2022-05-09 NOTE — Telephone Encounter (Signed)
Pt states: -previously prescribed Paxlovid after positive of COVID-19 -Tested negative for COVID-19 on 05/08/22  Pt asks: -Should he continue the prescription Paxlovid or stop now that he is testing negative?   Pt requests: -follow up via phone or mychart message.

## 2022-05-17 ENCOUNTER — Encounter: Payer: Self-pay | Admitting: Physician Assistant

## 2022-05-17 ENCOUNTER — Ambulatory Visit (INDEPENDENT_AMBULATORY_CARE_PROVIDER_SITE_OTHER): Payer: Managed Care, Other (non HMO) | Admitting: Physician Assistant

## 2022-05-17 VITALS — BP 126/90 | HR 82 | Temp 97.5°F | Ht 70.5 in | Wt 277.2 lb

## 2022-05-17 DIAGNOSIS — Z13220 Encounter for screening for lipoid disorders: Secondary | ICD-10-CM

## 2022-05-17 DIAGNOSIS — R7303 Prediabetes: Secondary | ICD-10-CM | POA: Diagnosis not present

## 2022-05-17 DIAGNOSIS — Z23 Encounter for immunization: Secondary | ICD-10-CM

## 2022-05-17 DIAGNOSIS — H6122 Impacted cerumen, left ear: Secondary | ICD-10-CM

## 2022-05-17 DIAGNOSIS — Z136 Encounter for screening for cardiovascular disorders: Secondary | ICD-10-CM | POA: Diagnosis not present

## 2022-05-17 DIAGNOSIS — Z0000 Encounter for general adult medical examination without abnormal findings: Secondary | ICD-10-CM | POA: Diagnosis not present

## 2022-05-17 DIAGNOSIS — I10 Essential (primary) hypertension: Secondary | ICD-10-CM | POA: Diagnosis not present

## 2022-05-17 LAB — COMPREHENSIVE METABOLIC PANEL
ALT: 27 U/L (ref 0–53)
AST: 18 U/L (ref 0–37)
Albumin: 4.6 g/dL (ref 3.5–5.2)
Alkaline Phosphatase: 49 U/L (ref 39–117)
BUN: 14 mg/dL (ref 6–23)
CO2: 32 mEq/L (ref 19–32)
Calcium: 9.5 mg/dL (ref 8.4–10.5)
Chloride: 101 mEq/L (ref 96–112)
Creatinine, Ser: 1.12 mg/dL (ref 0.40–1.50)
GFR: 84.31 mL/min (ref >60.00)
Glucose, Bld: 119 mg/dL — ABNORMAL HIGH (ref 70–99)
Potassium: 4.6 mEq/L (ref 3.5–5.1)
Sodium: 137 mEq/L (ref 135–145)
Total Bilirubin: 0.4 mg/dL (ref 0.2–1.2)
Total Protein: 7.6 g/dL (ref 6.0–8.3)

## 2022-05-17 LAB — CBC WITH DIFFERENTIAL/PLATELET
Basophils Absolute: 0 10*3/uL (ref 0.0–0.1)
Basophils Relative: 0.9 % (ref 0.0–3.0)
Eosinophils Absolute: 0.1 10*3/uL (ref 0.0–0.7)
Eosinophils Relative: 1.7 % (ref 0.0–5.0)
HCT: 43.4 % (ref 39.0–52.0)
Hemoglobin: 13.9 g/dL (ref 13.0–17.0)
Lymphocytes Relative: 36.8 % (ref 12.0–46.0)
Lymphs Abs: 1.9 10*3/uL (ref 0.7–4.0)
MCHC: 32 g/dL (ref 30.0–36.0)
MCV: 69.4 fl — ABNORMAL LOW (ref 78.0–100.0)
Monocytes Absolute: 0.4 10*3/uL (ref 0.1–1.0)
Monocytes Relative: 7.8 % (ref 3.0–12.0)
Neutro Abs: 2.7 10*3/uL (ref 1.4–7.7)
Neutrophils Relative %: 52.8 % (ref 43.0–77.0)
Platelets: 337 10*3/uL (ref 150.0–400.0)
RBC: 6.26 Mil/uL — ABNORMAL HIGH (ref 4.22–5.81)
RDW: 14.9 % (ref 11.5–15.5)
WBC: 5.1 10*3/uL (ref 4.0–10.5)

## 2022-05-17 LAB — HEMOGLOBIN A1C: Hgb A1c MFr Bld: 6.7 % — ABNORMAL HIGH (ref 4.6–6.5)

## 2022-05-17 LAB — LIPID PANEL
Cholesterol: 191 mg/dL (ref 0–200)
HDL: 32.4 mg/dL — ABNORMAL LOW (ref >39.00)
LDL Cholesterol: 141 mg/dL — ABNORMAL HIGH (ref 0–99)
NonHDL: 159.02
Total CHOL/HDL Ratio: 6
Triglycerides: 89 mg/dL (ref 0.0–149.0)
VLDL: 17.8 mg/dL (ref 0.0–40.0)

## 2022-05-17 MED ORDER — DOXYCYCLINE HYCLATE 100 MG PO TABS: 100.0000 mg | ORAL_TABLET | Freq: Two times a day (BID) | ORAL | 0 refills | Status: DC

## 2022-05-17 NOTE — Patient Instructions (Addendum)
It was great to see you!  Start the antibiotics for your sinus infection. Please start a daily antihistamine such as claritin, allegra, zyrtec for your symptoms (GENERIC IS FINE!)  Ideally, please keep an eye on your blood pressure and message [place] if consistently > 140/90  Please go to the lab for blood work.   Our office will call you with your results unless you have chosen to receive results via MyChart.  If your blood work is normal we will follow-up each year for physicals and as scheduled for chronic medical problems.  If anything is abnormal we will treat accordingly and get you in for a follow-up.  Take care,  [name]

## 2022-05-17 NOTE — Progress Notes (Signed)
Subjective:    Ryan Rowe is a 36 y.o. male and is here for a comprehensive physical exam.  HPI  Health Maintenance Due  Topic Date Due   INFLUENZA VACCINE  01/25/2022    Acute Concerns: Left Ear Cerumen Impaction Patient is complaining of a clogged left ear and is requesting for it to be cleared.   Congestion Patient is complaining about post nasal drip, nose/chest congestion, and a productive cough.This started about a week prior to him testing positive for COVID earlier this month. He states that it may be due to the changing seasons. He reports that he does have a nasal spray but only uses it when he feels he needs it which is usually at night. Patient has no known allergies and has hx of asthma. He states that he has not had to deal with asthma since high school and grew out of it. He does have an inhaler, but hasn't used it in years. Symptoms are managed with Theraflu with relief.   Chronic Issues: Hypertension Patient reports that he checks his blood pressure inconsistently at home. He has home readings averaging around 124/85. He is complaint with his 40 mg lisinopril.   PreDiabetes Patient reports that he is no longer taking Metformin. The reason was not adverse side effects, but the change in his eating habits.   Health Maintenance: Immunizations -- He is receiving an influenza vaccine this visit. PSA --  Lab Results  Component Value Date   PSA 0.55 07/18/2018   Diet -- Patient reports that he is making healthy food choices. Exercise -- He started to participate in regular exercise.   Recent weight history Wt Readings from Last 10 Encounters:  05/17/22 277 lb 4 oz (125.8 kg)  09/30/21 287 lb 3.2 oz (130.3 kg)  09/24/21 284 lb (128.8 kg)  05/26/21 291 lb 6.1 oz (132.2 kg)  03/18/21 262 lb (118.8 kg)  11/13/19 263 lb 6.4 oz (119.5 kg)  07/15/19 264 lb 6.1 oz (119.9 kg)  01/16/19 259 lb (117.5 kg)  07/18/18 246 lb 6.1 oz (111.8 kg)  07/13/18 245 lb 8 oz  (111.4 kg)   Body mass index is 39.22 kg/m. Alcohol use --  reports current alcohol use.  Tobacco use --  Tobacco Use: Low Risk  (05/17/2022)   Patient History    Smoking Tobacco Use: Never    Smokeless Tobacco Use: Never    Passive Exposure: Not on file    Eligible for Low Dose CT?  UTD with eye doctor? He is not UTD on vision care. UTD with dentist? He is not UTD on dental care.     05/17/2022    8:42 AM  Depression screen PHQ 2/9  Decreased Interest 0  Down, Depressed, Hopeless 0  PHQ - 2 Score 0    Other providers/specialists: Patient Care Team: Jarold Motto, [place] as PCP - General (Physician Assistant)    PMHx, SurgHx, SocialHx, Medications, and Allergies were reviewed in the Visit Navigator and updated as appropriate.   Past Medical History:  Diagnosis Date   GERD (gastroesophageal reflux disease)    Hypertension    Pre-diabetes      Past Surgical History:  Procedure Laterality Date   FRACTURE SURGERY Right    wrist     Family History  Problem Relation Age of Onset   Depression Mother    Diabetes Mother        -pre-diabetic   Hypertension Father    ALS Maternal Grandfather  Diabetes Maternal Grandfather    Hypertension Paternal Grandmother    Hypertension Paternal Grandfather    Cancer Maternal Aunt        breast   Cancer Maternal Aunt        breast   Heart disease Cousin 70   Diabetes Other    Stroke Neg Hx     Social History   Tobacco Use   Smoking status: Never   Smokeless tobacco: Never  Substance Use Topics   Alcohol use: Yes    Comment: Socially   Drug use: No    Review of Systems:   Review of Systems  Constitutional:  Negative for chills, fever, malaise/fatigue and weight loss.  HENT:  Negative for hearing loss, sinus pain and sore throat.   Respiratory:  Negative for cough and hemoptysis.   Cardiovascular:  Negative for chest pain, palpitations, leg swelling and PND.  Gastrointestinal:  Negative for abdominal pain,  constipation, diarrhea, heartburn, nausea and vomiting.  Genitourinary:  Negative for dysuria, frequency and urgency.  Musculoskeletal:  Negative for back pain, myalgias and neck pain.  Skin:  Negative for itching and rash.  Endo/Heme/Allergies:  Negative for polydipsia.  Psychiatric/Behavioral:  Negative for depression. The patient is not nervous/anxious.     Objective:    Vitals:   05/17/22 0843 05/17/22 0918  BP: (!) 130/90 (!) 126/90  Pulse: 82   Temp: (!) 97.5 F (36.4 C)   SpO2: 98%     Body mass index is 39.22 kg/m.  General  Alert, cooperative, no distress, appears stated age  Head:  Normocephalic, without obvious abnormality, atraumatic  Eyes:  PERRL, conjunctiva/corneas clear, EOM's intact, fundi benign, both eyes       Ears:  Normal right TM's and external ear canals, both ears Left TM with cerumen impaction  Nose: Nares normal, septum midline, mucosa normal, no drainage or sinus tenderness  Throat: Lips, mucosa, and tongue normal; teeth and gums normal  Neck: Supple, symmetrical, trachea midline, no adenopathy;     thyroid:  No enlargement/tenderness/nodules; no carotid bruit or JVD  Back:   Symmetric, no curvature, ROM normal, no CVA tenderness  Lungs:   Clear to auscultation bilaterally, respirations unlabored  Chest wall:  No tenderness or deformity  Heart:  Regular rate and rhythm, S1 and S2 normal, no murmur, rub or gallop  Abdomen:   Soft, non-tender, bowel sounds active all four quadrants, no masses, no organomegaly  Extremities: Extremities normal, atraumatic, no cyanosis or edema  Prostate : Deferred   Skin: Skin color, texture, turgor normal, no rashes or lesions  Lymph nodes: Cervical, supraclavicular, and axillary nodes normal  Neurologic: CNII-XII grossly intact. Normal strength, sensation and reflexes throughout   Ceruminosis is noted.  Wax is removed by syringing and manual debridement. Tolerated well.   AssessmentPlan:   Routine physical  examination Today patient counseled on age appropriate routine health concerns for screening and prevention, each reviewed and up to date or declined. Immunizations reviewed and up to date or declined. Labs ordered and reviewed. Risk factors for depression reviewed and negative. Hearing function and visual acuity are intact. ADLs screened and addressed as needed. Functional ability and level of safety reviewed and appropriate. Education, counseling and referrals performed based on assessed risks today. Patient provided with a copy of personalized plan for preventive services.  Pre-diabetes Update A1c and provide recommendations accordingly Consider adding metformin back if indicated Follow-up in 3-6 months based on results  Essential hypertension Above goal No evidence of end-organ  damage Continue lisinopril 40 mg and continue to check at home Home readings are normal, per patient Follow-up if BP consistently > 140/90  Encounter for lipid screening for cardiovascular disease Update lipid panel  Impacted cerumen of left ear Tolerated ear lavage well Denies concerns s/p procedure  I,Verona Buck,acting as a scribe for [employer], PA.,have documented all relevant documentation on the behalf of Jarold Motto, PA,as directed by  Jarold Motto, PA while in the presence of Jarold Motto, [place].  I, Jarold Motto, [place], have reviewed all documentation for this visit. The documentation on 05/17/22 for the exam, diagnosis, procedures, and orders are all accurate and complete.  Jarold Motto, PA-C [facility]

## 2022-05-18 ENCOUNTER — Encounter: Payer: Self-pay | Admitting: Physician Assistant

## 2022-05-18 MED ORDER — OZEMPIC (0.25 OR 0.5 MG/DOSE) 2 MG/3ML ~~LOC~~ SOPN: 0.2500 mg | PEN_INJECTOR | SUBCUTANEOUS | 2 refills | Status: DC

## 2022-05-23 ENCOUNTER — Other Ambulatory Visit: Payer: Self-pay | Admitting: Physician Assistant

## 2022-05-24 ENCOUNTER — Telehealth: Payer: Self-pay | Admitting: *Deleted

## 2022-05-24 NOTE — Telephone Encounter (Signed)
Received fax from pharmacy PA needed for Ozempic 0.25 mg. PA done thru Covermymeds. Awaiting determination.

## 2022-05-30 NOTE — Telephone Encounter (Signed)
Called pharmacy an spoke to [place], told her PA for Ozempic 0.25 mg has been approved for one year. Roney Marion verbalized understanding ran medication through system and she said will be $25.00. Told her okay I will let the pt know.

## 2022-05-30 NOTE — Telephone Encounter (Signed)
Received response from insurance [employer] PA for Ozempic 0.25 mg has been approved 05/30/2022 thru 05/30/2023.

## 2022-05-30 NOTE — Telephone Encounter (Signed)
Spoke to pt told him Ozempic has been approved for one year. Pharmacy notified and they said it would be $25.00. Pt verbalized understanding.

## 2022-06-08 ENCOUNTER — Ambulatory Visit (INDEPENDENT_AMBULATORY_CARE_PROVIDER_SITE_OTHER): Payer: Managed Care, Other (non HMO) | Admitting: Physician Assistant

## 2022-06-08 ENCOUNTER — Encounter: Payer: Self-pay | Admitting: Physician Assistant

## 2022-06-08 VITALS — BP 138/100 | HR 80 | Temp 98.7°F | Ht 70.5 in | Wt 279.2 lb

## 2022-06-08 DIAGNOSIS — G4452 New daily persistent headache (NDPH): Secondary | ICD-10-CM | POA: Diagnosis not present

## 2022-06-08 DIAGNOSIS — I10 Essential (primary) hypertension: Secondary | ICD-10-CM

## 2022-06-08 MED ORDER — KETOROLAC TROMETHAMINE 60 MG/2ML IM SOLN
60.0000 mg | Freq: Once | INTRAMUSCULAR | Status: AC
  Administered 2022-06-08: 60 mg via INTRAMUSCULAR

## 2022-06-08 NOTE — Progress Notes (Signed)
Ryan Rowe is a 36 y.o. male here for a new problem.  History of Present Illness:   Chief Complaint  Patient presents with   Headache    Pt c/o headaches every day x 1 month since he had COVID. He has tried Excedrin, Ibuprofen, Tylenol no relief.    HPI  Headaches Occurring daily since COVID illness x1 month ago. Severe especially last night, waking him from his sleep. Diffuse pressure all over head- described as a heaviness. The pain radiates to different areas of his head throughout the day. Worsened with positional changes such as bending forward. Accompanying nausea which first occurred last night. Accompanying blurred vision bilateral eyes. He reports sensitivity to sound when wearing his airpods requiring him to turn the volume down. Accompanying left-sided cheek tingling intermittently. Feels that last night's headache was the worst headache he has ever had, was a 20/10.  He tried Tylenol and Excedrin without any relief. Initial dose of Ibuprofen 800mg  helped but his dose last night did not help at all.  He denies any head trauma. Denies any numbness or focal weakness.  He is unsure if his headaches are worse when his BP is elevated. He last checked it x2 weeks ago and it was 130/80 at that time.  BP Readings from Last 5 Encounters:  06/08/22 (!) 130/96  05/17/22 (!) 126/90  09/30/21 (!) 147/91  05/26/21 136/90  11/13/19 140/90   Hx of headaches many years ago but less severe at that time. He denies any concerns for sleep apnea. He does not snore. He feels that he sleeps okay usually but has not been sleeping well since his headaches started.  Reports associated fatigue.  He has been trying to follow a vegan diet but suspects that he eats a lot of sodium. He started the Ozempic after his headaches began so he does not feel that they are related.  Past Medical History:  Diagnosis Date   GERD (gastroesophageal reflux disease)    Hypertension     Pre-diabetes      Social History   Tobacco Use   Smoking status: Never   Smokeless tobacco: Never  Substance Use Topics   Alcohol use: Yes    Comment: Socially   Drug use: No    Past Surgical History:  Procedure Laterality Date   FRACTURE SURGERY Right    wrist    Family History  Problem Relation Age of Onset   Depression Mother    Diabetes Mother        -pre-diabetic   Hypertension Father    ALS Maternal Grandfather    Diabetes Maternal Grandfather    Hypertension Paternal Grandmother    Hypertension Paternal Grandfather    Cancer Maternal Aunt        breast   Cancer Maternal Aunt        breast   Heart disease Cousin 50   Diabetes Other    Stroke Neg Hx     No Known Allergies  Current Medications:   Current Outpatient Medications:    lisinopril (ZESTRIL) 40 MG tablet, TAKE 1 TABLET(40 MG) BY MOUTH DAILY, Disp: 90 tablet, Rfl: 1   Omega-3 Fatty Acids (FISH OIL OMEGA-3 PO), Take by mouth daily in the afternoon. Takes one teaspoon, Disp: , Rfl:    OVER THE COUNTER MEDICATION, Take 1 capsule by mouth daily in the afternoon. Minerals, Disp: , Rfl:    Semaglutide,0.25 or 0.5MG /DOS, (OZEMPIC, 0.25 OR 0.5 MG/DOSE,) 2 MG/3ML SOPN, Inject 0.25 mg into  the skin once a week., Disp: 3 mL, Rfl: 2   Review of Systems:   Review of Systems  Constitutional:  Positive for malaise/fatigue.  Eyes:  Positive for blurred vision.  Respiratory:         Negative for snoring  Gastrointestinal:  Positive for nausea.  Neurological:  Positive for tingling and headaches. Negative for focal weakness and weakness.  Psychiatric/Behavioral:  The patient has insomnia.     Vitals:   Vitals:   06/08/22 1308  BP: (!) 130/96  Pulse: 80  Temp: 98.7 F (37.1 C)  TempSrc: Temporal  SpO2: 99%  Weight: 279 lb 4 oz (126.7 kg)  Height: 5' 10.5" (1.791 m)     Body mass index is 39.5 kg/m.  Physical Exam:   Physical Exam Vitals and nursing note reviewed.  Constitutional:       General: He is not in acute distress.    Appearance: He is well-developed. He is not ill-appearing or toxic-appearing.  Cardiovascular:     Rate and Rhythm: Normal rate and regular rhythm.     Pulses: Normal pulses.     Heart sounds: Normal heart sounds, S1 normal and S2 normal.  Pulmonary:     Effort: Pulmonary effort is normal.     Breath sounds: Normal breath sounds.  Skin:    General: Skin is warm and dry.  Neurological:     General: No focal deficit present.     Mental Status: He is alert.     GCS: GCS eye subscore is 4. GCS verbal subscore is 5. GCS motor subscore is 6.     Cranial Nerves: Cranial nerves 2-12 are intact.     Sensory: Sensation is intact.     Motor: Motor function is intact.     Coordination: Coordination is intact.     Gait: Gait is intact.  Psychiatric:        Speech: Speech normal.        Behavior: Behavior normal. Behavior is cooperative.     Assessment and Plan:   Essential hypertension Above goal Recommend close monitoring at home Continue lisinopril 40 mg daily If BP consistently > 130/80, recommend close follow-up Follow-up in 2 weeks  New daily persistent headache Given severity, worst HA of life, lack of response to medication, and new sx, will order stat MRI to r/o mass, MS, ICH, etc Toradol injection given today, tolerated well Will consider elavil vs abortive treatment based on results of MRI ER precautions advised  I,Alexis Herring,acting as a scribe for [employer], PA.,have documented all relevant documentation on the behalf of Jarold Motto, PA,as directed by  Jarold Motto, PA while in the presence of Jarold Motto, [place].  I, Jarold Motto, [place], have reviewed all documentation for this visit. The documentation on 06/08/22 for the exam, diagnosis, procedures, and orders are all accurate and complete.   Jarold Motto, PA-C

## 2022-06-08 NOTE — Patient Instructions (Signed)
It was great to see you!  We are going to order an MRI today  We gave you a toradol injection today for your pain  You can take tylenol today if needed.  Keep an eye on your blood pressure.   I will be in touch with the plan after we get your MRI results.  Likely have you follow-up in 2 weeks.  Take care,  Jarold Motto PA-C   Headache Precautions: Contact a doctor if: Your symptoms are not helped by medicine. You have a headache that feels different than the other headaches. You feel sick to your stomach (nauseous) or you throw up (vomit). You have a fever. Get help right away if: Your headache gets very bad quickly. Your headache gets worse after a lot of physical activity. You keep throwing up. You have a stiff neck. You have trouble seeing. You have trouble speaking. You have pain in the eye or ear. Your muscles are weak or you lose muscle control. You lose your balance or have trouble walking. You feel like you will pass out (faint) or you pass out. You are mixed up (confused). You have a seizure.

## 2022-06-09 ENCOUNTER — Encounter: Payer: Self-pay | Admitting: Physician Assistant

## 2022-06-09 ENCOUNTER — Telehealth: Payer: Self-pay | Admitting: Physician Assistant

## 2022-06-10 NOTE — Telephone Encounter (Signed)
error 

## 2022-06-21 ENCOUNTER — Encounter: Payer: Self-pay | Admitting: Physician Assistant

## 2022-06-23 ENCOUNTER — Ambulatory Visit (HOSPITAL_COMMUNITY)
Admission: RE | Admit: 2022-06-23 | Discharge: 2022-06-23 | Disposition: A | Discharge status: Home / Self Care | Payer: Managed Care, Other (non HMO) | Source: Ambulatory Visit | Attending: Physician Assistant | Admitting: Physician Assistant

## 2022-06-23 DIAGNOSIS — G4452 New daily persistent headache (NDPH): Secondary | ICD-10-CM | POA: Diagnosis present

## 2022-08-25 ENCOUNTER — Other Ambulatory Visit: Payer: Self-pay | Admitting: Physician Assistant

## 2022-08-30 ENCOUNTER — Encounter: Payer: Self-pay | Admitting: Physician Assistant

## 2022-08-30 ENCOUNTER — Ambulatory Visit (INDEPENDENT_AMBULATORY_CARE_PROVIDER_SITE_OTHER): Payer: Managed Care, Other (non HMO) | Admitting: Physician Assistant

## 2022-08-30 VITALS — BP 128/80 | HR 80 | Temp 98.0°F | Ht 70.5 in | Wt 274.2 lb

## 2022-08-30 DIAGNOSIS — F419 Anxiety disorder, unspecified: Secondary | ICD-10-CM

## 2022-08-30 DIAGNOSIS — I10 Essential (primary) hypertension: Secondary | ICD-10-CM | POA: Diagnosis not present

## 2022-08-30 DIAGNOSIS — R7303 Prediabetes: Secondary | ICD-10-CM

## 2022-08-30 DIAGNOSIS — R519 Headache, unspecified: Secondary | ICD-10-CM

## 2022-08-30 LAB — POCT GLYCOSYLATED HEMOGLOBIN (HGB A1C): Hemoglobin A1C: 5.8 % — AB (ref 4.0–5.6)

## 2022-08-30 MED ORDER — BUSPIRONE HCL 15 MG PO TABS: 15.0000 mg | ORAL_TABLET | Freq: Two times a day (BID) | ORAL | 1 refills | Status: DC | PRN

## 2022-08-30 NOTE — Progress Notes (Signed)
Ryan Rowe is a 37 y.o. male here for a follow up of a pre-existing problem.  History of Present Illness:   Chief Complaint  Patient presents with   Headache    Pt here for f/u headaches stopped 2 days ago.    Headache     Diabetes 4 month follow-up. Current DM meds: Ozempic 0.25 mg weekly. Blood sugars at home are: not checked. Patient is compliant with medications. Denies: hypoglycemic or hyperglycemic episodes or symptoms. This patient's diabetes is complicated by HTN.  Lab Results  Component Value Date   HGBA1C 5.8 (A) 08/30/2022    Headache Recently had worsening anxiety and felt like this contributed to his head Has had a normal MRI in December for recurrent headache He states that the headache is usually, like a vice grip and does not normally cause nausea vomiting or light or sound sensitivity He will take Excedrin and Aleve or Tylenol without any relief Does admit to some component of social anxiety that is uncontrolled In the past we have tried hydroxyzine for his anxiety but this has made him very tired He also trialed CBD but this made him sleepy as well   Past Medical History:  Diagnosis Date   GERD (gastroesophageal reflux disease)    Hypertension    Pre-diabetes      Social History   Tobacco Use   Smoking status: Never   Smokeless tobacco: Never  Substance Use Topics   Alcohol use: Yes    Comment: Socially   Drug use: No    Past Surgical History:  Procedure Laterality Date   FRACTURE SURGERY Right    wrist    Family History  Problem Relation Age of Onset   Depression Mother    Diabetes Mother        -pre-diabetic   Hypertension Father    ALS Maternal Grandfather    Diabetes Maternal Grandfather    Hypertension Paternal Grandmother    Hypertension Paternal Grandfather    Cancer Maternal Aunt        breast   Cancer Maternal Aunt        breast   Heart disease Cousin 50   Diabetes Other    Stroke Neg Hx     No Known  Allergies  Current Medications:   Current Outpatient Medications:    lisinopril (ZESTRIL) 40 MG tablet, TAKE 1 TABLET(40 MG) BY MOUTH DAILY, Disp: 90 tablet, Rfl: 1   Omega-3 Fatty Acids (FISH OIL OMEGA-3 PO), Take by mouth daily in the afternoon. Takes one teaspoon, Disp: , Rfl:    OVER THE COUNTER MEDICATION, Take 1 capsule by mouth daily in the afternoon. Minerals, Disp: , Rfl:    Semaglutide,0.25 or 0.'5MG'$ /DOS, (OZEMPIC, 0.25 OR 0.5 MG/DOSE,) 2 MG/3ML SOPN, INJECT 0.'25MG'$  INTO THE SKIN ONCE A WEEK, Disp: 9 mL, Rfl: 0   Review of Systems:   Review of Systems  Neurological:  Positive for headaches.   Negative unless otherwise specified per HPI.  Vitals:   Vitals:   08/30/22 1307  BP: 128/80  Pulse: 80  Temp: 98 F (36.7 C)  TempSrc: Temporal  SpO2: 96%  Weight: 274 lb 4 oz (124.4 kg)  Height: 5' 10.5" (1.791 m)     Body mass index is 38.79 kg/m.  Physical Exam:   Physical Exam Vitals and nursing note reviewed.  Constitutional:      General: He is not in acute distress.    Appearance: He is well-developed. He is not ill-appearing or  toxic-appearing.  Cardiovascular:     Rate and Rhythm: Normal rate and regular rhythm.     Pulses: Normal pulses.     Heart sounds: Normal heart sounds, S1 normal and S2 normal.  Pulmonary:     Effort: Pulmonary effort is normal.     Breath sounds: Normal breath sounds.  Skin:    General: Skin is warm and dry.  Neurological:     Mental Status: He is alert.     GCS: GCS eye subscore is 4. GCS verbal subscore is 5. GCS motor subscore is 6.  Psychiatric:        Speech: Speech normal.        Behavior: Behavior normal. Behavior is cooperative.     Assessment and Plan:   Anxiety Uncontrolled per patient Trial BuSpar 5 to 15 mg twice daily as needed for anxiety Follow-up if any concerns with this medication or consider follow-up in 3 to 6 months to check in We could consider a TCA to help with anxiety and headaches if this is  unhelpful  Pre-diabetes Repeat A1c today much improved May increase Ozempic to 0.5 mg for weight loss purposes, he would like to stay on 0.25 mg weekly at this time Follow-up in 6, sooner if concerns  Essential hypertension Normotensive today Continue lisinopril 40 mg daily Continue to check blood pressure at least occasionally and follow-up if any new or worsening symptoms  Frequent headaches Uncontrolled Will trial anxiety treatment per patient's request We could consider a TCA to help with anxiety and headaches if this is unhelpful May also consider neuro referral  Inda Coke, PA-C

## 2022-08-30 NOTE — Patient Instructions (Addendum)
It was great to see you!  Your A1c has improved greatly!! It was 6.7% and now it is 5.8%. Keep up the good work!! I'm proud of you!  Trial buspar for your anxiety and let me know if this is not helpful!  Follow-up in 3-6 months, sooner if concerns.  Headache Recommendations: 1.  Trial as needed buspar for your symptoms 3.  Limit use of pain relievers to no more than 2 days out of the week.  These medications include acetaminophen, ibuprofen, triptans and narcotics.  This will help reduce risk of rebound headaches. 4.  Be aware of common food triggers such as processed sweets, processed foods with nitrites (such as deli meat, hot dogs, sausages), foods with MSG, alcohol (such as wine), chocolate, certain cheeses, certain fruits (dried fruits, bananas, pineapple), vinegar, diet soda. 4.  Avoid caffeine 5.  Routine exercise 6.  Proper sleep hygiene 7.  Stay adequately hydrated with water 8.  Keep a headache diary. 9.  Maintain proper stress management. 10.  Do not skip meals. 11.  Consider supplements:  Magnesium citrate '400mg'$  to '600mg'$  daily, riboflavin '400mg'$ , Coenzyme Q 10 '100mg'$  three times daily  Headache Precautions: Contact a doctor if: Your symptoms are not helped by medicine. You have a headache that feels different than the other headaches. You feel sick to your stomach (nauseous) or you throw up (vomit). You have a fever. Get help right away if: Your headache gets very bad quickly. Your headache gets worse after a lot of physical activity. You keep throwing up. You have a stiff neck. You have trouble seeing. You have trouble speaking. You have pain in the eye or ear. Your muscles are weak or you lose muscle control. You lose your balance or have trouble walking. You feel like you will pass out (faint) or you pass out. You are mixed up (confused). You have a seizure.   Take care,  Inda Coke PA-C

## 2022-10-31 ENCOUNTER — Other Ambulatory Visit: Payer: Self-pay | Admitting: Physician Assistant

## 2023-01-03 ENCOUNTER — Telehealth: Payer: Self-pay | Admitting: Physician Assistant

## 2023-01-03 NOTE — Telephone Encounter (Signed)
Patient states per Pharmacy RX for Semaglutide,0.25 or 0.5MG /DOS, (OZEMPIC, 0.25 OR 0.5 MG/DOSE,) 2 MG/3ML SOPN requires PA

## 2023-01-04 ENCOUNTER — Telehealth: Payer: Self-pay

## 2023-01-04 ENCOUNTER — Other Ambulatory Visit (HOSPITAL_COMMUNITY): Payer: Self-pay

## 2023-01-04 NOTE — Telephone Encounter (Signed)
Pharmacy Patient Advocate Encounter   Received notification that prior authorization for Ozempic 2mg /63ml is required/requested.   PA submitted to [facility] via CoverMyMeds Key  # [id number] Status is pending

## 2023-01-04 NOTE — Telephone Encounter (Signed)
Pharmacy Patient Advocate Encounter  Received notification from [employer] that the request for prior authorization for Ozempic 2mg /3ml has been denied due to you did not meet the following requirements: Based on the information provided, you do not meet the established medication-specific criteria or guidelines for Ozempic at this time. The request for coverage for OZEMPIC INJ 2MG /3ML, use as directed (9 per 84 days), is denied. This decision is based on health plan criteria for OZEMPIC INJ 2MG /3ML. This medicine is covered only if: One of the following: (1) Your doctor submits medical records (for example, chart notes) confirming diagnosis of type 2 diabetes mellitus as evidenced by one of the following laboratory values: (A) A1C greater than or equal to 6.5%. (B) Fasting plasma glucose greater than or equal to 126mg /dL. (C) 2-hour plasma glucose greater than or equal to 200mg /dL during oral glucose tolerance test. (D) Random plasma glucose greater than or equal to 200mg /dL with classic symptoms of hyperglycemia or hyperglycemic crisis. (2) If you require ongoing treatment for type 2 diabetes mellitus (that is, diagnosed greater than 2 years ago), your doctor submits medical records (for example, chart notes) confirming diagnosis of type 2 diabetes mellitus. .    Please be advised we currently do not have a Pharmacist to review denials, therefore you will need to process appeals accordingly as needed. Thanks for your support at this time.   The provider may request a reconsideration of this decision with a reviewing physician or other appropriate reviewer at [phone].

## 2023-01-04 NOTE — Telephone Encounter (Signed)
See previous message from [place]

## 2023-01-04 NOTE — Telephone Encounter (Signed)
Please see message, Ozempic has been denied. Please advise.

## 2023-01-04 NOTE — Telephone Encounter (Signed)
Ryan Rowe, pt does not have Type 2 Diabetes. He is pre-diabetic, Hypertension. HIs PA for Ozempic was approved 05/30/2022 till 05/31/2023. I am not sure what is wrong.

## 2023-01-04 NOTE — Telephone Encounter (Signed)
Please see message below

## 2023-01-05 ENCOUNTER — Encounter: Payer: Self-pay | Admitting: Physician Assistant

## 2023-01-05 DIAGNOSIS — E119 Type 2 diabetes mellitus without complications: Secondary | ICD-10-CM | POA: Insufficient documentation

## 2023-01-06 NOTE — Telephone Encounter (Signed)
Spoke to pt told him Ryan Rowe has been denied by your new insurance. [name] would like to see you so we can document that you are diabetic and try to resubmit to insurance. Pt verbalized understanding. Appt scheduled for Wed 01/11/2023.

## 2023-01-10 NOTE — Progress Notes (Signed)
Ryan Rowe is a 37 y.o. male here for a follow up of a pre-existing problem.  History of Present Illness:   No chief complaint on file.   HPI     Past Medical History:  Diagnosis Date   GERD (gastroesophageal reflux disease)    Hypertension    Pre-diabetes      Social History   Tobacco Use   Smoking status: Never   Smokeless tobacco: Never  Substance Use Topics   Alcohol use: Yes    Comment: Socially   Drug use: No    Past Surgical History:  Procedure Laterality Date   FRACTURE SURGERY Right    wrist    Family History  Problem Relation Age of Onset   Depression Mother    Diabetes Mother        -pre-diabetic   Hypertension Father    ALS Maternal Grandfather    Diabetes Maternal Grandfather    Hypertension Paternal Grandmother    Hypertension Paternal Grandfather    Cancer Maternal Aunt        breast   Cancer Maternal Aunt        breast   Heart disease Cousin 50   Diabetes Other    Stroke Neg Hx     No Known Allergies  Current Medications:   Current Outpatient Medications:    busPIRone (BUSPAR) 15 MG tablet, Take 1 tablet (15 mg total) by mouth 2 (two) times daily as needed., Disp: 30 tablet, Rfl: 1   lisinopril (ZESTRIL) 40 MG tablet, TAKE 1 TABLET(40 MG) BY MOUTH DAILY, Disp: 90 tablet, Rfl: 1   Omega-3 Fatty Acids (FISH OIL OMEGA-3 PO), Take by mouth daily in the afternoon. Takes one teaspoon, Disp: , Rfl:    OVER THE COUNTER MEDICATION, Take 1 capsule by mouth daily in the afternoon. Minerals, Disp: , Rfl:    Semaglutide,0.25 or 0.5MG /DOS, (OZEMPIC, 0.25 OR 0.5 MG/DOSE,) 2 MG/3ML SOPN, INJECT 0.25MG  INTO THE SKIN ONCE A WEEK, Disp: 9 mL, Rfl: 0   Review of Systems:   ROS  Vitals:   There were no vitals filed for this visit.   There is no height or weight on file to calculate BMI.  Physical Exam:   Physical Exam  Assessment and Plan:   ***   I,Alexander Ruley,acting as a scribe for Jarold Motto, PA.,have documented all  relevant documentation on the behalf of Jarold Motto, PA,as directed by  Jarold Motto, PA while in the presence of Jarold Motto, [place].   ***   Jarold Motto, PA-C

## 2023-01-11 ENCOUNTER — Ambulatory Visit (INDEPENDENT_AMBULATORY_CARE_PROVIDER_SITE_OTHER): Payer: 59 | Admitting: Physician Assistant

## 2023-01-11 VITALS — BP 136/80 | HR 85 | Temp 97.7°F | Ht 70.5 in | Wt 274.5 lb

## 2023-01-11 DIAGNOSIS — E119 Type 2 diabetes mellitus without complications: Secondary | ICD-10-CM

## 2023-01-11 DIAGNOSIS — Z7985 Long-term (current) use of injectable non-insulin antidiabetic drugs: Secondary | ICD-10-CM

## 2023-01-11 DIAGNOSIS — I10 Essential (primary) hypertension: Secondary | ICD-10-CM | POA: Diagnosis not present

## 2023-01-11 DIAGNOSIS — M542 Cervicalgia: Secondary | ICD-10-CM | POA: Diagnosis not present

## 2023-01-11 LAB — COMPREHENSIVE METABOLIC PANEL
ALT: 27 U/L (ref 0–53)
AST: 20 U/L (ref 0–37)
Albumin: 4.3 g/dL (ref 3.5–5.2)
Alkaline Phosphatase: 48 U/L (ref 39–117)
BUN: 17 mg/dL (ref 6–23)
CO2: 28 mEq/L (ref 19–32)
Calcium: 9.3 mg/dL (ref 8.4–10.5)
Chloride: 102 mEq/L (ref 96–112)
Creatinine, Ser: 1.05 mg/dL (ref 0.40–1.50)
GFR: 90.69 mL/min (ref >60.00)
Glucose, Bld: 114 mg/dL — ABNORMAL HIGH (ref 70–99)
Potassium: 3.6 mEq/L (ref 3.5–5.1)
Sodium: 138 mEq/L (ref 135–145)
Total Bilirubin: 0.7 mg/dL (ref 0.2–1.2)
Total Protein: 7.3 g/dL (ref 6.0–8.3)

## 2023-01-11 LAB — MICROALBUMIN / CREATININE URINE RATIO
Creatinine,U: 114.4 mg/dL
Microalb Creat Ratio: 0.6 mg/g (ref 0.0–30.0)
Microalb, Ur: <0.7 mg/dL (ref 0.0–1.9)

## 2023-01-11 LAB — HEMOGLOBIN A1C: Hgb A1c MFr Bld: 6.2 % (ref 4.6–6.5)

## 2023-01-11 MED ORDER — SEMAGLUTIDE (1 MG/DOSE) 4 MG/3ML ~~LOC~~ SOPN: 1.0000 mg | PEN_INJECTOR | SUBCUTANEOUS | 1 refills | Status: DC

## 2023-01-11 NOTE — Patient Instructions (Signed)
It was great to see you!  Restart Ozempic 1 mg once you get this  Please work on less fried foods and alcohol  Let me know if your mom feels as though you need any genetic testing in regards to BRCA - I'll place referral if so  Let's follow-up in 6 months, sooner if you have concerns.  Take care,  Jarold Motto PA-C

## 2023-01-12 NOTE — Progress Notes (Deleted)
    Aleen Sells D.[facility] [address] [place] [address] Phone: [phone]   Assessment and Plan:     There are no diagnoses linked to this encounter.  ***   Pertinent previous records reviewed include ***   Follow Up: ***     Subjective:   I,  , am serving as a Neurosurgeon for Doctor Richardean Sale  Chief Complaint: neck pain   HPI:   01/13/2023 Patient is a 37 year old male complaining of neck pain. Patient states  Relevant Historical Information: ***  Additional pertinent review of systems negative.   Current Outpatient Medications:    busPIRone (BUSPAR) 15 MG tablet, Take 1 tablet (15 mg total) by mouth 2 (two) times daily as needed., Disp: 30 tablet, Rfl: 1   lisinopril (ZESTRIL) 40 MG tablet, TAKE 1 TABLET(40 MG) BY MOUTH DAILY, Disp: 90 tablet, Rfl: 1   Omega-3 Fatty Acids (FISH OIL OMEGA-3 PO), Take by mouth daily in the afternoon. Takes one teaspoon, Disp: , Rfl:    OVER THE COUNTER MEDICATION, Take 1 capsule by mouth daily in the afternoon. Minerals, Disp: , Rfl:    Semaglutide, 1 MG/DOSE, 4 MG/3ML SOPN, Inject 1 mg as directed once a week., Disp: 3 mL, Rfl: 1   Objective:     There were no vitals filed for this visit.    There is no height or weight on file to calculate BMI.    Physical Exam:    ***   Electronically signed by:  Aleen Sells D.[facility] 7:08 AM 01/12/23

## 2023-01-13 ENCOUNTER — Ambulatory Visit: Payer: 59 | Admitting: Sports Medicine

## 2023-01-18 NOTE — Progress Notes (Unsigned)
    Ryan Rowe D.[facility] [address] [place] [address] Phone: [phone]   Assessment and Plan:     There are no diagnoses linked to this encounter.  ***   Pertinent previous records reviewed include ***   Follow Up: ***     Subjective:   I, Ryan Rowe, am serving as a Neurosurgeon for Doctor Richardean Sale  Chief Complaint: neck pain   HPI:   01/19/2023 Patient is a 37 year old male complaining of neck pain. Patient states  Relevant Historical Information: ***  Additional pertinent review of systems negative.   Current Outpatient Medications:    busPIRone (BUSPAR) 15 MG tablet, Take 1 tablet (15 mg total) by mouth 2 (two) times daily as needed., Disp: 30 tablet, Rfl: 1   lisinopril (ZESTRIL) 40 MG tablet, TAKE 1 TABLET(40 MG) BY MOUTH DAILY, Disp: 90 tablet, Rfl: 1   Omega-3 Fatty Acids (FISH OIL OMEGA-3 PO), Take by mouth daily in the afternoon. Takes one teaspoon, Disp: , Rfl:    OVER THE COUNTER MEDICATION, Take 1 capsule by mouth daily in the afternoon. Minerals, Disp: , Rfl:    Semaglutide, 1 MG/DOSE, 4 MG/3ML SOPN, Inject 1 mg as directed once a week., Disp: 3 mL, Rfl: 1   Objective:     There were no vitals filed for this visit.    There is no height or weight on file to calculate BMI.    Physical Exam:    ***   Electronically signed by:  Ryan Rowe [facility] 12:19 PM 01/18/23

## 2023-01-19 ENCOUNTER — Ambulatory Visit (INDEPENDENT_AMBULATORY_CARE_PROVIDER_SITE_OTHER): Payer: 59 | Admitting: Sports Medicine

## 2023-01-19 VITALS — BP 122/82 | HR 61 | Ht 70.0 in | Wt 265.0 lb

## 2023-01-19 DIAGNOSIS — M542 Cervicalgia: Secondary | ICD-10-CM

## 2023-01-19 MED ORDER — CYCLOBENZAPRINE HCL 5 MG PO TABS: 5.0000 mg | ORAL_TABLET | Freq: Every day | ORAL | 0 refills | Status: DC

## 2023-01-19 MED ORDER — MELOXICAM 15 MG PO TABS: 15.0000 mg | ORAL_TABLET | Freq: Every day | ORAL | 0 refills | Status: DC

## 2023-01-19 NOTE — Patient Instructions (Signed)
-   Start meloxicam 15 mg daily x2 weeks.  If still having pain after 2 weeks, complete 3rd-week of meloxicam. May use remaining meloxicam as needed once daily for pain control.  Do not to use additional NSAIDs while taking meloxicam.  May use Tylenol [phone] mg 2 to 3 times a day for breakthrough pain. Neck HEP  Flexeril 5 mg nightly as needed for muscle spasm  3-4 week follow up

## 2023-01-25 ENCOUNTER — Ambulatory Visit: Payer: 59 | Admitting: Family Medicine

## 2023-02-13 ENCOUNTER — Other Ambulatory Visit: Payer: Self-pay | Admitting: Sports Medicine

## 2023-04-26 LAB — HM DIABETES EYE EXAM

## 2023-06-01 ENCOUNTER — Other Ambulatory Visit: Payer: Self-pay | Admitting: Physician Assistant

## 2023-06-01 ENCOUNTER — Other Ambulatory Visit (HOSPITAL_BASED_OUTPATIENT_CLINIC_OR_DEPARTMENT_OTHER): Payer: Self-pay | Admitting: *Deleted

## 2023-06-01 ENCOUNTER — Ambulatory Visit (HOSPITAL_BASED_OUTPATIENT_CLINIC_OR_DEPARTMENT_OTHER)
Admission: RE | Admit: 2023-06-01 | Discharge: 2023-06-01 | Disposition: A | Discharge status: Home / Self Care | Payer: 59 | Source: Ambulatory Visit | Attending: *Deleted | Admitting: *Deleted

## 2023-06-01 ENCOUNTER — Encounter (HOSPITAL_BASED_OUTPATIENT_CLINIC_OR_DEPARTMENT_OTHER): Payer: Self-pay

## 2023-06-01 DIAGNOSIS — R1030 Lower abdominal pain, unspecified: Secondary | ICD-10-CM | POA: Insufficient documentation

## 2023-06-01 LAB — POCT I-STAT CREATININE: Creatinine, Ser: 1.2 mg/dL (ref 0.61–1.24)

## 2023-06-01 MED ORDER — IOHEXOL 300 MG/ML  SOLN
100.0000 mL | Freq: Once | INTRAMUSCULAR | Status: AC | PRN
  Administered 2023-06-01: 85 mL via INTRAVENOUS

## 2023-07-26 ENCOUNTER — Other Ambulatory Visit: Payer: Self-pay | Admitting: Physician Assistant

## 2023-07-31 ENCOUNTER — Encounter: Payer: 59 | Admitting: Physician Assistant

## 2023-09-05 ENCOUNTER — Encounter: Payer: Self-pay | Admitting: Physician Assistant

## 2023-09-12 ENCOUNTER — Other Ambulatory Visit: Payer: Self-pay | Admitting: Physician Assistant

## 2023-09-12 NOTE — Telephone Encounter (Unsigned)
 Copied from CRM [id number]. Topic: Clinical - Medication Refill >> Sep 12, 2023  3:42 PM [place] A wrote: Most Recent Primary Care Visit:  Provider: Jarold Motto  Department: [facility]  Visit Type: OFFICE VISIT  Date: 01/11/2023  Medication: OZEMPIC, 1 MG/DOSE, 4 MG/3ML SOPN  Has the patient contacted their pharmacy? Yes (Agent: If no, request that the patient contact the pharmacy for the refill. If patient does not wish to contact the pharmacy document the reason why and proceed with request.) (Agent: If yes, when and what did the pharmacy advise?)  Is this the correct pharmacy for this prescription? Yes If no, delete pharmacy and type the correct one.  This is the patient's preferred pharmacy:  [id number] [place], [place] - [address] AT [facility] [address] [place] [address] Phone: [phone] Fax: [phone]   Has the prescription been filled recently? No  Is the patient out of the medication? Yes  Has the patient been seen for an appointment in the last year OR does the patient have an upcoming appointment? Yes  Can we respond through MyChart? Yes  Agent: Please be advised that Rx refills may take up to 3 business days. We ask that you follow-up with your pharmacy.

## 2023-09-13 MED ORDER — OZEMPIC (1 MG/DOSE) 4 MG/3ML ~~LOC~~ SOPN: 1.0000 mg | PEN_INJECTOR | SUBCUTANEOUS | 2 refills | Status: DC

## 2023-09-15 ENCOUNTER — Other Ambulatory Visit (HOSPITAL_COMMUNITY): Payer: Self-pay

## 2023-09-26 ENCOUNTER — Telehealth: Payer: Self-pay | Admitting: *Deleted

## 2023-09-26 ENCOUNTER — Encounter: Payer: Self-pay | Admitting: Physician Assistant

## 2023-09-26 ENCOUNTER — Telehealth: Payer: Self-pay | Admitting: Physician Assistant

## 2023-09-26 NOTE — Telephone Encounter (Signed)
 Please do PA for Ozempic 1 mg weekly. Pt is diabetic

## 2023-09-26 NOTE — Telephone Encounter (Signed)
 Copied from CRM [id number]. Topic: Clinical - Prescription Issue >> Sep 26, 2023  8:31 AM Saverio Danker wrote: Reason for CRM: Patient is calling in because he hasn't received any information on his Semaglutide, 1 MG/DOSE, (OZEMPIC, 1 MG/DOSE,) 4 MG/3ML SOPN prescription. He isn't able to get his prescription due to the prior authorization not done yet. He is calling for the progress

## 2023-09-26 NOTE — Telephone Encounter (Signed)
 Spoke to pt told him I sent a message to our PA Team to do PA on Ozempic as soon as I hear back from insurance I will let you know. Pt verbalized understanding.

## 2023-09-27 ENCOUNTER — Other Ambulatory Visit (HOSPITAL_COMMUNITY): Payer: Self-pay

## 2023-09-27 ENCOUNTER — Telehealth: Payer: Self-pay

## 2023-09-27 NOTE — Telephone Encounter (Signed)
 PA request has been Submitted. New Encounter has been or will be created for follow up. For additional info see Pharmacy Prior Auth telephone encounter from 09/27/2023.

## 2023-09-27 NOTE — Telephone Encounter (Signed)
 Pharmacy Patient Advocate Encounter  Received notification from [place] that Prior Authorization for Ozempic (1 MG/DOSE) 4MG /3ML pen-injectors has been APPROVED from 08/27/2023 to 09/26/2024   PA #/Case ID/Reference #: [id number]

## 2023-09-27 NOTE — Telephone Encounter (Signed)
 Spoke to pt told him Ozempic has been approved for one year. Pt verbalized understanding and said he just got the text message. Told him okay, have a good evening.

## 2023-09-27 NOTE — Telephone Encounter (Signed)
 Pharmacy Patient Advocate Encounter   Received notification from Pt Calls Messages that prior authorization for Ozempic (1 MG/DOSE) 4MG /3ML pen-injectors is required/requested.   Insurance verification completed.   The patient is insured through [facility] .   Per test claim: PA required; PA submitted to above mentioned insurance via CoverMyMeds Key/confirmation #/EOC [id number] Status is pending

## 2023-09-28 NOTE — Telephone Encounter (Signed)
 PLEASE BE ADVISED APPROVAL LETTER HAS BEEN SCANNED IN MEDIA OF CHART   Melanee Spry CPhT Rx Patient Advocate [phone]) [phone]

## 2023-10-18 ENCOUNTER — Ambulatory Visit (INDEPENDENT_AMBULATORY_CARE_PROVIDER_SITE_OTHER): Payer: Self-pay | Admitting: Physician Assistant

## 2023-10-18 VITALS — BP 134/90 | HR 72 | Temp 98.8°F | Ht 70.0 in | Wt 272.5 lb

## 2023-10-18 DIAGNOSIS — Z7985 Long-term (current) use of injectable non-insulin antidiabetic drugs: Secondary | ICD-10-CM

## 2023-10-18 DIAGNOSIS — R195 Other fecal abnormalities: Secondary | ICD-10-CM | POA: Diagnosis not present

## 2023-10-18 DIAGNOSIS — E119 Type 2 diabetes mellitus without complications: Secondary | ICD-10-CM

## 2023-10-18 DIAGNOSIS — Z0000 Encounter for general adult medical examination without abnormal findings: Secondary | ICD-10-CM

## 2023-10-18 DIAGNOSIS — Z0001 Encounter for general adult medical examination with abnormal findings: Secondary | ICD-10-CM

## 2023-10-18 DIAGNOSIS — E669 Obesity, unspecified: Secondary | ICD-10-CM

## 2023-10-18 DIAGNOSIS — Z13220 Encounter for screening for lipoid disorders: Secondary | ICD-10-CM | POA: Diagnosis not present

## 2023-10-18 DIAGNOSIS — Z6839 Body mass index (BMI) 39.0-39.9, adult: Secondary | ICD-10-CM

## 2023-10-18 DIAGNOSIS — M791 Myalgia, unspecified site: Secondary | ICD-10-CM

## 2023-10-18 DIAGNOSIS — I10 Essential (primary) hypertension: Secondary | ICD-10-CM

## 2023-10-18 DIAGNOSIS — F419 Anxiety disorder, unspecified: Secondary | ICD-10-CM

## 2023-10-18 LAB — LIPID PANEL
Cholesterol: 192 mg/dL (ref 0–200)
HDL: 38.8 mg/dL — ABNORMAL LOW (ref >39.00)
LDL Cholesterol: 142 mg/dL — ABNORMAL HIGH (ref 0–99)
NonHDL: 153.28
Total CHOL/HDL Ratio: 5
Triglycerides: 57 mg/dL (ref 0.0–149.0)
VLDL: 11.4 mg/dL (ref 0.0–40.0)

## 2023-10-18 LAB — MICROALBUMIN / CREATININE URINE RATIO
Creatinine,U: 253.1 mg/dL
Microalb Creat Ratio: 4.4 mg/g (ref 0.0–30.0)
Microalb, Ur: 1.1 mg/dL (ref 0.0–1.9)

## 2023-10-18 LAB — COMPREHENSIVE METABOLIC PANEL WITH GFR
ALT: 18 U/L (ref 0–53)
AST: 20 U/L (ref 0–37)
Albumin: 4.7 g/dL (ref 3.5–5.2)
Alkaline Phosphatase: 46 U/L (ref 39–117)
BUN: 11 mg/dL (ref 6–23)
CO2: 29 meq/L (ref 19–32)
Calcium: 9.5 mg/dL (ref 8.4–10.5)
Chloride: 101 meq/L (ref 96–112)
Creatinine, Ser: 1.07 mg/dL (ref 0.40–1.50)
GFR: 88.18 mL/min (ref >60.00)
Glucose, Bld: 101 mg/dL — ABNORMAL HIGH (ref 70–99)
Potassium: 4 meq/L (ref 3.5–5.1)
Sodium: 136 meq/L (ref 135–145)
Total Bilirubin: 0.5 mg/dL (ref 0.2–1.2)
Total Protein: 8 g/dL (ref 6.0–8.3)

## 2023-10-18 LAB — CBC WITH DIFFERENTIAL/PLATELET
Basophils Absolute: 0 10*3/uL (ref 0.0–0.1)
Basophils Relative: 0.7 % (ref 0.0–3.0)
Eosinophils Absolute: 0 10*3/uL (ref 0.0–0.7)
Eosinophils Relative: 0.8 % (ref 0.0–5.0)
HCT: 45.1 % (ref 39.0–52.0)
Hemoglobin: 14 g/dL (ref 13.0–17.0)
Lymphocytes Relative: 31.1 % (ref 12.0–46.0)
Lymphs Abs: 1.4 10*3/uL (ref 0.7–4.0)
MCHC: 31 g/dL (ref 30.0–36.0)
MCV: 70.9 fl — ABNORMAL LOW (ref 78.0–100.0)
Monocytes Absolute: 0.3 10*3/uL (ref 0.1–1.0)
Monocytes Relative: 7.2 % (ref 3.0–12.0)
Neutro Abs: 2.7 10*3/uL (ref 1.4–7.7)
Neutrophils Relative %: 60.2 % (ref 43.0–77.0)
Platelets: 289 10*3/uL (ref 150.0–400.0)
RBC: 6.36 Mil/uL — ABNORMAL HIGH (ref 4.22–5.81)
RDW: 14.9 % (ref 11.5–15.5)
WBC: 4.5 10*3/uL (ref 4.0–10.5)

## 2023-10-18 LAB — HEMOGLOBIN A1C: Hgb A1c MFr Bld: 6.3 % (ref 4.6–6.5)

## 2023-10-18 NOTE — Patient Instructions (Signed)
 It was great to see you! We will refer you back to Dr Cleora Daft  If stool changes persist after stopping beet juice, let me know   Please go to the lab for blood work.   Our office will call you with your results unless you have chosen to receive results via MyChart.  If your blood work is normal we will follow-up each year for physicals and as scheduled for chronic medical problems.  If anything is abnormal we will treat accordingly and get you in for a follow-up.  Take care,  Miliana Gangwer

## 2023-10-18 NOTE — Progress Notes (Signed)
 Subjective:    Ryan Rowe is a 38 y.o. male and is here for a comprehensive physical exam.  HPI  Health Maintenance Due  Topic Date Due   Pneumococcal Vaccine 33-66 Years old (1 of 2 - PCV) Never done   HEMOGLOBIN A1C  07/14/2023    Acute Concerns: Lower body pain Pt complains of lower back and abdominal pain and intermittent cramping of his right leg since restarting Ozempic . He states he is unsure if his pain is due to Ozempic  or the recent exercise. He notes he does not drink much electrolytes. Denies chest pain, shortness of breath, BLE, or pain stretching his right lower extremity.   Chronic Issues: Weight management Pt is on Ozempic  1 mg injection once a week. Pt has been compliant, but notes intolerance. Pt states he has been drinking more water  and eating a healthier diet. He also states he has started regular exercise 2 weeks ago. Pt notes he had a CT scan done at [facility] on 06/01/23; results were normal. Pt would like to decrease the Ozempic  dosage to see if it is causing his acute lower body pain.  Anxiety Pt is on Buspar  15 mg twice daily but does not take regularly He reports his anxiety as intermittent. He describes episodes as one month long, one month out of a year. He also reports his recent walks have promoted good mental health.  HTN Pt is on Lisinopril  40 mg once daily, good compliance and tolerance. Pt states he has started drinking beet juice and reports red stool. He is unsure if supplement is helping. Pt notes he becomes nervous at the doctor's office and will propose to his girlfriend soon.  Health Maintenance: Immunizations -- See above. Colonoscopy -- N/a PSA --  Lab Results  Component Value Date   PSA 0.55 07/18/2018   Diet -- Overall well-balanced diet; working on eating healthier and cooking at home. Sleep habits -- No acute concerns reported today. Exercise -- Began exercising 2 weeks ago; walks 5 miles.  Weight --   Recent weight history Wt Readings from Last 10 Encounters:  10/18/23 272 lb 8 oz (123.6 kg)  01/19/23 265 lb (120.2 kg)  01/11/23 274 lb 8 oz (124.5 kg)  08/30/22 274 lb 4 oz (124.4 kg)  06/08/22 279 lb 4 oz (126.7 kg)  05/17/22 277 lb 4 oz (125.8 kg)  09/30/21 287 lb 3.2 oz (130.3 kg)  09/24/21 284 lb (128.8 kg)  05/26/21 291 lb 6.1 oz (132.2 kg)  03/18/21 262 lb (118.8 kg)   Body mass index is 39.1 kg/m.  Mood -- Stable. Alcohol use --  reports current alcohol use of about 1.0 standard drink of alcohol per week.  Tobacco use --  Tobacco Use: Low Risk  (10/18/2023)   Patient History    Smoking Tobacco Use: Never    Smokeless Tobacco Use: Never    Passive Exposure: Not on file    Eligible for Low Dose CT? no  UTD with eye doctor? Yes. UTD with dentist? Not recently.     10/18/2023    1:02 PM  Depression screen PHQ 2/9  Decreased Interest 0  Down, Depressed, Hopeless 0  PHQ - 2 Score 0  Altered sleeping 0  Tired, decreased energy 0  Change in appetite 0  Feeling bad or failure about yourself  0  Trouble concentrating 0  Moving slowly or fidgety/restless 0  Suicidal thoughts 0  PHQ-9 Score 0  Difficult doing work/chores Not difficult at all  Other providers/specialists: Patient Care Team: Alexander Iba, [place] as PCP - General (Physician Assistant)    PMHx, SurgHx, SocialHx, Medications, and Allergies were reviewed in the Visit Navigator and updated as appropriate.   Past Medical History:  Diagnosis Date   Anxiety 2019   Diabetes (HCC)    GERD (gastroesophageal reflux disease)    Hypertension      Past Surgical History:  Procedure Laterality Date   FRACTURE SURGERY Right    wrist     Family History  Problem Relation Age of Onset   Depression Mother    Diabetes Mother    Breast cancer Mother        in remission   Cancer Mother    Hypertension Father    Diabetes Father    Other Brother        prediabetes   ALS Maternal Grandfather     Diabetes Maternal Grandfather    Hypertension Paternal Grandmother    Hypertension Paternal Grandfather    Cancer Maternal Aunt        breast   Cancer Maternal Aunt        breast   Heart disease Cousin 50   Diabetes Other    CAD Maternal Uncle 47   Stroke Neg Hx     Social History   Tobacco Use   Smoking status: Never   Smokeless tobacco: Never  Substance Use Topics   Alcohol use: Yes    Alcohol/week: 1.0 standard drink of alcohol    Types: 1 Standard drinks or equivalent per week    Comment: Socially   Drug use: No    Review of Systems:   Review of Systems  Constitutional:  Negative for chills, fever, malaise/fatigue and weight loss.  HENT:  Negative for hearing loss, sinus pain and sore throat.   Respiratory:  Negative for cough and hemoptysis.   Cardiovascular:  Negative for chest pain, palpitations, leg swelling and PND.  Gastrointestinal:  Negative for abdominal pain, constipation, diarrhea, heartburn, nausea and vomiting.  Genitourinary:  Negative for dysuria, frequency and urgency.  Musculoskeletal:  Negative for back pain, myalgias and neck pain.  Skin:  Negative for itching and rash.  Neurological:  Negative for dizziness, tingling, seizures and headaches.  Endo/Heme/Allergies:  Negative for polydipsia.  Psychiatric/Behavioral:  Negative for depression. The patient is not nervous/anxious.       Objective:    Vitals:   10/18/23 1255 10/18/23 1329  BP: (!) 132/90 (!) 134/90  Pulse: 72   Temp: 98.8 F (37.1 C)   SpO2: 98%     Body mass index is 39.1 kg/m.  General  Alert, cooperative, no distress, appears stated age  Head:  Normocephalic, without obvious abnormality, atraumatic  Eyes:  PERRL, conjunctiva/corneas clear, EOM's intact, fundi benign, both eyes       Ears:  Normal TM's and external ear canals, both ears  Nose: Nares normal, septum midline, mucosa normal, no drainage or sinus tenderness  Throat: Lips, mucosa, and tongue normal; teeth and  gums normal  Neck: Supple, symmetrical, trachea midline, no adenopathy;     thyroid :  No enlargement/tenderness/nodules; no carotid bruit or JVD  Back:   Symmetric, no curvature, ROM normal, no CVA tenderness  Lungs:   Clear to auscultation bilaterally, respirations unlabored  Chest wall:  No tenderness or deformity  Heart:  Regular rate and rhythm, S1 and S2 normal, no murmur, rub or gallop  Abdomen:   Soft, non-tender, bowel sounds active all four quadrants, no masses, no  organomegaly  Extremities: Extremities normal, atraumatic, no cyanosis or edema  Prostate : Deferred  Skin: Skin color, texture, turgor normal, no rashes or lesions  Lymph nodes: Cervical, supraclavicular, and axillary nodes normal  Neurologic: CNII-XII grossly intact. Normal strength, sensation and reflexes throughout   AssessmentPlan:   Routine physical examination Today patient counseled on age appropriate routine health concerns for screening and prevention, each reviewed and up to date or declined. Immunizations reviewed and up to date or declined. Labs ordered and reviewed. Risk factors for depression reviewed and negative. Hearing function and visual acuity are intact. ADLs screened and addressed as needed. Functional ability and level of safety reviewed and appropriate. Education, counseling and referrals performed based on assessed risks today. Patient provided with a copy of personalized plan for preventive services.  Diabetes mellitus without complication (HCC) Update Hemoglobin A1c and provide recommendations He is interested in decreasing Ozempic  if poss -- just to see if this is contributing to any of his body pain  Essential hypertension Above goal today No evidence of end-organ damage on my exam Recommend patient monitor home blood pressure at least a few times weekly Continue lisinopril  40 mg daily If home monitoring shows consistent elevation, or any symptom(s) develop, recommend reach out to us  for  further advice on next steps  Obesity (BMI 35.0-39.9 without comorbidity) Continue efforts at healthy lifestyle He is not interested in increasing ozempic   Myalgia Unclear etiology No red flags He does have concerns that this is related to working out We will update blood work today to assess liver/kidney function and electrolytes Consider reduction of Ozempic  in case this is playing a role If persists, will refer to sports medicine  Anxiety Denies concerns Continue to monitor  Red stool Discussed stopping beet juice If persists, I asked him to let me know and we will refer to gastroenterology Denies any other gastrointestinal red flag symptom(s) -- if these arise, I asked him to let me know   Follow up in 3-6 month(s), sooner if concerns   I, Timoteo Force, acting as a Neurosurgeon for [employer], [place]., have documented all relevant documentation on the behalf of Alexander Iba, [place], as directed by  Alexander Iba, PA while in the presence of Alexander Iba, [place].  I, Alexander Iba, [place], have reviewed all documentation for this visit. The documentation on 10/18/23 for the exam, diagnosis, procedures, and orders are all accurate and complete.  Alexander Iba, PA-C [facility] [facility]

## 2023-10-19 ENCOUNTER — Encounter: Payer: Self-pay | Admitting: Physician Assistant

## 2023-10-25 ENCOUNTER — Other Ambulatory Visit: Payer: Self-pay

## 2023-10-25 ENCOUNTER — Encounter (HOSPITAL_BASED_OUTPATIENT_CLINIC_OR_DEPARTMENT_OTHER): Payer: Self-pay

## 2023-10-25 ENCOUNTER — Emergency Department (HOSPITAL_BASED_OUTPATIENT_CLINIC_OR_DEPARTMENT_OTHER)
Admission: EM | Admit: 2023-10-25 | Discharge: 2023-10-25 | Disposition: A | Discharge status: Home / Self Care | Attending: Emergency Medicine | Admitting: Emergency Medicine

## 2023-10-25 DIAGNOSIS — Z79899 Other long term (current) drug therapy: Secondary | ICD-10-CM | POA: Insufficient documentation

## 2023-10-25 DIAGNOSIS — Z7984 Long term (current) use of oral hypoglycemic drugs: Secondary | ICD-10-CM | POA: Insufficient documentation

## 2023-10-25 DIAGNOSIS — R1030 Lower abdominal pain, unspecified: Secondary | ICD-10-CM | POA: Diagnosis not present

## 2023-10-25 DIAGNOSIS — I10 Essential (primary) hypertension: Secondary | ICD-10-CM | POA: Insufficient documentation

## 2023-10-25 DIAGNOSIS — M545 Low back pain, unspecified: Secondary | ICD-10-CM | POA: Diagnosis present

## 2023-10-25 DIAGNOSIS — E119 Type 2 diabetes mellitus without complications: Secondary | ICD-10-CM | POA: Insufficient documentation

## 2023-10-25 LAB — COMPREHENSIVE METABOLIC PANEL WITH GFR
ALT: 19 U/L (ref 0–44)
AST: 21 U/L (ref 15–41)
Albumin: 4.6 g/dL (ref 3.5–5.0)
Alkaline Phosphatase: 50 U/L (ref 38–126)
Anion gap: 9 (ref 5–15)
BUN: 13 mg/dL (ref 6–20)
CO2: 27 mmol/L (ref 22–32)
Calcium: 9.8 mg/dL (ref 8.9–10.3)
Chloride: 102 mmol/L (ref 98–111)
Creatinine, Ser: 1.13 mg/dL (ref 0.61–1.24)
GFR, Estimated: >60 mL/min (ref >60)
Glucose, Bld: 99 mg/dL (ref 70–99)
Potassium: 4.2 mmol/L (ref 3.5–5.1)
Sodium: 138 mmol/L (ref 135–145)
Total Bilirubin: 0.2 mg/dL (ref 0.0–1.2)
Total Protein: 7.4 g/dL (ref 6.5–8.1)

## 2023-10-25 LAB — CBC WITH DIFFERENTIAL/PLATELET
Abs Immature Granulocytes: 0.01 10*3/uL (ref 0.00–0.07)
Basophils Absolute: 0.1 10*3/uL (ref 0.0–0.1)
Basophils Relative: 1 %
Eosinophils Absolute: 0.1 10*3/uL (ref 0.0–0.5)
Eosinophils Relative: 2 %
HCT: 45.7 % (ref 39.0–52.0)
Hemoglobin: 14.2 g/dL (ref 13.0–17.0)
Immature Granulocytes: 0 %
Lymphocytes Relative: 28 %
Lymphs Abs: 1.4 10*3/uL (ref 0.7–4.0)
MCH: 22.3 pg — ABNORMAL LOW (ref 26.0–34.0)
MCHC: 31.1 g/dL (ref 30.0–36.0)
MCV: 71.9 fL — ABNORMAL LOW (ref 80.0–100.0)
Monocytes Absolute: 0.4 10*3/uL (ref 0.1–1.0)
Monocytes Relative: 8 %
Neutro Abs: 3.1 10*3/uL (ref 1.7–7.7)
Neutrophils Relative %: 61 %
Platelets: 288 10*3/uL (ref 150–400)
RBC: 6.36 MIL/uL — ABNORMAL HIGH (ref 4.22–5.81)
RDW: 16.1 % — ABNORMAL HIGH (ref 11.5–15.5)
WBC: 5.1 10*3/uL (ref 4.0–10.5)
nRBC: 0 % (ref 0.0–0.2)

## 2023-10-25 LAB — LIPASE, BLOOD: Lipase: 51 U/L (ref 11–51)

## 2023-10-25 MED ORDER — DICYCLOMINE HCL 20 MG PO TABS: 20.0000 mg | ORAL_TABLET | Freq: Two times a day (BID) | ORAL | 0 refills | Status: DC

## 2023-10-25 NOTE — ED Provider Notes (Addendum)
 [place] EMERGENCY DEPARTMENT AT [facility] Provider Note   CSN: [id number] Arrival date & time: 10/25/23  1147     History  Chief Complaint  Patient presents with   Back Pain    Ryan Rowe is a 38 y.o. male with history of diabetes and hypertension, presents with concern for lower back pain that started approximately 1 week ago.  He denies any injuries to the area.  Reports pain is fairly constant and will sometimes wrap around into his lower abdomen on both sides.  Denies any weakness or paresthesias in his lower extremities.  Denies any fevers, bowel or bladder incontinence, saddle anesthesia, or history of malignancy.  Denies any nausea, vomiting, diarrhea, dysuria, hematuria. Reports normal bowel movements.  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  HPI     Home Medications Prior to Admission medications   Medication Sig Start Date End Date Taking? Authorizing Provider  dicyclomine (BENTYL) 20 MG tablet Take 1 tablet (20 mg total) by mouth 2 (two) times daily. 10/25/23  Yes Rexie Catena, PA-C  lisinopril  (ZESTRIL ) 40 MG tablet TAKE 1 TABLET(40 MG) BY MOUTH DAILY 06/05/23  Yes Alexander Iba, PA  Semaglutide , 1 MG/DOSE, (OZEMPIC , 1 MG/DOSE,) 4 MG/3ML SOPN Inject 1 mg into the skin once a week. 09/13/23  Yes Alexander Iba, PA  busPIRone  (BUSPAR ) 15 MG tablet Take 1 tablet (15 mg total) by mouth 2 (two) times daily as needed. 08/30/22   Alexander Iba, PA  Omega-3 Fatty Acids (FISH OIL OMEGA-3 PO) Take by mouth daily in the afternoon. Takes one teaspoon    [provider]  OVER THE COUNTER MEDICATION Take 1 capsule by mouth daily in the afternoon. Minerals    [provider]      Allergies    Patient has no known allergies.    Review of Systems   Review of Systems  Constitutional:  Negative for fever.  Gastrointestinal:  Positive for abdominal pain.    Physical Exam Updated Vital Signs BP (!) 146/105    Pulse 73   Temp 98.7 F (37.1 C)   Resp 16   Ht 5\' 10"  (1.778 m)   Wt 123.4 kg   SpO2 100%   BMI 39.03 kg/m  Physical Exam Vitals and nursing note reviewed.  Constitutional:      General: He is not in acute distress.    Appearance: He is well-developed.  HENT:     Head: Normocephalic and atraumatic.  Eyes:     Conjunctiva/sclera: Conjunctivae normal.  Cardiovascular:     Rate and Rhythm: Normal rate and regular rhythm.     Heart sounds: No murmur heard.    Comments: 2+ dorsalis pedis pulse bilaterally Pulmonary:     Effort: Pulmonary effort is normal. No respiratory distress.     Breath sounds: Normal breath sounds.  Abdominal:     Palpations: Abdomen is soft.     Tenderness: There is abdominal tenderness. There is no right CVA tenderness or left CVA tenderness.     Hernia: No hernia is present.     Comments: Mild tenderness in the lower abdomen without rebound or guarding  Musculoskeletal:        General: No swelling.     Cervical back: Neck supple.     Comments: No cervical, thoracic, or lumbar spinal tenderness palpation No tenderness palpation of the musculature of the back diffusely, bilaterally  Moves all extremities without difficulty, walks without difficulty  Skin:  General: Skin is warm and dry.     Capillary Refill: Capillary refill takes less than 2 seconds.  Neurological:     Mental Status: He is alert.     Comments: 5/5 strength with resisted hip flexion, knee flexion and extension, ankle plantarflexion and dorsiflexion bilaterally  Intact sensation in the bilateral lower extremities  Psychiatric:        Mood and Affect: Mood normal.     ED Results / Procedures / Treatments   Labs (all labs ordered are listed, but only abnormal results are displayed) Labs Reviewed  CBC WITH DIFFERENTIAL/PLATELET - Abnormal; Notable for the following components:      Result Value   RBC 6.36 (*)    MCV 71.9 (*)    MCH 22.3 (*)    RDW 16.1 (*)    All other  components within normal limits  COMPREHENSIVE METABOLIC PANEL WITH GFR  LIPASE, BLOOD    EKG None  Radiology No results found.  Procedures Procedures    Medications Ordered in ED Medications - No data to display  ED Course/ Medical Decision Making/ A&P                                 Medical Decision Making Amount and/or Complexity of Data Reviewed Labs: ordered.  Risk Prescription drug management.     Differential diagnosis includes but is not limited to Musculoskeletal pain, radiculopathy, spinal stenosis, herniated nucleus pulposis, fracture, cauda equina, epidural abscess, shingles, nephrolithiasis, appendicitis, constipation, testicular torsion   ED Course:  Upon initial evaluation, patient is well appearing, stable vital signs. No spinal tenderness or history of injury. Nontender of the musculature of the lower back.  Low concern for musculoskeletal pain at this time.  No radicular symptoms that be concerning for herniated nucleus pulposus or other etiology.  No red flag back symptoms to warrant imaging at this time.  Reporting some pain in the lower abdomen, will evaluate basic labs.   Labs Ordered: I Ordered, and personally interpreted labs.  The pertinent results include:   CBC without leukocytosis CMP and lipase within normal limits  Upon re-evaluation, patient still well-appearing, stable vital signs.  Discussed that labs were overall reassuring.  He states that he thinks this is gas pain as he will feel it is in his stomach and it will resolve with bowel movements.  Given abdomen soft nontender and labs unremarkable, no concern for acute intra-abdominal pathology at this time.  Stable and appropriate for discharge home    Impression: Lower back/lower abdominal pain  Disposition:  The patient was discharged home with instructions to eat fiber filled diet and drink plenty water  to maintain normal bowel movements.  Bentyl prescribed to take as needed for  abdominal cramping.  Follow-up with PCP if symptoms not improving within the next week. Return precautions given.    This chart was dictated using voice recognition software, Dragon. Despite the best efforts of this provider to proofread and correct errors, errors may still occur which can change documentation meaning.          Final Clinical Impression(s) / ED Diagnoses Final diagnoses:  Lower abdominal pain    Rx / DC Orders ED Discharge Orders          Ordered    dicyclomine (BENTYL) 20 MG tablet  2 times daily        10/25/23 1411  Rexie Catena, PA-C 10/25/23 1415    Rexie Catena, PA-C 10/25/23 1425    Sueellen Emery, MD 10/25/23 1549

## 2023-10-25 NOTE — ED Triage Notes (Signed)
 In for eval of lower back pain.

## 2023-10-25 NOTE — Discharge Instructions (Addendum)
 Your lab work is reassuring today.  Your kidney, liver, and pancreas labs were normal. No signs of infection on your labs.  Please continue to eat a fiber filled diet and drink plenty water  to help with normal bowel movements.  You have been prescribed a medication called Bentyl to help with your abdominal cramping.  Please follow-up with your PCP if your symptoms do not start to improve within the next week.  Return to the ER for any worsening abdominal pain, vomiting, fevers, any other new or concerning symptoms

## 2023-11-28 ENCOUNTER — Other Ambulatory Visit: Payer: Self-pay | Admitting: Physician Assistant

## 2024-05-09 LAB — OPHTHALMOLOGY REPORT-SCANNED

## 2024-05-29 ENCOUNTER — Other Ambulatory Visit: Payer: Self-pay | Admitting: Physician Assistant

## 2024-07-07 ENCOUNTER — Other Ambulatory Visit: Payer: Self-pay | Admitting: Physician Assistant

## 2024-07-09 ENCOUNTER — Ambulatory Visit: Admitting: Physician Assistant

## 2024-07-09 VITALS — BP 144/98 | HR 88 | Temp 98.8°F | Ht 70.0 in | Wt 288.0 lb

## 2024-07-09 DIAGNOSIS — Z7985 Long-term (current) use of injectable non-insulin antidiabetic drugs: Secondary | ICD-10-CM

## 2024-07-09 DIAGNOSIS — E119 Type 2 diabetes mellitus without complications: Secondary | ICD-10-CM

## 2024-07-09 DIAGNOSIS — M545 Low back pain, unspecified: Secondary | ICD-10-CM

## 2024-07-09 DIAGNOSIS — R1031 Right lower quadrant pain: Secondary | ICD-10-CM

## 2024-07-09 DIAGNOSIS — I10 Essential (primary) hypertension: Secondary | ICD-10-CM | POA: Diagnosis not present

## 2024-07-09 LAB — COMPREHENSIVE METABOLIC PANEL WITH GFR
ALT: 29 U/L (ref 3–53)
AST: 23 U/L (ref 5–37)
Albumin: 4.8 g/dL (ref 3.5–5.2)
Alkaline Phosphatase: 45 U/L (ref 39–117)
BUN: 17 mg/dL (ref 6–23)
CO2: 27 meq/L (ref 19–32)
Calcium: 9.7 mg/dL (ref 8.4–10.5)
Chloride: 99 meq/L (ref 96–112)
Creatinine, Ser: 1.09 mg/dL (ref 0.40–1.50)
GFR: 85.8 mL/min
Glucose, Bld: 123 mg/dL — ABNORMAL HIGH (ref 70–99)
Potassium: 4.2 meq/L (ref 3.5–5.1)
Sodium: 136 meq/L (ref 135–145)
Total Bilirubin: 0.5 mg/dL (ref 0.2–1.2)
Total Protein: 8.3 g/dL (ref 6.0–8.3)

## 2024-07-09 LAB — POCT URINALYSIS DIPSTICK
Bilirubin, UA: NEGATIVE
Blood, UA: NEGATIVE
Glucose, UA: NEGATIVE
Leukocytes, UA: NEGATIVE
Nitrite, UA: NEGATIVE
Protein, UA: NEGATIVE
Spec Grav, UA: 1.025
Urobilinogen, UA: 0.2 U/dL
pH, UA: 6

## 2024-07-09 LAB — HEMOGLOBIN A1C: Hgb A1c MFr Bld: 6.5 % (ref 4.6–6.5)

## 2024-07-09 MED ORDER — OZEMPIC (0.25 OR 0.5 MG/DOSE) 2 MG/3ML ~~LOC~~ SOPN: 0.5000 mg | PEN_INJECTOR | SUBCUTANEOUS | 1 refills | Status: AC

## 2024-07-09 MED ORDER — MELOXICAM 15 MG PO TABS: 15.0000 mg | ORAL_TABLET | Freq: Every day | ORAL | 0 refills | Status: AC

## 2024-07-09 NOTE — Progress Notes (Signed)
 "  History of Present Illness:   Chief Complaint  Patient presents with   Abdominal Pain    Pt c/o low abdominal pain radiating into groin and low back pain. Denies fever or chills.    Discussed the use of AI scribe software for clinical note transcription with the patient, who gave verbal consent to proceed.  History of Present Illness   Ryan Rowe is a 39 year old male who presents with discomfort in the lower back and right testicle after moving.  He has had lower back and right testicular discomfort for about two weeks after his recent move. The sensation is lingering rather than sharp pain and is worse with certain movements, especially bending. It is slowly improving but noticeable at night when he is winding down. Discomfort is intermittent and travels. He denies pain with urination, concerns for STDs, or palpable lumps in the groin, although he feels like something is there. Ibuprofen  provides some relief.  He takes lisinopril  40 mg daily for blood pressure but is not monitoring his blood pressure at home. Reports there is no headache(s), chest pain, shortness of breath.  He has not been taking his Ozempic  1 mg weekly dosage that was last prescribed -- did not like the way that it made him feel.  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        Past Medical History:  Diagnosis Date   Anxiety 2019   Diabetes (HCC)    GERD (gastroesophageal reflux disease)    Hypertension      Social History[1]  Past Surgical History:  Procedure Laterality Date   FRACTURE SURGERY Right    wrist    Family History  Problem Relation Age of Onset   Depression Mother    Diabetes Mother    Breast cancer Mother        in remission   Cancer Mother    Hypertension Father    Diabetes Father    Other Brother        prediabetes   ALS Maternal Grandfather    Diabetes Maternal Grandfather    Hypertension Paternal Grandmother    Hypertension Paternal  Grandfather    Cancer Maternal Aunt        breast   Cancer Maternal Aunt        breast   Heart disease Cousin 50   Diabetes Other    CAD Maternal Uncle 60   Stroke Neg Hx     Allergies[2]  Current Medications:  Current Medications[3]   Review of Systems:   Negative unless otherwise specified per HPI.  Vitals:   Vitals:   07/09/24 1133 07/09/24 1201  BP: (!) 146/98 (!) 144/98  Pulse: 88   Temp: 98.8 F (37.1 C)   TempSrc: Temporal   SpO2: 98%   Weight: 288 lb (130.6 kg)   Height: 5' 10 (1.778 m)      Body mass index is 41.32 kg/m.  Physical Exam:   Physical Exam Vitals and nursing note reviewed. Exam conducted with a chaperone present.  Constitutional:      General: He is not in acute distress.    Appearance: He is well-developed. He is not ill-appearing or toxic-appearing.  Cardiovascular:     Rate and Rhythm: Normal rate and regular rhythm.     Pulses: Normal pulses.     Heart sounds: Normal heart sounds, S1 normal and S2 normal.  Pulmonary:     Effort: Pulmonary effort is normal.  Breath sounds: Normal breath sounds.  Abdominal:     Hernia: There is no hernia in the left inguinal area or right inguinal area.  Lymphadenopathy:     Lower Body: No right inguinal adenopathy. No left inguinal adenopathy.  Skin:    General: Skin is warm and dry.  Neurological:     Mental Status: He is alert.     GCS: GCS eye subscore is 4. GCS verbal subscore is 5. GCS motor subscore is 6.  Psychiatric:        Speech: Speech normal.        Behavior: Behavior normal. Behavior is cooperative.     Assessment and Plan:   Assessment and Plan    Right groin pain Muscle strain likely from recent physical activity. Symptoms improving but persistent. No hernia or significant bulge. - Provided exercises for muscle strengthening. - Advised against lifting until symptoms improve. - Prescribed meloxicam  once daily for one week, then as needed. - Will likely refer to sports  medicine if ongoing despite treatment  Diabetes mellitus without complication (HCC)  Management with Ozempic . Previous 1 mg dose not tolerated. Plan to restart at 0.5 mg. Blood work needed for A1c assessment. - Restarted Ozempic  at 0.5 mg. - Ordered blood work to assess A1c levels.  Essential hypertension Managed with lisinopril . Elevated blood pressure during visit, possibly due to pain or other factors. No home monitoring reported. - Continue lisinopril . - Encouraged home blood pressure monitoring.  - recommend add hydrochloroTHIAZIDE if remains elevated at follow up         Lucie Buttner, PA-C     [1]  Social History Tobacco Use   Smoking status: Never   Smokeless tobacco: Never  Vaping Use   Vaping status: Never Used  Substance Use Topics   Alcohol use: Yes    Alcohol/week: 1.0 standard drink of alcohol    Types: 1 Standard drinks or equivalent per week    Comment: Socially   Drug use: No  [2] No Known Allergies [3]  Current Outpatient Medications:    lisinopril  (ZESTRIL ) 40 MG tablet, TAKE 1 TABLET(40 MG) BY MOUTH DAILY, Disp: 30 tablet, Rfl: 0   meloxicam  (MOBIC ) 15 MG tablet, Take 1 tablet (15 mg total) by mouth daily., Disp: 30 tablet, Rfl: 0   Omega-3 Fatty Acids (FISH OIL OMEGA-3 PO), Take by mouth daily in the afternoon. Takes one teaspoon, Disp: , Rfl:    OVER THE COUNTER MEDICATION, Take 1 capsule by mouth daily in the afternoon. Minerals, Disp: , Rfl:    Semaglutide ,0.25 or 0.5MG /DOS, (OZEMPIC , 0.25 OR 0.5 MG/DOSE,) 2 MG/3ML SOPN, Inject 0.5 mg into the skin once a week., Disp: 3 mL, Rfl: 1  "

## 2024-07-09 NOTE — Patient Instructions (Signed)
 It was great to see you!  Start meloxicam  15 mg daily x 1 week, then take as needed after that  Follow up in 3 months  Take care,  Lucie Buttner PA-C

## 2024-07-10 ENCOUNTER — Ambulatory Visit: Payer: Self-pay | Admitting: Physician Assistant

## 2024-07-10 ENCOUNTER — Ambulatory Visit: Admitting: Physician Assistant

## 2024-07-10 LAB — URINE CULTURE
MICRO NUMBER:: 17462100
Result:: NO GROWTH
SPECIMEN QUALITY:: ADEQUATE

## 2024-07-11 ENCOUNTER — Other Ambulatory Visit (HOSPITAL_COMMUNITY): Payer: Self-pay

## 2024-07-11 ENCOUNTER — Telehealth: Payer: Self-pay

## 2024-07-11 NOTE — Telephone Encounter (Signed)
 Pharmacy Patient Advocate Encounter   Received notification from [facility] KEY that prior authorization for Ozempic  (0.25 or 0.5 MG/DOSE) 2MG /3ML pen-injectors is required/requested.   Insurance verification completed.   The patient is insured through [facility] [employer]   Per test claim: PA required; PA submitted to above mentioned insurance via Latent Key/confirmation #/EOC [id number] Status is pending

## 2024-07-15 ENCOUNTER — Other Ambulatory Visit (HOSPITAL_COMMUNITY): Payer: Self-pay

## 2024-07-15 NOTE — Telephone Encounter (Signed)
 Pharmacy Patient Advocate Encounter  Received notification from [facility] that Prior Authorization for  Ozempic  (0.25 or 0.5 MG/DOSE) 2MG /3ML pen-injectors  has been APPROVED from 07/11/24 to 07/11/25. Ran test claim, Copay is $49.99. This test claim was processed through [facility]- copay amounts may vary at other pharmacies due to pharmacy/plan contracts, or as the patient moves through the different stages of their insurance plan.   PA #/Case ID/Reference #: [id number]
